# Patient Record
Sex: Male | Born: 1975 | Race: White | Hispanic: No | Marital: Married | Smoking: Never smoker
Health system: Southern US, Community
[De-identification: ages and names within clinical notes are randomized; demographics above are authoritative.]

## PROBLEM LIST (undated history)

## (undated) DIAGNOSIS — E119 Type 2 diabetes mellitus without complications: Secondary | ICD-10-CM

## (undated) DIAGNOSIS — I10 Essential (primary) hypertension: Secondary | ICD-10-CM

## (undated) DIAGNOSIS — K219 Gastro-esophageal reflux disease without esophagitis: Secondary | ICD-10-CM

## (undated) DIAGNOSIS — R51 Headache: Secondary | ICD-10-CM

## (undated) DIAGNOSIS — S46211A Strain of muscle, fascia and tendon of other parts of biceps, right arm, initial encounter: Secondary | ICD-10-CM

## (undated) DIAGNOSIS — M1990 Unspecified osteoarthritis, unspecified site: Secondary | ICD-10-CM

## (undated) HISTORY — DX: Gastro-esophageal reflux disease without esophagitis: K21.9

## (undated) HISTORY — PX: ELBOW SURGERY: SHX618

## (undated) HISTORY — DX: Type 2 diabetes mellitus without complications: E11.9

## (undated) HISTORY — DX: Headache: R51

## (undated) HISTORY — DX: Essential (primary) hypertension: I10

## (undated) HISTORY — DX: Strain of muscle, fascia and tendon of other parts of biceps, right arm, initial encounter: S46.211A

## (undated) HISTORY — PX: VASECTOMY: SHX75

---

## 1999-01-29 ENCOUNTER — Emergency Department (HOSPITAL_COMMUNITY): Admission: EM | Admit: 1999-01-29 | Discharge: 1999-01-29 | Payer: Self-pay | Admitting: Internal Medicine

## 1999-02-03 ENCOUNTER — Emergency Department (HOSPITAL_COMMUNITY): Admission: EM | Admit: 1999-02-03 | Discharge: 1999-02-03 | Payer: Self-pay | Admitting: Emergency Medicine

## 1999-10-03 ENCOUNTER — Encounter: Payer: Self-pay | Admitting: Emergency Medicine

## 1999-10-03 ENCOUNTER — Inpatient Hospital Stay (HOSPITAL_COMMUNITY): Admission: EM | Admit: 1999-10-03 | Discharge: 1999-10-06 | Payer: Self-pay | Admitting: Emergency Medicine

## 1999-10-11 ENCOUNTER — Inpatient Hospital Stay (HOSPITAL_COMMUNITY): Admission: EM | Admit: 1999-10-11 | Discharge: 1999-10-13 | Payer: Self-pay | Admitting: *Deleted

## 1999-10-12 ENCOUNTER — Encounter: Payer: Self-pay | Admitting: Pulmonary Disease

## 2001-04-15 ENCOUNTER — Emergency Department (HOSPITAL_COMMUNITY)
Admission: EM | Admit: 2001-04-15 | Discharge: 2001-04-15 | Payer: Self-pay | Admitting: Thoracic Surgery (Cardiothoracic Vascular Surgery)

## 2001-09-07 ENCOUNTER — Emergency Department (HOSPITAL_COMMUNITY): Admission: EM | Admit: 2001-09-07 | Discharge: 2001-09-07 | Payer: Self-pay | Admitting: Emergency Medicine

## 2004-10-11 ENCOUNTER — Ambulatory Visit: Payer: Self-pay | Admitting: Family Medicine

## 2004-12-20 ENCOUNTER — Ambulatory Visit: Payer: Self-pay | Admitting: Internal Medicine

## 2005-12-26 ENCOUNTER — Ambulatory Visit: Payer: Self-pay | Admitting: Internal Medicine

## 2007-04-07 ENCOUNTER — Ambulatory Visit: Payer: Self-pay | Admitting: Internal Medicine

## 2007-04-07 ENCOUNTER — Telehealth (INDEPENDENT_AMBULATORY_CARE_PROVIDER_SITE_OTHER): Payer: Self-pay | Admitting: *Deleted

## 2007-04-07 DIAGNOSIS — K219 Gastro-esophageal reflux disease without esophagitis: Secondary | ICD-10-CM

## 2007-04-07 DIAGNOSIS — I10 Essential (primary) hypertension: Secondary | ICD-10-CM | POA: Insufficient documentation

## 2007-04-07 LAB — CONVERTED CEMR LAB: Rapid Strep: POSITIVE

## 2007-06-16 ENCOUNTER — Ambulatory Visit: Payer: Self-pay | Admitting: Internal Medicine

## 2007-06-16 DIAGNOSIS — R51 Headache: Secondary | ICD-10-CM

## 2007-06-16 DIAGNOSIS — R519 Headache, unspecified: Secondary | ICD-10-CM | POA: Insufficient documentation

## 2007-06-19 ENCOUNTER — Ambulatory Visit: Payer: Self-pay | Admitting: Internal Medicine

## 2007-06-19 ENCOUNTER — Telehealth (INDEPENDENT_AMBULATORY_CARE_PROVIDER_SITE_OTHER): Payer: Self-pay | Admitting: *Deleted

## 2007-07-01 LAB — CONVERTED CEMR LAB
ALT: 36 units/L (ref 0–53)
AST: 27 units/L (ref 0–37)
BUN: 18 mg/dL (ref 6–23)
Basophils Absolute: 0 10*3/uL (ref 0.0–0.1)
Cholesterol: 206 mg/dL (ref 0–200)
GFR calc Af Amer: 99 mL/min
Hemoglobin: 15.5 g/dL (ref 13.0–17.0)
Lymphocytes Relative: 40.1 % (ref 12.0–46.0)
MCHC: 35.1 g/dL (ref 30.0–36.0)
Monocytes Absolute: 0.7 10*3/uL (ref 0.2–0.7)
Monocytes Relative: 13.4 % — ABNORMAL HIGH (ref 3.0–11.0)
Neutro Abs: 2.1 10*3/uL (ref 1.4–7.7)
Potassium: 4 meq/L (ref 3.5–5.1)

## 2007-10-12 ENCOUNTER — Telehealth (INDEPENDENT_AMBULATORY_CARE_PROVIDER_SITE_OTHER): Payer: Self-pay | Admitting: *Deleted

## 2007-10-27 ENCOUNTER — Ambulatory Visit: Payer: Self-pay | Admitting: Internal Medicine

## 2007-11-06 ENCOUNTER — Emergency Department (HOSPITAL_COMMUNITY): Admission: EM | Admit: 2007-11-06 | Discharge: 2007-11-06 | Payer: Self-pay | Admitting: Emergency Medicine

## 2007-11-10 ENCOUNTER — Encounter (INDEPENDENT_AMBULATORY_CARE_PROVIDER_SITE_OTHER): Payer: Self-pay | Admitting: *Deleted

## 2007-11-23 ENCOUNTER — Telehealth (INDEPENDENT_AMBULATORY_CARE_PROVIDER_SITE_OTHER): Payer: Self-pay | Admitting: *Deleted

## 2008-02-01 ENCOUNTER — Encounter (INDEPENDENT_AMBULATORY_CARE_PROVIDER_SITE_OTHER): Payer: Self-pay | Admitting: *Deleted

## 2008-03-19 ENCOUNTER — Emergency Department (HOSPITAL_COMMUNITY): Admission: EM | Admit: 2008-03-19 | Discharge: 2008-03-19 | Payer: Self-pay | Admitting: Emergency Medicine

## 2008-07-04 ENCOUNTER — Telehealth (INDEPENDENT_AMBULATORY_CARE_PROVIDER_SITE_OTHER): Payer: Self-pay | Admitting: *Deleted

## 2008-07-05 ENCOUNTER — Encounter (INDEPENDENT_AMBULATORY_CARE_PROVIDER_SITE_OTHER): Payer: Self-pay | Admitting: *Deleted

## 2008-07-29 HISTORY — PX: HEMORRHOID SURGERY: SHX153

## 2008-08-08 ENCOUNTER — Ambulatory Visit: Payer: Self-pay | Admitting: Family Medicine

## 2008-09-06 ENCOUNTER — Ambulatory Visit: Payer: Self-pay | Admitting: Internal Medicine

## 2008-09-12 ENCOUNTER — Telehealth: Payer: Self-pay | Admitting: Internal Medicine

## 2008-09-26 ENCOUNTER — Encounter (INDEPENDENT_AMBULATORY_CARE_PROVIDER_SITE_OTHER): Payer: Self-pay | Admitting: *Deleted

## 2009-01-30 ENCOUNTER — Observation Stay (HOSPITAL_COMMUNITY): Admission: EM | Admit: 2009-01-30 | Discharge: 2009-01-31 | Payer: Self-pay | Admitting: Emergency Medicine

## 2009-02-01 ENCOUNTER — Telehealth: Payer: Self-pay | Admitting: Internal Medicine

## 2009-02-02 ENCOUNTER — Encounter (INDEPENDENT_AMBULATORY_CARE_PROVIDER_SITE_OTHER): Payer: Self-pay | Admitting: *Deleted

## 2009-02-02 ENCOUNTER — Telehealth (INDEPENDENT_AMBULATORY_CARE_PROVIDER_SITE_OTHER): Payer: Self-pay | Admitting: *Deleted

## 2009-02-02 ENCOUNTER — Telehealth: Payer: Self-pay | Admitting: Internal Medicine

## 2009-02-06 ENCOUNTER — Ambulatory Visit: Payer: Self-pay

## 2009-02-06 ENCOUNTER — Encounter: Payer: Self-pay | Admitting: Cardiovascular Disease

## 2009-04-04 ENCOUNTER — Telehealth (INDEPENDENT_AMBULATORY_CARE_PROVIDER_SITE_OTHER): Payer: Self-pay | Admitting: *Deleted

## 2009-04-10 ENCOUNTER — Encounter (INDEPENDENT_AMBULATORY_CARE_PROVIDER_SITE_OTHER): Payer: Self-pay | Admitting: *Deleted

## 2009-05-09 ENCOUNTER — Ambulatory Visit: Payer: Self-pay | Admitting: Internal Medicine

## 2009-08-09 ENCOUNTER — Telehealth: Payer: Self-pay | Admitting: Internal Medicine

## 2009-11-07 ENCOUNTER — Ambulatory Visit: Payer: Self-pay | Admitting: Internal Medicine

## 2009-11-07 ENCOUNTER — Encounter (INDEPENDENT_AMBULATORY_CARE_PROVIDER_SITE_OTHER): Payer: Self-pay | Admitting: *Deleted

## 2009-12-26 ENCOUNTER — Telehealth (INDEPENDENT_AMBULATORY_CARE_PROVIDER_SITE_OTHER): Payer: Self-pay | Admitting: *Deleted

## 2009-12-27 ENCOUNTER — Encounter (INDEPENDENT_AMBULATORY_CARE_PROVIDER_SITE_OTHER): Payer: Self-pay | Admitting: *Deleted

## 2010-01-18 ENCOUNTER — Telehealth (INDEPENDENT_AMBULATORY_CARE_PROVIDER_SITE_OTHER): Payer: Self-pay | Admitting: *Deleted

## 2010-03-12 ENCOUNTER — Telehealth: Payer: Self-pay | Admitting: Internal Medicine

## 2010-03-13 ENCOUNTER — Encounter (INDEPENDENT_AMBULATORY_CARE_PROVIDER_SITE_OTHER): Payer: Self-pay | Admitting: *Deleted

## 2010-04-18 ENCOUNTER — Ambulatory Visit: Payer: Self-pay | Admitting: Internal Medicine

## 2010-04-19 ENCOUNTER — Ambulatory Visit: Payer: Self-pay | Admitting: Internal Medicine

## 2010-04-19 LAB — CONVERTED CEMR LAB
AST: 23 units/L (ref 0–37)
Basophils Absolute: 0.1 10*3/uL (ref 0.0–0.1)
CO2: 25 meq/L (ref 19–32)
Calcium: 9.5 mg/dL (ref 8.4–10.5)
Creatinine, Ser: 1.1 mg/dL (ref 0.4–1.5)
Direct LDL: 156.3 mg/dL
Eosinophils Absolute: 0.2 10*3/uL (ref 0.0–0.7)
Glucose, Bld: 106 mg/dL — ABNORMAL HIGH (ref 70–99)
Hemoglobin: 14.9 g/dL (ref 13.0–17.0)
Lymphocytes Relative: 37.5 % (ref 12.0–46.0)
MCHC: 34.5 g/dL (ref 30.0–36.0)
MCV: 91.1 fL (ref 78.0–100.0)
Monocytes Absolute: 0.7 10*3/uL (ref 0.1–1.0)
Neutro Abs: 2.4 10*3/uL (ref 1.4–7.7)
RDW: 12.9 % (ref 11.5–14.6)
Sodium: 143 meq/L (ref 135–145)
Total CHOL/HDL Ratio: 7
VLDL: 69 mg/dL — ABNORMAL HIGH (ref 0.0–40.0)

## 2010-04-23 ENCOUNTER — Telehealth: Payer: Self-pay | Admitting: Internal Medicine

## 2010-06-18 ENCOUNTER — Telehealth (INDEPENDENT_AMBULATORY_CARE_PROVIDER_SITE_OTHER): Payer: Self-pay | Admitting: *Deleted

## 2010-07-16 ENCOUNTER — Ambulatory Visit: Payer: Self-pay | Admitting: Internal Medicine

## 2010-07-26 ENCOUNTER — Telehealth (INDEPENDENT_AMBULATORY_CARE_PROVIDER_SITE_OTHER): Payer: Self-pay | Admitting: *Deleted

## 2010-08-01 ENCOUNTER — Ambulatory Visit: Admit: 2010-08-01 | Payer: Self-pay | Admitting: Internal Medicine

## 2010-08-30 NOTE — Progress Notes (Signed)
Summary: due for labs   Phone Note Outgoing Call   Summary of Call: due for labs: A1C, dx hyperglycemia FLP, dx hyperlipidemia THS, free T3 and T4 , dx hypothyroidism please arrange Jose E. Paz MD  July 26, 2010 2:16 PM   Follow-up for Phone Call        Patient is coming in on 1.4.12. Follow-up by: Harold Barban,  July 26, 2010 3:14 PM

## 2010-08-30 NOTE — Progress Notes (Signed)
Summary: refill  Phone Note Refill Request Call back at [phone] [facility] Message from:  Patient on January 18, 2010 3:41 PM  Refills Requested: Medication #1:  METOPROLOL SUCCINATE 50 MG XR24H-TAB 1 by mouth once daily [facility] --Patient wife called for refil - aware he needs appt    Initial call taken by: Okey Regal Spring,  January 18, 2010 3:43 PM    New/Updated Medications: METOPROLOL SUCCINATE 50 MG XR24H-TAB (METOPROLOL SUCCINATE) 1 by mouth once daily*OFFICE VISIT DUE NOW** Prescriptions: METOPROLOL SUCCINATE 50 MG XR24H-TAB (METOPROLOL SUCCINATE) 1 by mouth once daily*OFFICE VISIT DUE NOW**  #30 x 0   Entered by:   Jeremy Johann CMA   Authorized by:   Nolon Rod. Paz MD   Signed by:   Jeremy Johann CMA on 01/18/2010   Method used:   Faxed to ...       [facility] (retail)       [address]       [place], [place]  [address]       Ph: [phone] or [phone]       Fax: [phone]   RxID:   [id number]

## 2010-08-30 NOTE — Progress Notes (Signed)
Summary: due cpx   Phone Note Outgoing Call Call back at [facility] Phone [phone] Call back at Work Phone [phone]   Summary of Call: due cpx ....Marland KitchenMarland KitchenShary Decamp  Dec 26, 2009 2:38 PM   Follow-up for Phone Call        lmtcb.Harold Barban  Dec 26, 2009 4:33 PM  Additional Follow-up for Phone Call Additional follow up Details #1::        Patient is unreachable by phone...mailed letter to patient. Additional Follow-up by: Harold Barban,  December 27, 2009 2:05 PM

## 2010-08-30 NOTE — Letter (Signed)
Summary: Primary Care Appointment Letter  [facility] [place]  [address] [place], [place] [address]   Phone: [phone]  Fax: [phone]    12/27/2009 MRN: [id number]  Garrett Bell [address] [place], [place]  [address]  Dear Garrett Bell,   Your Primary Care Physician Oneonta E. Paz MD has indicated that:    ____x___it is time to schedule an appointment. (physcial)    _______you missed your appointment on______ and need to call and          reschedule.    _______you need to have lab work done.    _______you need to schedule an appointment discuss lab or test results.    _______you need to call to reschedule your appointment that is                       scheduled on _________.     Please call our office as soon as possible. Our phone number is [phone]. Please press option 1. Our office is open 8a-12noon and 1p-5p, Monday through Friday.     Thank you,    [facility]

## 2010-08-30 NOTE — Letter (Signed)
Summary: Primary Care Appointment Letter  [facility] [place]  [address] [place], [place] [address]   Phone: [phone]  Fax: [phone]    03/13/2010 MRN: [id number]  Garrett Bell [address] [place], [place]  [address]  Dear [name],   Your Primary Care Physician [place] E. Paz MD has indicated that:    ____X___it is time to schedule an appointment. (We have been trying to contact you, please call our office.)     _______you missed your appointment on______ and need to call and          reschedule.    _______you need to have lab work done.    _______you need to schedule an appointment discuss lab or test results.    _______you need to call to reschedule your appointment that is                       scheduled on _________.     Please call our office as soon as possible. Our phone number is [phone]          _________. Please press option 1. Our office is open 8a-12noon and 1p-5p, Monday through Friday.     Thank you,    Army Fossa CMA  March 13, 2010 3:10 PM

## 2010-08-30 NOTE — Progress Notes (Signed)
Summary: mail order Benazepril and Metoprolol  Phone Note Refill Request Call back at [phone]=new number (dont know what it replaces) Message from:  Patient on June 18, 2010 12:58 PM  Refills Requested: Medication #1:  BENAZEPRIL HCL 40 MG TABS 1 by mouth once daily - DUE OFFICE VISIT BEFORE ADDITIONAL REFILLS.  Medication #2:  METOPROLOL SUCCINATE 50 MG XR24H-TAB 1 by mouth once daily*OFFICE VISIT DUE NOW**. changing to mail order pharmacy--needs these paper prescription so he can send them in with her "first time" paperwork---needs 90 day prescriptions plus refills; please call when ready for pickup  Next Appointment Scheduled: labs=07/16/2010; Paz = 10/16/2010 Initial call taken by: Jerolyn Shin,  June 18, 2010 1:00 PM  Follow-up for Phone Call        aware that rx's are ready. Follow-up by: Army Fossa CMA,  June 18, 2010 2:36 PM    New/Updated Medications: BENAZEPRIL HCL 40 MG TABS (BENAZEPRIL HCL) 1 by mouth once daily. METOPROLOL SUCCINATE 50 MG XR24H-TAB (METOPROLOL SUCCINATE) 1 by mouth once daily Prescriptions: METOPROLOL SUCCINATE 50 MG XR24H-TAB (METOPROLOL SUCCINATE) 1 by mouth once daily  #90 x 1   Entered by:   Army Fossa CMA   Authorized by:   Nolon Rod. Paz MD   Signed by:   Army Fossa CMA on 06/18/2010   Method used:   Print then Give to Patient   RxID:   [id number] BENAZEPRIL HCL 40 MG TABS (BENAZEPRIL HCL) 1 by mouth once daily.  #90 x 1   Entered by:   Army Fossa CMA   Authorized by:   Nolon Rod. Paz MD   Signed by:   Army Fossa CMA on 06/18/2010   Method used:   Print then Give to Patient   RxID:   [id number]

## 2010-08-30 NOTE — Letter (Signed)
Summary: [place] No Show Letter  [facility] [place]  [address] [place], [place] [address]   Phone: [phone]  Fax: [phone]    11/07/2009 MRN: [id number]  Garrett Bell [address] [place], [place]  [address]   Dear [name],   Our records indicate that you missed your scheduled appointment with Dr. Drue Novel on 11/08/09.  Please contact this office to reschedule your appointment as soon as possible.  It is important that you keep your scheduled appointments with your physician, so we can provide you the best care possible.  Please be advised that there may be a charge for "no show" appointments.    Sincerely,   [place] [facility]

## 2010-08-30 NOTE — Assessment & Plan Note (Signed)
Summary: cpx//lch   Vital Signs:  Patient profile:   35 year old male Height:      70 inches Weight:      267 pounds BMI:     38.45 Pulse rate:   81 / minute Pulse rhythm:   regular BP sitting:   130 / 82  (left arm) Cuff size:   large  Vitals Entered By: Army Fossa CMA (April 18, 2010 1:48 PM) CC: CPX, not fasting , no concerns Comments Karin Golden on [address]   History of Present Illness: CPX  Current Medications (verified): 1)  Nexium 40 Mg  Cpdr (Esomeprazole Magnesium) .Marland Kitchen.. 1 By Mouth Qd 2)  Benazepril Hcl 40 Mg Tabs (Benazepril Hcl) .Marland Kitchen.. 1 By Mouth Once Daily - Due Office Visit Before Additional Refills. 3)  Metoprolol Succinate 50 Mg Xr24h-Tab (Metoprolol Succinate) .Marland Kitchen.. 1 By Mouth Once Daily*office Visit Due Now** 4)  Pantoprazole Sodium 40 Mg Tbec (Pantoprazole Sodium) .Marland Kitchen.. 1 By Mouth Once Daily  Allergies: 1)  ! * Hctz 2)  ! Sulfa  Past History:  Past Medical History: Reviewed history from 05/09/2009 and no changes required. Hypertension GERD Headache chest pain: Negative stress test 01/2009  Past Surgical History: Reviewed history from 09/06/2008 and no changes required. Vasectomy thrombosed hemorrhoid 07-2008  Social History: divorced 2011 5 children Occupation: Editor, commissioning  Tobacco--no ETOH-- never  diet-- regular exercise--active at work, no routine exercise   Review of Systems CV:  Denies chest pain or discomfort and swelling of feet. Resp:  Denies cough and shortness of breath. GI:  Denies bloody stools, diarrhea, nausea, and vomiting. GU:  Denies dysuria, hematuria, urinary frequency, and urinary hesitancy. Psych:  Denies anxiety and depression.  Physical Exam  General:  alert, well-developed, and overweight-appearing.   Neck:  no masses and no thyromegaly.   Lungs:  normal respiratory effort, no intercostal retractions, no accessory muscle use, and normal breath sounds.   Heart:  normal rate, regular rhythm, no  murmur, and no gallop.   Abdomen:  soft, non-tender, no distention, no masses, no guarding, and no rigidity.   Extremities:  no pretibial edema bilaterally  Psych:  Oriented X3, memory intact for recent and remote, normally interactive, good eye contact, not anxious appearing, and not depressed appearing.     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 1 by mouth qd 2)  Benazepril Hcl 40 Mg Tabs (Benazepril hcl) .Marland Kitchen.. 1 by mouth once daily - due office visit before additional refills. 3)  Metoprolol Succinate 50 Mg Xr24h-tab (Metoprolol succinate) .Marland Kitchen.. 1 by mouth once daily*office visit due now**  Patient Instructions: 1)  came back fasting: 2)  FLP BMP CBC TSH AST ALT ---dx V70 3)  Please schedule a follow-up appointment in 6 months .  Prescriptions: METOPROLOL SUCCINATE 50 MG XR24H-TAB (METOPROLOL SUCCINATE) 1 by mouth once daily*OFFICE VISIT DUE NOW**  #30 x 5   Entered by:   Army Fossa CMA   Authorized by:   Nolon Rod. Paz MD  Signed by:   Army Fossa CMA on 04/18/2010   Method used:   Electronically to        [facility] [address] [address] (retail)       [address]       [place], [place]  [address]       Ph: [phone] or [phone]       Fax: [phone]   RxID:   [id number] BENAZEPRIL HCL 40 MG TABS (BENAZEPRIL HCL) 1 by mouth once daily - DUE OFFICE VISIT BEFORE ADDITIONAL REFILLS.  #30 Tablet x 5   Entered by:   Army Fossa CMA   Authorized by:   Nolon Rod. Paz MD    Signed by:   Army Fossa CMA on 04/18/2010   Method used:   Electronically to        [facility] [address] [address] (retail)       [address]       [place], [place]  [address]       Ph: [phone] or [phone]       Fax: [phone]   RxID:   [id number] NEXIUM 40 MG  CPDR (ESOMEPRAZOLE MAGNESIUM) 1 by mouth qd  #30 x 6   Entered by:   Army Fossa CMA   Authorized by:   Nolon Rod. Paz MD   Signed by:   Army Fossa CMA on 04/18/2010   Method used:   Electronically to        [facility] [address] [address] (retail)       [address]       [place], [place]  [address]       Ph: [phone] or [phone]       Fax: [phone]   RxID:   [id number]

## 2010-08-30 NOTE — Progress Notes (Signed)
Summary: change nexium  Phone Note Call from Patient   Summary of Call: Insurance will not cover nexium... need to change to something that has generic HT - [place] Initial call taken by: Shary Decamp,  August 09, 2009 1:56 PM  Follow-up for Phone Call        pantoprazole 40mg  1 a day or omeprazole 40mg  1 a day. whatever  is less expensive Yvana Samonte E. Shoaib Siefker MD  August 09, 2009 9:33 PM     New/Updated Medications: PANTOPRAZOLE SODIUM 40 MG TBEC (PANTOPRAZOLE SODIUM) 1 by mouth once daily Prescriptions: PANTOPRAZOLE SODIUM 40 MG TBEC (PANTOPRAZOLE SODIUM) 1 by mouth once daily  #30 x 6   Entered by:   Shary Decamp   Authorized by:   Nolon Rod. Reginal Wojcicki MD   Signed by:   Shary Decamp on 08/10/2009   Method used:   Electronically to        [facility] (retail)       [address]       [place], [place]  [address]       Ph: [phone] or [phone]       Fax: [phone]   RxID:   [id number]

## 2010-08-30 NOTE — Progress Notes (Signed)
Summary: Refill Request  Phone Note Refill Request Call back at [phone] Message from:  Pharmacy on Dec 26, 2009 9:59 AM  Refills Requested: Medication #1:  BENAZEPRIL HCL 40 MG TABS 1 by mouth once daily   Dosage confirmed as above?Dosage Confirmed   Supply Requested: 3 months   Last Refilled: 09/04/2009 Karin Golden on [address]  Next Appointment Scheduled: none Initial call taken by: Harold Barban,  Dec 26, 2009 9:59 AM    New/Updated Medications: BENAZEPRIL HCL 40 MG TABS (BENAZEPRIL HCL) 1 by mouth once daily - DUE OFFICE VISIT Prescriptions: BENAZEPRIL HCL 40 MG TABS (BENAZEPRIL HCL) 1 by mouth once daily - DUE OFFICE VISIT  #30 x 0   Entered by:   Shary Decamp   Authorized by:   Nolon Rod. Paz MD   Signed by:   Shary Decamp on 12/26/2009   Method used:   Electronically to        [facility]* (retail)       [address]       [place], [place]  [address]       Ph: [phone]       Fax: [phone]   RxID:   [id number] BENAZEPRIL HCL 40 MG TABS (BENAZEPRIL HCL) 1 by mouth once daily - DUE OFFICE VISIT  #30 x 0   Entered by:   Shary Decamp   Authorized by:   Nolon Rod. Paz MD   Signed by:   Shary Decamp on 12/26/2009   Method used:   Electronically to        [facility] (retail)       [address]       [place], [place]  [address]       Ph: [phone] or [phone]       Fax: [phone]   RxID:   [id number]

## 2010-08-30 NOTE — Progress Notes (Signed)
Summary: lab results   Phone Note Outgoing Call   Summary of Call: advise patient: cholesterol and sugar are slightly  elevated, the TSH is also out of range plan.. diet, exercise, offer him a nutritinist referal  RTC in 3 months for labs  only  A1C, dx hyperglycemia FLP, dx hyperlipidemia THS, free T3 and T4 , dx hypothyroidism Darrion Wyszynski E. Jaimey Franchini MD  April 23, 2010 11:20 AM   Follow-up for Phone Call        Pts phone does not take incoming calls at the time, will try tomorrow. Army Fossa CMA  April 23, 2010 11:30 AM    Additional Follow-up for Phone Call Additional follow up Details #1::        Patient is aware, will work on diet and exercise. Lab appt is 12.19.11.  Additional Follow-up by: Harold Barban,  April 24, 2010 10:45 AM

## 2010-08-30 NOTE — Progress Notes (Signed)
Summary: refill reqeust: ok 2 weeks, no further w/o OV   Phone Note Refill Request Call back at [phone] Message from:  Pharmacy on March 12, 2010 8:14 AM  Refills Requested: Medication #1:  METOPROLOL SUCCINATE 50 MG XR24H-TAB 1 by mouth once daily*OFFICE VISIT DUE NOW**   Dosage confirmed as above?Dosage Confirmed   Supply Requested: 1 month [facility] [address]  Next Appointment Scheduled: none Initial call taken by: Lavell Islam,  March 12, 2010 8:15 AM  Follow-up for Phone Call        call patient: -will RF 2 week suply only , no further RF w/o OV -document you called -if unable to contact, send a  letter [facility] E. Paz MD  March 13, 2010 2:20 PM   Additional Follow-up for Phone Call Additional follow up Details #1::        Tried to call pt,  he does not except incoming phone calls. Will mail pt a letter.  Additional Follow-up by: Army Fossa CMA,  March 13, 2010 3:09 PM    Prescriptions: METOPROLOL SUCCINATE 50 MG XR24H-TAB (METOPROLOL SUCCINATE) 1 by mouth once daily*OFFICE VISIT DUE NOW**  #15 x 0   Entered by:   Army Fossa CMA   Authorized by:   Nolon Rod. Paz MD   Signed by:   Army Fossa CMA on 03/13/2010   Method used:   Electronically to        [facility] [address] [address] (retail)       [address]       [place], [place]  [address]       Ph: [phone] or [phone]       Fax: [phone]   RxID:   [id number]

## 2010-10-16 ENCOUNTER — Ambulatory Visit: Payer: Self-pay | Admitting: Internal Medicine

## 2010-10-16 DIAGNOSIS — Z0289 Encounter for other administrative examinations: Secondary | ICD-10-CM

## 2010-10-25 ENCOUNTER — Other Ambulatory Visit: Payer: Self-pay | Admitting: Internal Medicine

## 2010-11-04 LAB — COMPREHENSIVE METABOLIC PANEL
ALT: 32 U/L (ref 0–53)
AST: 24 U/L (ref 0–37)
CO2: 24 mEq/L (ref 19–32)
Calcium: 8.7 mg/dL (ref 8.4–10.5)
Chloride: 101 mEq/L (ref 96–112)
Creatinine, Ser: 0.96 mg/dL (ref 0.4–1.5)
GFR calc non Af Amer: >60 mL/min (ref >60)
Glucose, Bld: 87 mg/dL (ref 70–99)
Total Bilirubin: 0.9 mg/dL (ref 0.3–1.2)

## 2010-11-04 LAB — CK TOTAL AND CKMB (NOT AT ARMC)
Total CK: 102 U/L (ref 7–232)
Total CK: 135 U/L (ref 7–232)

## 2010-11-04 LAB — CBC
HCT: 45.8 % (ref 39.0–52.0)
MCHC: 34 g/dL (ref 30.0–36.0)
MCV: 90.7 fL (ref 78.0–100.0)
Platelets: 142 10*3/uL — ABNORMAL LOW (ref 150–400)

## 2010-11-04 LAB — DIFFERENTIAL
Basophils Absolute: 0 10*3/uL (ref 0.0–0.1)
Eosinophils Absolute: 0.1 10*3/uL (ref 0.0–0.7)
Eosinophils Relative: 1 % (ref 0–5)
Lymphocytes Relative: 31 % (ref 12–46)
Lymphs Abs: 1.7 10*3/uL (ref 0.7–4.0)
Neutrophils Relative %: 57 % (ref 43–77)

## 2010-11-04 LAB — POCT CARDIAC MARKERS
CKMB, poc: 1 ng/mL (ref 1.0–8.0)
Troponin i, poc: <0.05 ng/mL (ref 0.00–0.09)
Troponin i, poc: <0.05 ng/mL (ref 0.00–0.09)

## 2010-11-04 LAB — LIPID PANEL
Cholesterol: 259 mg/dL — ABNORMAL HIGH (ref 0–200)
HDL: 38 mg/dL — ABNORMAL LOW (ref >39)
LDL Cholesterol: 191 mg/dL — ABNORMAL HIGH (ref 0–99)
Triglycerides: 151 mg/dL — ABNORMAL HIGH (ref <150)

## 2010-11-04 LAB — D-DIMER, QUANTITATIVE: D-Dimer, Quant: 0.22 ug/mL-FEU (ref 0.00–0.48)

## 2010-11-04 LAB — TROPONIN I
Troponin I: 0.01 ng/mL (ref 0.00–0.06)
Troponin I: 0.01 ng/mL (ref 0.00–0.06)

## 2010-11-24 ENCOUNTER — Other Ambulatory Visit: Payer: Self-pay | Admitting: Internal Medicine

## 2010-12-11 NOTE — Discharge Summary (Signed)
NAME:  Garrett Bell, Garrett Bell               ACCOUNT NO.:  [id number]   MEDICAL RECORD NO.:  [id number]          PATIENT TYPE:  OBV   LOCATION:  3714                         FACILITY:  [facility]   PHYSICIAN:  Lonia Blood, M.D.       DATE OF BIRTH:  23-Feb-1976   DATE OF ADMISSION:  01/30/2009  DATE OF DISCHARGE:  01/31/2009                               DISCHARGE SUMMARY   PRIMARY CARE PHYSICIAN:  Dr. Willow Ora.   DISCHARGE DIAGNOSES:  1. Angina.  2. Hypertension.  3. Hyperlipidemia.  4. Gastroesophageal reflux disease.   DISCHARGE MEDICATIONS:  1. Nexium 40 mg daily.  2. Benazepril 40 mg daily.  3. Toprol-XL 25 mg daily.  4. Aspirin 81 mg daily.  5. Nitroglycerin 0.4 mg sublingually as needed.   CONDITION ON DISCHARGE:  [name] discharged in good condition  without any recurrent chest pain while in the hospital.  He will follow  up with [facility] for stress test and with his primary care  physician to further address his hyperlipidemia.   PROCEDURES DURING THIS ADMISSION:  No procedures done.   CONSULTATIONS:  During this admission, no consultations obtained.   HISTORY AND PHYSICAL:  Refer dictated H and P done by Dr. Lavera Guise.   HOSPITAL COURSE:  [name] is a 35 year old gentleman with history of  hypertension, gastroesophageal reflux disease presented to the emergency  room with complaints of chest pain that sounded like typical angina.  The duration of his chest pain was less than a minute, and his EKG did  not show any ST-T changes or Q waves.  The patient was placed on  telemetry and observed for 24 hours without recurrence of any symptoms.  He was placed on aspirin and beta blocker and he will follow up for  stress test with [facility].  We also turned our attention  to the patient's risk factor profile and we checked fasting lipid panel  with measured LDL of 191.  The patient was instructed about diet and  exercise, and he will have a repeat fasting  lipid panel in the  outpatient setting  with consideration of starting statin especially if he is diagnosed with  coronary artery disease.  For the patient's hypertension, we have added  a beta blocker to his benazepril, and he will follow up with his primary  care physician for further titration.      Lonia Blood, M.D.  Electronically Signed     Lonia Blood, M.D.  Electronically Signed    SL/MEDQ  D:  01/31/2009  T:  01/31/2009  Job:  [id number]   cc:   Willow Ora, MD

## 2010-12-11 NOTE — H&P (Signed)
NAME:  REEVES, MUSICK               ACCOUNT NO.:  [id number]   MEDICAL RECORD NO.:  [id number]          PATIENT TYPE:  OBV   LOCATION:  3714                         FACILITY:  [facility]   PHYSICIAN:  Lonia Blood, M.D.       DATE OF BIRTH:  [birth year]   DATE OF ADMISSION:  01/30/2009  DATE OF DISCHARGE:                              HISTORY & PHYSICAL   PRIMARY CARE PHYSICIAN:  Willow Ora, MD   CHIEF COMPLAINT:  Angina.   HISTORY OF PRESENT ILLNESS:  Mr. Moorman is a 35 year old gentleman with  a history of hypertension, gastroesophageal reflux disease who was  driving to work this morning when he started experiencing 10/10 chest  pressure radiating to his left arm associated with sweating.  He pulled  over and came to the emergency room.  He was evaluated and his cardiac  enzymes and EKG were found to be nonacute, so he was referred for  further observation on the medicine service.  The patient denies any  family history of early coronary artery disease.  He does not smoke  cigarettes and he does not have any personal history of coronary artery  disease.   PAST MEDICAL HISTORY:  Hypertension and gastroesophageal reflux disease.   HOME MEDICATIONS:  Nexium and benazepril.   ALLERGIES:  HYDROCHLOROTHIAZIDE and SULFA.   SOCIAL HISTORY:  The patient is married, does not use any drugs,  alcohol, or cigarettes.   FAMILY HISTORY:  The patient's grandfather had an MI at age 59.  Father  is aged 25 and healthy, mother aged 82 and healthy.   REVIEW OF SYSTEMS:  As per HPI.  All other systems reviewed are  negative.   PHYSICAL EXAMINATION:  VITAL SIGNS:  Upon admission, temperature 98.4,  heart rate 91, respiratory rate 18, blood pressure 160/100, and  saturation 97% on room air.  GENERAL:  The patient is alert, oriented, in no acute distress.  HEAD:  Normocephalic and atraumatic.  EYES:  Pupils equal, round, and reactive to light and accommodation.  Extraocular movements intact.  THROAT:   Clear.  NECK:  Supple.  No JVD.  CHEST:  Clear to auscultation without wheezes, rhonchi, or crackles.  HEART:  Regular rate and rhythm without murmurs, rubs, or gallops.  ABDOMEN:  Soft, nontender, and nondistended.  Bowel sounds are present.  EXTREMITIES:  Lower extremity without edema.  SKIN:  Warm and dry without any suspicious-looking rashes.  NEUROLOGIC:  Cranial nerves II through XII are intact.  Strength 5/5 in  all 4 extremities.  Sensation intact.   LABORATORY VALUES ON ADMISSION:  Myoglobin 71.  Troponin less than 0.05.  D-dimer 0.22.   ASSESSMENT AND PLAN:  This is a 35 year old gentleman admitted with  angina.  He will be placed on observation.  He will have cardiac enzymes  checked every 6 hours x3.  He will have a fasting lipid panel for  further risk stratification.  I will continue his aspirin and the beta-  blocker, continue the ACE inhibitor and the Protonix.  Based on the  patient's troponins and fasting lipid panel, we will decide  on the  stress test as inpatient and outpatient.  If troponins become positive,  we will place him on anticoagulation and consult Cardiology emergently.      Lonia Blood, M.D.  Electronically Signed     Lonia Blood, M.D.  Electronically Signed    SL/MEDQ  D:  01/30/2009  T:  01/31/2009  Job:  [id number]   cc:   Willow Ora, MD

## 2010-12-22 ENCOUNTER — Other Ambulatory Visit: Payer: Self-pay | Admitting: Internal Medicine

## 2010-12-26 ENCOUNTER — Other Ambulatory Visit: Payer: Self-pay | Admitting: Internal Medicine

## 2010-12-26 MED ORDER — METOPROLOL SUCCINATE ER 50 MG PO TB24: ORAL_TABLET | ORAL | Status: DC

## 2010-12-26 NOTE — Telephone Encounter (Signed)
Meds sent to wrong pharm- resent.

## 2011-01-17 ENCOUNTER — Other Ambulatory Visit: Payer: Self-pay | Admitting: Internal Medicine

## 2011-01-18 ENCOUNTER — Encounter: Payer: Self-pay | Admitting: Internal Medicine

## 2011-01-18 ENCOUNTER — Telehealth: Payer: Self-pay | Admitting: *Deleted

## 2011-01-18 NOTE — Telephone Encounter (Signed)
Called work number [phone] ---disconnected or no longer in service;  Cell number ([phone] does not accept incoming calls; mailed letter today asking to call and make a followup appt  (also not sure that street name is spelled correctly)

## 2011-02-07 ENCOUNTER — Encounter: Payer: Self-pay | Admitting: Internal Medicine

## 2011-02-07 NOTE — Telephone Encounter (Signed)
Per 411 information--pt's phone number is [phone]---called--rang many times, no answer;  411 also gave address as [address], [place], [place]  [address]---[facility] letter requesting patient  1) to call and schedule a followup appt  And 2) to verify correct address and phone number

## 2011-02-14 ENCOUNTER — Encounter: Payer: Self-pay | Admitting: Internal Medicine

## 2011-02-14 NOTE — Telephone Encounter (Signed)
Patient has followup appt sched for Mon 02/18/11 at 2:15--also corrected address and phone number--system is now correct

## 2011-02-18 ENCOUNTER — Encounter: Payer: Self-pay | Admitting: Internal Medicine

## 2011-02-18 ENCOUNTER — Ambulatory Visit (INDEPENDENT_AMBULATORY_CARE_PROVIDER_SITE_OTHER): Payer: BC Managed Care – PPO | Admitting: Internal Medicine

## 2011-02-18 DIAGNOSIS — I10 Essential (primary) hypertension: Secondary | ICD-10-CM

## 2011-02-18 MED ORDER — METOPROLOL SUCCINATE ER 100 MG PO TB24: 100.0000 mg | ORAL_TABLET | Freq: Every day | ORAL | Status: DC

## 2011-02-18 NOTE — Patient Instructions (Signed)
Came back Friday for labs  A1C, dx hyperglycemia FLP, dx hyperlipidemia THS, free T3 and T4 , dx hypothyroidism We are increasing  toprol to 100mg  a day: Check the  blood pressure 2 or 3 times a week, be sure it is less than 140/85. If it is consistently higher, let me know

## 2011-02-18 NOTE — Assessment & Plan Note (Addendum)
No ambulatory blood pressures, good medication compliance. His BP was elevated today, I rechecked it with a large cuff and got the same reading as my nurse. Increase metoprolol. See instructions.

## 2011-02-18 NOTE — Progress Notes (Signed)
  Subjective:    Patient ID: Garrett Bell, male    DOB: [birth year], 35 y.o.   MRN: [id number]  HPI Routine office visit, feeling well  Past Medical History  Diagnosis Date  . HTN (hypertension)   . GERD (gastroesophageal reflux disease)   . Headache   . Chest pain     neg stress test 01/2009   Past Surgical History  Procedure Date  . Vasectomy   . Hemorrhoid surgery 07-2008    thrombosed     Review of Systems Good compliance with medications, has not check his BP in the ambulatory setting. Diet and physical activity has not changed much. Weight is stable. Chart reviewed, last year we noted his cholesterol and blood sugar to be slightly elevated, has not come back yet to get the blood work redone     Objective:   Physical Exam  Constitutional: He is oriented to person, place, and time. He appears well-developed and well-nourished.  Cardiovascular: Normal rate, regular rhythm and normal heart sounds.   No murmur heard. Pulmonary/Chest: Effort normal and breath sounds normal. No respiratory distress. He has no wheezes. He has no rales.  Musculoskeletal: He exhibits no edema.  Neurological: He is alert and oriented to person, place, and time.          Assessment & Plan:  Abnormal labs: Last year, his blood sugar and cholesterol were slightly elevated, TSH was also normal. Labs. See instructions

## 2011-02-21 ENCOUNTER — Other Ambulatory Visit: Payer: Self-pay | Admitting: Internal Medicine

## 2011-02-21 DIAGNOSIS — E039 Hypothyroidism, unspecified: Secondary | ICD-10-CM

## 2011-02-21 DIAGNOSIS — E785 Hyperlipidemia, unspecified: Secondary | ICD-10-CM

## 2011-02-22 ENCOUNTER — Other Ambulatory Visit (INDEPENDENT_AMBULATORY_CARE_PROVIDER_SITE_OTHER): Payer: BC Managed Care – PPO

## 2011-02-22 DIAGNOSIS — E785 Hyperlipidemia, unspecified: Secondary | ICD-10-CM

## 2011-02-22 DIAGNOSIS — E039 Hypothyroidism, unspecified: Secondary | ICD-10-CM

## 2011-02-22 LAB — T4, FREE: Free T4: 0.84 ng/dL (ref 0.60–1.60)

## 2011-02-22 LAB — LIPID PANEL
HDL: 36.1 mg/dL — ABNORMAL LOW (ref >39.00)
Total CHOL/HDL Ratio: 6
Triglycerides: 169 mg/dL — ABNORMAL HIGH (ref 0.0–149.0)

## 2011-02-22 LAB — TSH: TSH: 5.09 u[IU]/mL (ref 0.35–5.50)

## 2011-02-22 NOTE — Progress Notes (Signed)
Labs only

## 2011-02-26 ENCOUNTER — Telehealth: Payer: Self-pay | Admitting: *Deleted

## 2011-02-26 DIAGNOSIS — R739 Hyperglycemia, unspecified: Secondary | ICD-10-CM

## 2011-02-26 NOTE — Telephone Encounter (Signed)
Message left for patient to return my call.  

## 2011-02-26 NOTE — Telephone Encounter (Signed)
Message copied by Leanne Lovely on Tue Feb 26, 2011 10:06 AM ------      Message from: Willow Ora E      Created: Tue Feb 26, 2011  8:07 AM       Advise patient:      Has developed mild diabetes: treatment is a healthy diet, exercise; please refer to a nutritionist      Cholesterol slt better than before , thyroid ok      F/u 3 months as planned

## 2011-02-26 NOTE — Telephone Encounter (Signed)
Pt aware of labs  

## 2011-02-28 ENCOUNTER — Other Ambulatory Visit: Payer: Self-pay | Admitting: Internal Medicine

## 2011-02-28 NOTE — Telephone Encounter (Signed)
PHARM DID NOT RECEIVE.

## 2011-03-19 ENCOUNTER — Ambulatory Visit: Payer: BC Managed Care – PPO | Admitting: *Deleted

## 2011-05-21 ENCOUNTER — Ambulatory Visit: Payer: BC Managed Care – PPO | Admitting: Internal Medicine

## 2011-05-21 DIAGNOSIS — Z0289 Encounter for other administrative examinations: Secondary | ICD-10-CM

## 2011-07-12 ENCOUNTER — Other Ambulatory Visit: Payer: Self-pay | Admitting: Internal Medicine

## 2011-08-15 ENCOUNTER — Other Ambulatory Visit: Payer: Self-pay | Admitting: Internal Medicine

## 2011-08-16 ENCOUNTER — Other Ambulatory Visit: Payer: Self-pay | Admitting: Internal Medicine

## 2011-08-19 ENCOUNTER — Other Ambulatory Visit: Payer: Self-pay | Admitting: Internal Medicine

## 2011-08-20 ENCOUNTER — Encounter: Payer: Self-pay | Admitting: Internal Medicine

## 2011-08-20 ENCOUNTER — Ambulatory Visit (INDEPENDENT_AMBULATORY_CARE_PROVIDER_SITE_OTHER): Payer: BC Managed Care – PPO | Admitting: Internal Medicine

## 2011-08-20 DIAGNOSIS — R739 Hyperglycemia, unspecified: Secondary | ICD-10-CM | POA: Insufficient documentation

## 2011-08-20 DIAGNOSIS — E119 Type 2 diabetes mellitus without complications: Secondary | ICD-10-CM

## 2011-08-20 DIAGNOSIS — Z0000 Encounter for general adult medical examination without abnormal findings: Secondary | ICD-10-CM

## 2011-08-20 DIAGNOSIS — I10 Essential (primary) hypertension: Secondary | ICD-10-CM

## 2011-08-20 LAB — COMPREHENSIVE METABOLIC PANEL
ALT: 30 U/L (ref 0–53)
BUN: 18 mg/dL (ref 6–23)
CO2: 29 mEq/L (ref 19–32)
Calcium: 9 mg/dL (ref 8.4–10.5)
Chloride: 105 mEq/L (ref 96–112)
Creatinine, Ser: 1 mg/dL (ref 0.4–1.5)
GFR: 89.99 mL/min (ref >60.00)
Glucose, Bld: 91 mg/dL (ref 70–99)
Total Bilirubin: 0.7 mg/dL (ref 0.3–1.2)

## 2011-08-20 LAB — CBC WITH DIFFERENTIAL/PLATELET
Basophils Absolute: 0.1 10*3/uL (ref 0.0–0.1)
Eosinophils Relative: 3.5 % (ref 0.0–5.0)
HCT: 43.1 % (ref 39.0–52.0)
Hemoglobin: 14.6 g/dL (ref 13.0–17.0)
Lymphocytes Relative: 33.6 % (ref 12.0–46.0)
Lymphs Abs: 1.8 10*3/uL (ref 0.7–4.0)
Monocytes Relative: 10.2 % (ref 3.0–12.0)
Neutro Abs: 2.7 10*3/uL (ref 1.4–7.7)
RDW: 13.1 % (ref 11.5–14.6)
WBC: 5.2 10*3/uL (ref 4.5–10.5)

## 2011-08-20 LAB — LIPID PANEL
HDL: 34.4 mg/dL — ABNORMAL LOW (ref >39.00)
Total CHOL/HDL Ratio: 6
Triglycerides: 105 mg/dL (ref 0.0–149.0)
VLDL: 21 mg/dL (ref 0.0–40.0)

## 2011-08-20 LAB — TSH: TSH: 4.3 u[IU]/mL (ref 0.35–5.50)

## 2011-08-20 LAB — HEMOGLOBIN A1C: Hgb A1c MFr Bld: 6 % (ref 4.6–6.5)

## 2011-08-20 MED ORDER — BENAZEPRIL HCL 40 MG PO TABS: ORAL_TABLET | ORAL | Status: DC

## 2011-08-20 MED ORDER — METOPROLOL SUCCINATE ER 100 MG PO TB24: 100.0000 mg | ORAL_TABLET | Freq: Every day | ORAL | Status: DC

## 2011-08-20 NOTE — Assessment & Plan Note (Addendum)
New dx since 7-12 Diet discussed Referred to a nutrionist

## 2011-08-20 NOTE — Patient Instructions (Signed)
Diet! Exercise! Check the  blood pressure 2 or 3 times a week, be sure it is less than 140/85. If it is consistently higher, let me know

## 2011-08-20 NOTE — Assessment & Plan Note (Addendum)
BP elevated , ?amb BPs  See instructions  Increase metoprolol to 100 mg (apparently on 50 mg only)

## 2011-08-20 NOTE — Assessment & Plan Note (Addendum)
Td 2008 Had a  flu shot once, no adverse reaction, explained benefits ; declined  d/w pt diet-exercise labs

## 2011-08-20 NOTE — Progress Notes (Signed)
  Subjective:    Patient ID: Garrett Bell, male    DOB: Apr 14, 1976, 36 y.o.   MRN: [id number]  HPI CPX Patient now pain at the feet, R>L, usually in the mid-foot. no redness or swelling  Past Medical History: Diabetes, A1C 6.4 (2012) Hypertension GERD Headache chest pain: Negative stress test 01/2009  Past Surgical History: Vasectomy thrombosed hemorrhoid 07-2008  Social History: divorced 2011, 5 children, lives by himself Occupation: Editor, commissioning  Tobacco--no ETOH-- never  diet-- not healthy exercise--active at work, no routine exercise    Review of Systems  Constitutional: Negative for fever and fatigue.  Respiratory: Negative for cough and shortness of breath.   Cardiovascular: Negative for chest pain and leg swelling.  Gastrointestinal: Negative for abdominal pain and blood in stool.  Genitourinary: Negative for dysuria, hematuria and difficulty urinating.  Psychiatric/Behavioral:       No depression or anxiety        Objective:   Physical Exam  Constitutional: He is oriented to person, place, and time. He appears well-developed. No distress.  HENT:  Head: Normocephalic and atraumatic.  Neck: No thyromegaly present.  Cardiovascular: Normal rate, regular rhythm and normal heart sounds.   No murmur heard. Pulmonary/Chest: Effort normal and breath sounds normal. No respiratory distress. He has no wheezes.  Abdominal: Soft. Bowel sounds are normal. He exhibits no distension. There is no tenderness. There is no rebound and no guarding.  Musculoskeletal:       No edema, normal pedal pulses, feet without deformities. No evidence of gout on exam  Neurological: He is alert and oriented to person, place, and time.  Skin: He is not diaphoretic.  Psychiatric: He has a normal mood and affect. His behavior is normal. Judgment and thought content normal.      Assessment & Plan:

## 2011-08-23 ENCOUNTER — Encounter: Payer: Self-pay | Admitting: Internal Medicine

## 2011-09-10 ENCOUNTER — Other Ambulatory Visit: Payer: Self-pay | Admitting: Internal Medicine

## 2011-09-10 NOTE — Telephone Encounter (Signed)
Refill done.  

## 2011-11-18 ENCOUNTER — Ambulatory Visit: Payer: BC Managed Care – PPO | Admitting: Internal Medicine

## 2011-11-18 DIAGNOSIS — Z0289 Encounter for other administrative examinations: Secondary | ICD-10-CM

## 2012-05-09 ENCOUNTER — Other Ambulatory Visit: Payer: Self-pay | Admitting: Internal Medicine

## 2012-05-11 NOTE — Telephone Encounter (Signed)
Refill done.  

## 2012-08-05 ENCOUNTER — Other Ambulatory Visit: Payer: Self-pay | Admitting: Internal Medicine

## 2012-08-05 MED ORDER — METOPROLOL SUCCINATE ER 100 MG PO TB24: 100.0000 mg | ORAL_TABLET | Freq: Every day | ORAL | Status: DC

## 2012-08-05 NOTE — Telephone Encounter (Signed)
refill TOPROL-XL 100 MG TAKE 1 TABLET (100 MG TOTAL) BY MOUTH DAILY. #30 last fill 12.28.13 Hand  Written note: Pt was wanting to go to regular release if possible since on the #4.00 list XL is over $35.00  Last ov 1.22.13 V70, pt has no showed on the following appointments:  3.20.12, 8.21.12, 10.23.12 & 4.22.13

## 2012-08-05 NOTE — Telephone Encounter (Signed)
OK to fill regular release tablets? Please advise.

## 2012-08-05 NOTE — Telephone Encounter (Signed)
Call patient, schedule a routine office visit. Okay to refill #30 days only.

## 2012-08-05 NOTE — Telephone Encounter (Signed)
Discussed with pt, scheduled appt for 1.14.14, refill done.

## 2012-08-11 ENCOUNTER — Encounter: Payer: Self-pay | Admitting: Internal Medicine

## 2012-08-11 ENCOUNTER — Ambulatory Visit (INDEPENDENT_AMBULATORY_CARE_PROVIDER_SITE_OTHER): Payer: BC Managed Care – PPO | Admitting: Internal Medicine

## 2012-08-11 ENCOUNTER — Ambulatory Visit: Payer: BC Managed Care – PPO | Admitting: Family Medicine

## 2012-08-11 VITALS — BP 128/82 | HR 63 | Temp 98.0°F | Wt 269.0 lb

## 2012-08-11 DIAGNOSIS — I10 Essential (primary) hypertension: Secondary | ICD-10-CM

## 2012-08-11 DIAGNOSIS — R7309 Other abnormal glucose: Secondary | ICD-10-CM

## 2012-08-11 DIAGNOSIS — R7303 Prediabetes: Secondary | ICD-10-CM

## 2012-08-11 DIAGNOSIS — G5600 Carpal tunnel syndrome, unspecified upper limb: Secondary | ICD-10-CM

## 2012-08-11 MED ORDER — BENAZEPRIL HCL 40 MG PO TABS: ORAL_TABLET | ORAL | Status: DC

## 2012-08-11 MED ORDER — METOPROLOL SUCCINATE ER 100 MG PO TB24: 100.0000 mg | ORAL_TABLET | Freq: Every day | ORAL | Status: DC

## 2012-08-11 NOTE — Assessment & Plan Note (Signed)
Reports good medication compliance, BP today is very good, no ambulatory BPs. Plan: Schedule labs Refill meds Encouraged him to come back in 4-5 months for a physical.

## 2012-08-11 NOTE — Assessment & Plan Note (Signed)
Reports lifestyle has improved compared to last year, will check the A1c and a cholesterol panel.

## 2012-08-11 NOTE — Assessment & Plan Note (Signed)
Long history of having hand paresthesias, previously diagnosed with CTS, symptoms are getting progressively worse despite using splinters at night. Plan: Refer to orthopedic surgery

## 2012-08-11 NOTE — Patient Instructions (Addendum)
Please come back fasting for labs : BMP --- dx hypertension A1c, FLP--- dx DM --- Please schedule a complete physical 4-5 months from today

## 2012-08-11 NOTE — Progress Notes (Signed)
  Subjective:    Patient ID: Garrett Bell, male    DOB: 1976-03-16, 36 y.o.   MRN: [id number]  HPI Routine visit Hypertension, good medication compliance, not ambulatory BPs Borderline diabetes, reports that his lifestyle has improved compared to previous years. Long  history of carpal tunnel syndrome, using splinters at night consistently, despite that he continued having paresthesias and the radial side of his hands.  Past Medical History  Diagnosis Date  . HTN (hypertension)   . GERD (gastroesophageal reflux disease)   . Headache   . Chest pain     neg stress test 01/2009   Past Surgical History  Procedure Date  . Vasectomy   . Hemorrhoid surgery 07-2008    thrombosed   Review of Systems No chest pain or shortness of breath No nausea, vomiting, diarrhea. Denies any neck pain. He remains very active at work and uses his hands a lot.     Objective:   Physical Exam General -- alert, well-developed Lungs -- normal respiratory effort, no intercostal retractions, no accessory muscle use, and normal breath sounds.   Heart-- normal rate, regular rhythm, no murmur, and no gallop.   Extremities-- no pretibial edema bilaterally Psych-- Cognition and judgment appear intact. Alert and cooperative with normal attention span and concentration.  not anxious appearing and not depressed appearing.       Assessment & Plan:

## 2012-08-12 ENCOUNTER — Other Ambulatory Visit: Payer: BC Managed Care – PPO

## 2012-08-27 ENCOUNTER — Other Ambulatory Visit: Payer: Self-pay | Admitting: Internal Medicine

## 2012-08-28 NOTE — Telephone Encounter (Signed)
Called pt to verify pharmacy. Pt stated he would like to switch his pharmacy to [facility] on [address].  Re-sent refill.

## 2012-09-23 ENCOUNTER — Telehealth: Payer: Self-pay | Admitting: Internal Medicine

## 2012-09-23 MED ORDER — METOPROLOL TARTRATE 50 MG PO TABS: 50.0000 mg | ORAL_TABLET | Freq: Two times a day (BID) | ORAL | Status: DC

## 2012-09-23 NOTE — Telephone Encounter (Signed)
Change to metoprolol 50 mg one tablet twice a day. #180 and one refill

## 2012-09-23 NOTE — Telephone Encounter (Signed)
refill Medication change--pharmacy writes pts wants regular release of Toprol since it is on the $4.00 list. the XL will cost $35.00  Last wrt as: TOPROL-XL 100 MG Take 1 tablet (100 mg total) by mouth daily. 1.14.14 #90 wt/1-refill

## 2012-09-23 NOTE — Telephone Encounter (Signed)
Refill done.  

## 2012-09-23 NOTE — Telephone Encounter (Signed)
Ok to change toprol XL to regular release?

## 2013-05-10 ENCOUNTER — Encounter: Payer: Self-pay | Admitting: Internal Medicine

## 2013-05-10 ENCOUNTER — Ambulatory Visit (INDEPENDENT_AMBULATORY_CARE_PROVIDER_SITE_OTHER): Payer: Managed Care, Other (non HMO) | Admitting: Internal Medicine

## 2013-05-10 VITALS — BP 165/112 | HR 78 | Temp 98.4°F | Wt 269.6 lb

## 2013-05-10 DIAGNOSIS — R7309 Other abnormal glucose: Secondary | ICD-10-CM

## 2013-05-10 DIAGNOSIS — K219 Gastro-esophageal reflux disease without esophagitis: Secondary | ICD-10-CM

## 2013-05-10 DIAGNOSIS — N529 Male erectile dysfunction, unspecified: Secondary | ICD-10-CM | POA: Insufficient documentation

## 2013-05-10 DIAGNOSIS — R7303 Prediabetes: Secondary | ICD-10-CM

## 2013-05-10 DIAGNOSIS — I10 Essential (primary) hypertension: Secondary | ICD-10-CM

## 2013-05-10 MED ORDER — LOSARTAN POTASSIUM 100 MG PO TABS: 100.0000 mg | ORAL_TABLET | Freq: Every day | ORAL | Status: DC

## 2013-05-10 MED ORDER — SILDENAFIL CITRATE 100 MG PO TABS: 50.0000 mg | ORAL_TABLET | Freq: Every day | ORAL | Status: DC | PRN

## 2013-05-10 NOTE — Progress Notes (Signed)
  Subjective:    Patient ID: Garrett Bell, male    DOB: April 12, 1976, 37 y.o.   MRN: [id number]  HPI Routine office visit Hypertension-- self discontinue beta blockers and ACE inhibitors 4 days ago because erectile dysfunction, BP today elevated, ambulatory BP w/  medication has been consistently in the 130s/80. Erectile  dysfunction--2 years history of problems, has been taking medications for longer than that however when we switch metoprolol from the XR formulation to the regular formulation symptoms got worse; libido is normal GERD- in the last 2 weeks has changed his diet, has lost some weight and he feels better essentially asymptomatic. Still taking PPIs  Past Medical History  Diagnosis Date  . HTN (hypertension)   . GERD (gastroesophageal reflux disease)   . Headache(784.0)   . Chest pain     neg stress test 01/2009  . Diabetes     A1C 6.4 (2012)   Past Surgical History  Procedure Laterality Date  . Vasectomy    . Hemorrhoid surgery  07-2008    thrombosed   History   Social History  . Marital Status: Divorced    Spouse Name: N/A    Number of Children: 5  . Years of Education: N/A   Occupational History  . Patent examiner   Social History Main Topics  . Smoking status: Never Smoker   . Smokeless tobacco: Never Used  . Alcohol Use: No  . Drug Use: Not on file  . Sexual Activity: Not on file   Other Topics Concern  . Not on file   Social History Narrative   divorced 2011, 5 children, lives by himself                       Review of Systems No  CP, SOB, lower extremity edema. No claudication. (-) cough, sputum production No dysuria, gross hematuria, difficulty urinating   Some anxiety, depression      Objective:   Physical Exam BP 165/112  Pulse 78  Temp(Src) 98.4 F (36.9 C)  Wt 269 lb 9.6 oz (122.29 kg)  BMI 38.12 kg/m2  SpO2 96% General -- alert, well-developed, NAD.   Lungs -- normal respiratory effort, no intercostal  retractions, no accessory muscle use, and normal breath sounds.  Heart-- normal rate, regular rhythm, no murmur.  Abdomen-- Not distended, good bowel sounds,soft, non-tender.no bruit Extremities-- no pretibial edema bilaterally , normal femoral pulses B. Neurologic--  alert & oriented X3. Speech normal, gait normal, strength normal in all extremities.    Psych-- Cognition and judgment appear intact. Cooperative with normal attention span and concentration. No anxious appearing , no depressed appearing.      Assessment & Plan:

## 2013-05-10 NOTE — Assessment & Plan Note (Signed)
New problem. 2 years h/o ED, libido normal, no claudication. Some degree of performance anxiety related to sebaceous cyst in the scrotum, plans to talk with urology about it. Also self discontinue hypertension medication As he thought they were aggravating the problem. Plan: Discussed options, we agreed to try Viagra, side effects and how to use it discussed, prescription provided.

## 2013-05-10 NOTE — Assessment & Plan Note (Signed)
On PPIs, doing much better since he changed his diet 2 weeks ago

## 2013-05-10 NOTE — Assessment & Plan Note (Addendum)
Self dscontinue Lotensin and Metoprolol 4 days ago d/t ED, BPs while taking medications was great. He feels that BP meds were aggravated ED which was particularly worse with the switch from Toprol-XL to metoprolol. Plan: Start losartan, BMP in 3 weeks If not at goal, consider an other medications, maybe amlodipine or even Toprol-XL or bystolic

## 2013-05-10 NOTE — Assessment & Plan Note (Signed)
Due for labs, see instructions  

## 2013-05-10 NOTE — Patient Instructions (Addendum)
Take the medications as prescribed   Check the  blood pressure 2 or 3 times a   week be sure it is between 110/60 and 140/85. Ideal blood pressure is 120/80. If it is consistently higher or lower, let me know   Schedule labs in 2-3 weeks from today: BMP--- dx HTN A1C-- prediabetes  Next visit in 3 months, sooner if your blood pressure is not well-controlled.

## 2013-05-27 ENCOUNTER — Other Ambulatory Visit (INDEPENDENT_AMBULATORY_CARE_PROVIDER_SITE_OTHER): Payer: Managed Care, Other (non HMO)

## 2013-05-27 DIAGNOSIS — I10 Essential (primary) hypertension: Secondary | ICD-10-CM

## 2013-05-28 LAB — BASIC METABOLIC PANEL
Calcium: 9.7 mg/dL (ref 8.4–10.5)
Chloride: 100 mEq/L (ref 96–112)
Creatinine, Ser: 1 mg/dL (ref 0.4–1.5)

## 2013-05-31 ENCOUNTER — Other Ambulatory Visit: Payer: Self-pay | Admitting: *Deleted

## 2013-05-31 ENCOUNTER — Telehealth: Payer: Self-pay | Admitting: *Deleted

## 2013-05-31 MED ORDER — AMLODIPINE BESYLATE 10 MG PO TABS: 10.0000 mg | ORAL_TABLET | Freq: Every day | ORAL | Status: DC

## 2013-05-31 NOTE — Telephone Encounter (Signed)
Pt states stopped taking b/p med. Pt felt the b/p med made him feel worse and requesting to try something else . Pt has not checked his b/p lately but states can feel when b/p is getting worse.Marland Kitchen DJR

## 2013-05-31 NOTE — Telephone Encounter (Signed)
Recommend the following: Change to Amlodipine 10 mg one by mouth daily, #30 and 3 refills. Check BPs 3 times a week, call with readings in 3 weeks.

## 2013-05-31 NOTE — Telephone Encounter (Signed)
Pt notified. DJR  

## 2013-05-31 NOTE — Addendum Note (Signed)
Addended by: Eustace Quail on: 05/31/2013 12:04 PM   Modules accepted: Orders

## 2013-05-31 NOTE — Telephone Encounter (Signed)
Message copied by Eustace Quail on Mon May 31, 2013 10:34 AM ------      Message from: Wanda Plump      Created: Sat May 29, 2013  3:59 PM       Onalee Hua, call the patient:      Blood work remains very good, is his blood pressure well controlled? Let me know. ------

## 2013-08-12 ENCOUNTER — Ambulatory Visit: Payer: Managed Care, Other (non HMO) | Admitting: Internal Medicine

## 2013-08-12 DIAGNOSIS — Z0289 Encounter for other administrative examinations: Secondary | ICD-10-CM

## 2013-08-22 ENCOUNTER — Other Ambulatory Visit: Payer: Self-pay | Admitting: Internal Medicine

## 2013-12-21 ENCOUNTER — Other Ambulatory Visit: Payer: Self-pay | Admitting: Internal Medicine

## 2013-12-24 ENCOUNTER — Encounter: Payer: Self-pay | Admitting: Internal Medicine

## 2013-12-24 ENCOUNTER — Ambulatory Visit (INDEPENDENT_AMBULATORY_CARE_PROVIDER_SITE_OTHER): Payer: Managed Care, Other (non HMO) | Admitting: Internal Medicine

## 2013-12-24 VITALS — BP 147/94 | HR 95 | Temp 98.3°F | Wt 286.0 lb

## 2013-12-24 DIAGNOSIS — L989 Disorder of the skin and subcutaneous tissue, unspecified: Secondary | ICD-10-CM

## 2013-12-24 DIAGNOSIS — R7303 Prediabetes: Secondary | ICD-10-CM

## 2013-12-24 DIAGNOSIS — N529 Male erectile dysfunction, unspecified: Secondary | ICD-10-CM

## 2013-12-24 DIAGNOSIS — I10 Essential (primary) hypertension: Secondary | ICD-10-CM

## 2013-12-24 DIAGNOSIS — R7309 Other abnormal glucose: Secondary | ICD-10-CM

## 2013-12-24 MED ORDER — BENAZEPRIL HCL 40 MG PO TABS: ORAL_TABLET | ORAL | Status: DC

## 2013-12-24 NOTE — Assessment & Plan Note (Addendum)
Resolved  after he discontinue amlodipine

## 2013-12-24 NOTE — Progress Notes (Signed)
Pre-visit discussion using our clinic review tool. No additional management support is needed unless otherwise documented below in the visit note.  

## 2013-12-24 NOTE — Assessment & Plan Note (Signed)
Has improved his diet, he remains active.  Labs,  see instructions

## 2013-12-24 NOTE — Progress Notes (Signed)
   Subjective:    Patient ID: Garrett Bell, male    DOB: January 07, 1976, 38 y.o.   MRN: [id number]  DOS:  12/24/2013 Type of  Visit: ROV  hypertension, self DC amlodipine due to the side effect, run out of   ACE inhibitor a few days ago. amb  BPs? Diabetes, improving his diet, he remains active at work A couple of skin lesions on the left leg concerned him, one of them is getting  darker  ROS Denies chest pain or difficulty breathing No nausea, vomiting, diarrhea  Past Medical History  Diagnosis Date  . HTN (hypertension)   . GERD (gastroesophageal reflux disease)   . Headache(784.0)   . Chest pain     neg stress test 01/2009  . Diabetes     A1C 6.4 (2012)    Past Surgical History  Procedure Laterality Date  . Vasectomy    . Hemorrhoid surgery  07-2008    thrombosed    History   Social History  . Marital Status: Divorced    Spouse Name: N/A    Number of Children: 5  . Years of Education: N/A   Occupational History  . Patent examiner   Social History Main Topics  . Smoking status: Never Smoker   . Smokeless tobacco: Never Used  . Alcohol Use: No  . Drug Use: Not on file  . Sexual Activity: Not on file   Other Topics Concern  . Not on file   Social History Narrative   divorced 2011, 5 children, lives by himself                           Medication List       This list is accurate as of: 12/24/13 11:59 PM.  Always use your most recent med list.               benazepril 40 MG tablet  Commonly known as:  LOTENSIN  Take one tablet by mouth daily.     co-enzyme Q-10 30 MG capsule  Take 30 mg by mouth 3 (three) times daily.     esomeprazole 40 MG capsule  Commonly known as:  NEXIUM  Take 40 mg by mouth daily.           Objective:   Physical Exam  Skin:      BP 147/94  Pulse 95  Temp(Src) 98.3 F (36.8 C) (Oral)  Wt 286 lb (129.729 kg)  SpO2 96% General -- alert, well-developed, NAD.   Lungs -- normal respiratory  effort, no intercostal retractions, no accessory muscle use, and normal breath sounds.  Heart-- normal rate, regular rhythm, no murmur.  Extremities-- no pretibial edema bilaterally   Neurologic--  alert & oriented X3. Speech normal, gait normal, strength normal in all extremities.   Psych-- Cognition and judgment appear intact. Cooperative with normal attention span and concentration. No anxious or depressed appearing.        Assessment & Plan:

## 2013-12-24 NOTE — Patient Instructions (Signed)
Come back fasting in 2 weeks: CMP--- dx  hypertension A1c, FLP-- dx  Hyperglycemia  Check the  blood pressure 2 or 3 times a month   be sure it is between 110/60 and 140/85. Ideal blood pressure is 120/80. If it is consistently higher or lower, let me know   Next visit is for a physical exam in 4 months , fasting Please make an appointment

## 2013-12-24 NOTE — Assessment & Plan Note (Signed)
Self  DC amlodipine due to to erectile dysfunction, out of benazepril x 4 days ago No ambulatory BPs Plan: Refill medications, ambulatory BPs, return to the office in 4 months. See instructions

## 2013-12-25 ENCOUNTER — Telehealth: Payer: Self-pay | Admitting: Internal Medicine

## 2013-12-25 DIAGNOSIS — L989 Disorder of the skin and subcutaneous tissue, unspecified: Secondary | ICD-10-CM | POA: Insufficient documentation

## 2013-12-25 NOTE — Assessment & Plan Note (Signed)
Skin lesion, Distal skin lesion left leg has been getting darker, refer to dermatology

## 2013-12-25 NOTE — Telephone Encounter (Signed)
Relevant patient education mailed to patient.  

## 2014-01-06 DIAGNOSIS — R7309 Other abnormal glucose: Secondary | ICD-10-CM

## 2014-01-06 DIAGNOSIS — I10 Essential (primary) hypertension: Secondary | ICD-10-CM

## 2014-01-07 LAB — COMPREHENSIVE METABOLIC PANEL
ALT: 36 U/L (ref 0–53)
AST: 29 U/L (ref 0–37)
Albumin: 4.1 g/dL (ref 3.5–5.2)
Alkaline Phosphatase: 40 U/L (ref 39–117)
BUN: 16 mg/dL (ref 6–23)
CALCIUM: 9.3 mg/dL (ref 8.4–10.5)
CHLORIDE: 105 meq/L (ref 96–112)
CO2: 28 mEq/L (ref 19–32)
Creatinine, Ser: 1 mg/dL (ref 0.4–1.5)
GFR: 89.93 mL/min (ref >60.00)
Glucose, Bld: 83 mg/dL (ref 70–99)
Potassium: 4.3 mEq/L (ref 3.5–5.1)
Sodium: 140 mEq/L (ref 135–145)
Total Bilirubin: 0.5 mg/dL (ref 0.2–1.2)
Total Protein: 7.3 g/dL (ref 6.0–8.3)

## 2014-01-07 LAB — LIPID PANEL
Cholesterol: 206 mg/dL — ABNORMAL HIGH (ref 0–200)
HDL: 34.3 mg/dL — ABNORMAL LOW (ref >39.00)
LDL Cholesterol: 147 mg/dL — ABNORMAL HIGH (ref 0–99)
NonHDL: 171.7
Total CHOL/HDL Ratio: 6
Triglycerides: 125 mg/dL (ref 0.0–149.0)
VLDL: 25 mg/dL (ref 0.0–40.0)

## 2014-01-07 LAB — HEMOGLOBIN A1C: Hgb A1c MFr Bld: 6 % (ref 4.6–6.5)

## 2014-01-12 ENCOUNTER — Encounter: Payer: Self-pay | Admitting: *Deleted

## 2014-05-05 ENCOUNTER — Ambulatory Visit: Payer: Managed Care, Other (non HMO) | Admitting: Internal Medicine

## 2014-05-23 ENCOUNTER — Encounter: Payer: Self-pay | Admitting: Internal Medicine

## 2014-05-23 ENCOUNTER — Ambulatory Visit (INDEPENDENT_AMBULATORY_CARE_PROVIDER_SITE_OTHER): Payer: Managed Care, Other (non HMO) | Admitting: Internal Medicine

## 2014-05-23 VITALS — BP 142/84 | HR 56 | Temp 97.8°F | Ht 70.0 in | Wt 261.0 lb

## 2014-05-23 DIAGNOSIS — R7303 Prediabetes: Secondary | ICD-10-CM

## 2014-05-23 DIAGNOSIS — L989 Disorder of the skin and subcutaneous tissue, unspecified: Secondary | ICD-10-CM

## 2014-05-23 DIAGNOSIS — Z0000 Encounter for general adult medical examination without abnormal findings: Secondary | ICD-10-CM

## 2014-05-23 DIAGNOSIS — R7309 Other abnormal glucose: Secondary | ICD-10-CM

## 2014-05-23 DIAGNOSIS — I10 Essential (primary) hypertension: Secondary | ICD-10-CM

## 2014-05-23 NOTE — Progress Notes (Signed)
Pre visit review using our clinic review tool, if applicable. No additional management support is needed unless otherwise documented below in the visit note. 

## 2014-05-23 NOTE — Assessment & Plan Note (Signed)
Good compliance w/  medication, reports ambulatory BPs are within normal. No change

## 2014-05-23 NOTE — Assessment & Plan Note (Signed)
Had to cancel his visit with dermatology, encouraged to call and reschedule

## 2014-05-23 NOTE — Patient Instructions (Signed)
Get your blood work before you leave   Please reschedule the visit to the dermatologist   Please come back to the office in 6 months  for a check up Come back fasting

## 2014-05-23 NOTE — Assessment & Plan Note (Addendum)
Td 2008  flu shot ---declined  d/w pt diet-exercise labs   ekg-- nsr

## 2014-05-23 NOTE — Progress Notes (Signed)
Subjective:    Patient ID: Garrett Bell, male    DOB: 1975-08-01, 38 y.o.   MRN: [id number]  DOS:  05/23/2014 Type of visit - description : cpx Interval history: feels well    ROS  Diet-- better, no sodas; more portion control Exercise-- active at work No  CP, SOB No palpitations  Denies  nausea, vomiting diarrhea, blood in the stools (-) cough, sputum production (-) wheezing, chest congestion No dysuria, gross hematuria, difficulty urinating  No anxiety, depression      Past Medical History  Diagnosis Date  . HTN (hypertension)   . GERD (gastroesophageal reflux disease)   . Headache(784.0)   . Chest pain     neg stress test 01/2009  . Diabetes     A1C 6.4 (2012)    Past Surgical History  Procedure Laterality Date  . Vasectomy    . Hemorrhoid surgery  07-2008    thrombosed    History   Social History  . Marital Status: Divorced    Spouse Name: N/A    Number of Children: 5  . Years of Education: N/A   Occupational History  . 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 Smoking status: Never Smoker   . Smokeless tobacco: Never Used  . Alcohol Use: No  . Drug Use: No  . Sexual Activity: Not on file   Other Topics Concern  . Not on file   Social History Narrative   divorced 2011, 5 children, household is pt, 3 of the children and fiancee                      Family History  Problem Relation Age of Onset  . Hypertension Father   . Heart attack Other     gm  . Stroke Other     GF  . Aneurysm Other     GM, brain  . Colon cancer Neg Hx   . Prostate cancer Neg Hx          Medication List       This list is accurate as of: 05/23/14 11:59 PM.  Always use your most recent med list.               benazepril 40 MG tablet  Commonly known as:  LOTENSIN  Take one tablet by mouth daily.     co-enzyme Q-10 30 MG capsule  Take 30 mg by mouth 3 (three) times daily.     esomeprazole 40 MG capsule  Commonly known as:  NEXIUM    Take 40 mg by mouth daily.           Objective:   Physical Exam BP 142/84  Pulse 56  Temp(Src) 97.8 F (36.6 C) (Oral)  Ht 5\' 10"  (1.778 m)  Wt 261 lb (118.389 kg)  BMI 37.45 kg/m2  SpO2 97%  General -- alert, well-developed, NAD.  Neck --no thyromegaly , normal carotid pulse  HEENT-- Not pale.  Lungs -- normal respiratory effort, no intercostal retractions, no accessory muscle use, and normal breath sounds.  Heart-- normal rate, regular rhythm, no murmur.  Abdomen-- Not distended, good bowel sounds,soft, non-tender. Extremities-- no pretibial edema bilaterally  Neurologic--  alert & oriented X3. Speech normal, gait appropriate for age, strength symmetric and appropriate for age.    Psych-- Cognition and judgment appear intact. Cooperative with normal attention span and concentration. No anxious or depressed appearing.       Assessment &  Plan:

## 2014-05-24 LAB — CBC WITH DIFFERENTIAL/PLATELET
Basophils Absolute: 0 10*3/uL (ref 0.0–0.1)
Basophils Relative: 0.4 % (ref 0.0–3.0)
Eosinophils Absolute: 0.2 10*3/uL (ref 0.0–0.7)
Eosinophils Relative: 2.5 % (ref 0.0–5.0)
HCT: 45.9 % (ref 39.0–52.0)
Hemoglobin: 14.9 g/dL (ref 13.0–17.0)
Lymphocytes Relative: 34.9 % (ref 12.0–46.0)
Lymphs Abs: 2.4 10*3/uL (ref 0.7–4.0)
MCHC: 32.5 g/dL (ref 30.0–36.0)
MCV: 91.7 fl (ref 78.0–100.0)
MONO ABS: 0.7 10*3/uL (ref 0.1–1.0)
Monocytes Relative: 10.5 % (ref 3.0–12.0)
Neutro Abs: 3.5 10*3/uL (ref 1.4–7.7)
Neutrophils Relative %: 51.7 % (ref 43.0–77.0)
PLATELETS: 155 10*3/uL (ref 150.0–400.0)
RBC: 5.01 Mil/uL (ref 4.22–5.81)
RDW: 13.4 % (ref 11.5–15.5)
WBC: 6.8 10*3/uL (ref 4.0–10.5)

## 2014-05-24 LAB — BASIC METABOLIC PANEL
BUN: 17 mg/dL (ref 6–23)
CO2: 28 meq/L (ref 19–32)
Calcium: 9.2 mg/dL (ref 8.4–10.5)
Chloride: 101 mEq/L (ref 96–112)
Creatinine, Ser: 1.1 mg/dL (ref 0.4–1.5)
GFR: 79.39 mL/min (ref >60.00)
Glucose, Bld: 64 mg/dL — ABNORMAL LOW (ref 70–99)
POTASSIUM: 3.7 meq/L (ref 3.5–5.1)
Sodium: 136 mEq/L (ref 135–145)

## 2014-05-24 LAB — HEMOGLOBIN A1C: HEMOGLOBIN A1C: 6 % (ref 4.6–6.5)

## 2014-05-24 LAB — TSH: TSH: 3.26 u[IU]/mL (ref 0.35–4.50)

## 2014-05-25 NOTE — Progress Notes (Signed)
This encounter was created in error - please disregard.

## 2014-11-14 ENCOUNTER — Telehealth: Payer: Self-pay | Admitting: Internal Medicine

## 2014-11-14 NOTE — Telephone Encounter (Signed)
Pre visit letter sent  °

## 2014-11-30 ENCOUNTER — Telehealth: Payer: Self-pay

## 2014-11-30 NOTE — Telephone Encounter (Signed)
LMTCB

## 2014-12-02 ENCOUNTER — Encounter: Payer: Managed Care, Other (non HMO) | Admitting: Internal Medicine

## 2014-12-23 ENCOUNTER — Other Ambulatory Visit: Payer: Self-pay

## 2014-12-23 DIAGNOSIS — I10 Essential (primary) hypertension: Secondary | ICD-10-CM

## 2014-12-23 MED ORDER — BENAZEPRIL HCL 40 MG PO TABS: 40.0000 mg | ORAL_TABLET | Freq: Every day | ORAL | Status: DC

## 2015-02-02 ENCOUNTER — Ambulatory Visit (INDEPENDENT_AMBULATORY_CARE_PROVIDER_SITE_OTHER): Payer: Managed Care, Other (non HMO) | Admitting: Internal Medicine

## 2015-02-02 ENCOUNTER — Encounter: Payer: Self-pay | Admitting: Internal Medicine

## 2015-02-02 VITALS — BP 126/74 | HR 74 | Temp 97.9°F | Ht 70.0 in | Wt 267.4 lb

## 2015-02-02 DIAGNOSIS — R7303 Prediabetes: Secondary | ICD-10-CM

## 2015-02-02 DIAGNOSIS — I10 Essential (primary) hypertension: Secondary | ICD-10-CM | POA: Diagnosis not present

## 2015-02-02 DIAGNOSIS — R7309 Other abnormal glucose: Secondary | ICD-10-CM

## 2015-02-02 LAB — BASIC METABOLIC PANEL
BUN: 20 mg/dL (ref 6–23)
CHLORIDE: 104 meq/L (ref 96–112)
CO2: 28 meq/L (ref 19–32)
Calcium: 9.4 mg/dL (ref 8.4–10.5)
Creatinine, Ser: 0.94 mg/dL (ref 0.40–1.50)
GFR: 93.86 mL/min (ref >60.00)
Glucose, Bld: 84 mg/dL (ref 70–99)
Potassium: 3.9 mEq/L (ref 3.5–5.1)
Sodium: 139 mEq/L (ref 135–145)

## 2015-02-02 LAB — HEMOGLOBIN A1C: Hgb A1c MFr Bld: 5.8 % (ref 4.6–6.5)

## 2015-02-02 NOTE — Progress Notes (Signed)
Pre visit review using our clinic review tool, if applicable. No additional management support is needed unless otherwise documented below in the visit note. 

## 2015-02-02 NOTE — Patient Instructions (Signed)
Get your blood work before you leave    

## 2015-02-02 NOTE — Assessment & Plan Note (Signed)
Refill Lotensin, check a BMP

## 2015-02-02 NOTE — Assessment & Plan Note (Signed)
Check A1c States he does not have time to exercise, not eating healthy. Importance of healthy lifestyle discussed, tips for a healthier diet provided. Also his cholesterol is  slightly elevated, he is not fasting today, we'll recheck on return to the office. Medication?

## 2015-02-02 NOTE — Progress Notes (Signed)
   Subjective:    Patient ID: Garrett Bell, male    DOB: 13-Mar-1976, 39 y.o.   MRN: [id number]  DOS:  02/02/2015 Type of visit - description : Routine visit Interval history:  Hypertension, good compliance of medication, no ambulatory BPs Diabetes, has not improved his diet  Review of Systems No chest pain or difficulty breathing No nausea or vomiting. No cough. No GERD symptoms  Past Medical History  Diagnosis Date  . HTN (hypertension)   . GERD (gastroesophageal reflux disease)   . Headache(784.0)   . Chest pain     neg stress test 01/2009  . Diabetes     A1C 6.4 (2012)    Past Surgical History  Procedure Laterality Date  . Vasectomy    . Hemorrhoid surgery  07-2008    thrombosed    History   Social History  . Marital Status: Divorced    Spouse Name: N/A  . Number of Children: 5  . Years of Education: N/A   Occupational History  . 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 Smoking status: Never Smoker   . Smokeless tobacco: Never Used  . Alcohol Use: No  . Drug Use: No  . Sexual Activity: Not on file   Other Topics Concern  . Not on file   Social History Narrative   divorced 2011, 5 children, household is pt, 3 of the children and fiancee                          Medication List       This list is accurate as of: 02/02/15  5:46 PM.  Always use your most recent med list.               benazepril 40 MG tablet  Commonly known as:  LOTENSIN  Take 1 tablet (40 mg total) by mouth daily.     co-enzyme Q-10 30 MG capsule  Take 30 mg by mouth 3 (three) times daily.     esomeprazole 40 MG capsule  Commonly known as:  NEXIUM  Take 40 mg by mouth daily.           Objective:   Physical Exam BP 126/74 mmHg  Pulse 74  Temp(Src) 97.9 F (36.6 C) (Oral)  Ht 5\' 10"  (1.778 m)  Wt 267 lb 6 oz (121.281 kg)  BMI 38.36 kg/m2  SpO2 98%  General:   Well developed, well nourished . NAD.  HEENT:  Normocephalic . Face symmetric,  atraumatic Lungs:  CTA B Normal respiratory effort, no intercostal retractions, no accessory muscle use. Heart: RRR,  no murmur.  No pretibial edema bilaterally  Skin: Not pale. Not jaundice Neurologic:  alert & oriented X3.  Speech normal, gait appropriate for age and unassisted Psych--  Cognition and judgment appear intact.  Cooperative with normal attention span and concentration.  Behavior appropriate. No anxious or depressed appearing.    Assessment & Plan:

## 2015-02-05 ENCOUNTER — Other Ambulatory Visit: Payer: Self-pay | Admitting: Internal Medicine

## 2015-06-15 ENCOUNTER — Encounter: Payer: Managed Care, Other (non HMO) | Admitting: Internal Medicine

## 2015-06-29 DIAGNOSIS — S46211A Strain of muscle, fascia and tendon of other parts of biceps, right arm, initial encounter: Secondary | ICD-10-CM

## 2015-06-29 HISTORY — DX: Strain of muscle, fascia and tendon of other parts of biceps, right arm, initial encounter: S46.211A

## 2015-07-04 HISTORY — PX: OTHER SURGICAL HISTORY: SHX169

## 2015-09-15 ENCOUNTER — Other Ambulatory Visit: Payer: Self-pay | Admitting: Internal Medicine

## 2015-10-31 ENCOUNTER — Encounter: Payer: Self-pay | Admitting: Internal Medicine

## 2015-10-31 ENCOUNTER — Ambulatory Visit (INDEPENDENT_AMBULATORY_CARE_PROVIDER_SITE_OTHER): Payer: Managed Care, Other (non HMO) | Admitting: Internal Medicine

## 2015-10-31 VITALS — BP 126/74 | HR 76 | Temp 97.9°F | Ht 70.0 in | Wt 271.4 lb

## 2015-10-31 DIAGNOSIS — R7303 Prediabetes: Secondary | ICD-10-CM

## 2015-10-31 DIAGNOSIS — I10 Essential (primary) hypertension: Secondary | ICD-10-CM | POA: Diagnosis not present

## 2015-10-31 DIAGNOSIS — Z09 Encounter for follow-up examination after completed treatment for conditions other than malignant neoplasm: Secondary | ICD-10-CM | POA: Insufficient documentation

## 2015-10-31 LAB — BASIC METABOLIC PANEL
BUN: 19 mg/dL (ref 6–23)
CO2: 30 mEq/L (ref 19–32)
Calcium: 9.7 mg/dL (ref 8.4–10.5)
Chloride: 104 mEq/L (ref 96–112)
Creatinine, Ser: 0.9 mg/dL (ref 0.40–1.50)
GFR: 98.33 mL/min (ref >60.00)
Glucose, Bld: 87 mg/dL (ref 70–99)
Potassium: 4 mEq/L (ref 3.5–5.1)
SODIUM: 139 meq/L (ref 135–145)

## 2015-10-31 LAB — HEMOGLOBIN A1C: HEMOGLOBIN A1C: 6.1 % (ref 4.6–6.5)

## 2015-10-31 MED ORDER — BENAZEPRIL HCL 40 MG PO TABS: 40.0000 mg | ORAL_TABLET | Freq: Every day | ORAL | Status: DC

## 2015-10-31 NOTE — Assessment & Plan Note (Signed)
Prediabetes: Encouraged healthy diet and increase physical activity, check A1c HTN: Continue Lotensin, check a BMP. BP today is very good RTC 6 months, CPX

## 2015-10-31 NOTE — Patient Instructions (Signed)
GO TO THE LAB :      Get the blood work     GO TO THE FRONT DESK Schedule your next appointment for a  physical When?   6 months Fasting?  Yes

## 2015-10-31 NOTE — Progress Notes (Signed)
Pre visit review using our clinic review tool, if applicable. No additional management support is needed unless otherwise documented below in the visit note. 

## 2015-10-31 NOTE — Progress Notes (Signed)
   Subjective:    Patient ID: Garrett FellersJacob E Bell, male    DOB: [birth year], 40 y.o.   MRN: [id number]  DOS:  10/31/2015 Type of visit - description : Routine visit Interval history: Good medication compliance Very seldom has any GERD symptoms No recent ambulatory BPs No change in lifestyle but thinking about going to the gym   Review of Systems  Denies chest pain or difficulty breathing No nausea, vomiting, diarrhea Past Medical History  Diagnosis Date  . HTN (hypertension)   . GERD (gastroesophageal reflux disease)   . Headache(784.0)   . Chest pain     neg stress test 01/2009  . Diabetes (HCC)     A1C 6.4 (2012)    Past Surgical History  Procedure Laterality Date  . Vasectomy    . Hemorrhoid surgery  07-2008    thrombosed  . Biceps surgery Right 07-04-15    tendon reattachment    Social History   Social History  . Marital Status: Divorced    Spouse Name: N/A  . Number of Children: 5  . Years of Education: N/A   Occupational History  . 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 Smoking status: Never Smoker   . Smokeless tobacco: Never Used  . Alcohol Use: No  . Drug Use: No  . Sexual Activity: Not on file   Other Topics Concern  . Not on file   Social History Narrative   divorced 2011, 5 children, household is pt, 3 of the children and wife (remarried)                          Medication List       This list is accurate as of: 10/31/15  8:46 PM.  Always use your most recent med list.               benazepril 40 MG tablet  Commonly known as:  LOTENSIN  Take 1 tablet (40 mg total) by mouth daily.     esomeprazole 40 MG capsule  Commonly known as:  NEXIUM  Take 40 mg by mouth daily.           Objective:   Physical Exam BP 126/74 mmHg  Pulse 76  Temp(Src) 97.9 F (36.6 C) (Oral)  Ht 5\' 10"  (1.778 m)  Wt 271 lb 6 oz (123.095 kg)  BMI 38.94 kg/m2  SpO2 97% General:   Well developed, well nourished . NAD.  HEENT:    Normocephalic . Face symmetric, atraumatic Lungs:  CTA B Normal respiratory effort, no intercostal retractions, no accessory muscle use. Heart: RRR,  no murmur.  No pretibial edema bilaterally  Skin: Not pale. Not jaundice Neurologic:  alert & oriented X3.  Speech normal, gait appropriate for age and unassisted Psych--  Cognition and judgment appear intact.  Cooperative with normal attention span and concentration.  Behavior appropriate. No anxious or depressed appearing.      Assessment & Plan:   Assessment Prediabetes dx- 2012, A1c 6.4 HTN GERD H/o headaches H/o  stress test 2010 (-)  PLAN Prediabetes: Encouraged healthy diet and increase physical activity, check A1c HTN: Continue Lotensin, check a BMP. BP today is very good RTC 6 months, CPX

## 2016-05-01 ENCOUNTER — Ambulatory Visit (INDEPENDENT_AMBULATORY_CARE_PROVIDER_SITE_OTHER): Payer: Managed Care, Other (non HMO) | Admitting: Internal Medicine

## 2016-05-01 ENCOUNTER — Encounter: Payer: Self-pay | Admitting: Internal Medicine

## 2016-05-01 VITALS — BP 126/76 | HR 65 | Temp 98.0°F | Resp 14 | Ht 70.0 in | Wt 267.4 lb

## 2016-05-01 DIAGNOSIS — Z114 Encounter for screening for human immunodeficiency virus [HIV]: Secondary | ICD-10-CM

## 2016-05-01 DIAGNOSIS — R7303 Prediabetes: Secondary | ICD-10-CM

## 2016-05-01 DIAGNOSIS — Z0000 Encounter for general adult medical examination without abnormal findings: Secondary | ICD-10-CM

## 2016-05-01 LAB — HIV ANTIBODY (ROUTINE TESTING W REFLEX): HIV: NONREACTIVE

## 2016-05-01 LAB — AST: AST: 20 U/L (ref 0–37)

## 2016-05-01 LAB — BASIC METABOLIC PANEL
BUN: 17 mg/dL (ref 6–23)
CO2: 30 mEq/L (ref 19–32)
Calcium: 8.9 mg/dL (ref 8.4–10.5)
Chloride: 101 mEq/L (ref 96–112)
Creatinine, Ser: 0.95 mg/dL (ref 0.40–1.50)
GFR: 92.16 mL/min (ref >60.00)
Glucose, Bld: 102 mg/dL — ABNORMAL HIGH (ref 70–99)
POTASSIUM: 3.8 meq/L (ref 3.5–5.1)
SODIUM: 139 meq/L (ref 135–145)

## 2016-05-01 LAB — TSH: TSH: 4.17 u[IU]/mL (ref 0.35–4.50)

## 2016-05-01 LAB — LIPID PANEL
CHOL/HDL RATIO: 6
CHOLESTEROL: 221 mg/dL — AB (ref 0–200)
HDL: 39.2 mg/dL (ref >39.00)
LDL Cholesterol: 159 mg/dL — ABNORMAL HIGH (ref 0–99)
NonHDL: 181.57
TRIGLYCERIDES: 111 mg/dL (ref 0.0–149.0)
VLDL: 22.2 mg/dL (ref 0.0–40.0)

## 2016-05-01 LAB — ALT: ALT: 24 U/L (ref 0–53)

## 2016-05-01 LAB — HEMOGLOBIN A1C: Hgb A1c MFr Bld: 6 % (ref 4.6–6.5)

## 2016-05-01 NOTE — Assessment & Plan Note (Signed)
Td 2008;  flu shot ---declined , benefits discussed  Patient is active at work but no routine exercise, diet is regular. Counseled. labs : HIV, BMP, AST, ALT, FLP, A1c, TSH

## 2016-05-01 NOTE — Patient Instructions (Signed)
GO TO THE LAB : Get the blood work     GO TO THE FRONT DESK Schedule your next appointment for a  routine checkup in 6 months     Food Choices for Gastroesophageal Reflux Disease, Adult When you have gastroesophageal reflux disease (GERD), the foods you eat and your eating habits are very important. Choosing the right foods can help ease the discomfort of GERD. WHAT GENERAL GUIDELINES DO I NEED TO FOLLOW?  Choose fruits, vegetables, whole grains, low-fat dairy products, and low-fat meat, fish, and poultry.  Limit fats such as oils, salad dressings, butter, nuts, and avocado.  Keep a food diary to identify foods that cause symptoms.  Avoid foods that cause reflux. These may be different for different people.  Eat frequent small meals instead of three large meals each day.  Eat your meals slowly, in a relaxed setting.  Limit fried foods.  Cook foods using methods other than frying.  Avoid drinking alcohol.  Avoid drinking large amounts of liquids with your meals.  Avoid bending over or lying down until 2-3 hours after eating. WHAT FOODS ARE NOT RECOMMENDED? The following are some foods and drinks that may worsen your symptoms: Vegetables Tomatoes. Tomato juice. Tomato and spaghetti sauce. Chili peppers. Onion and garlic. Horseradish. Fruits Oranges, grapefruit, and lemon (fruit and juice). Meats High-fat meats, fish, and poultry. This includes hot dogs, ribs, ham, sausage, salami, and bacon. Dairy Whole milk and chocolate milk. Sour cream. Cream. Butter. Ice cream. Cream cheese.  Beverages Coffee and tea, with or without caffeine. Carbonated beverages or energy drinks. Condiments Hot sauce. Barbecue sauce.  Sweets/Desserts Chocolate and cocoa. Donuts. Peppermint and spearmint. Fats and Oils High-fat foods, including [place] fries and potato chips. Other Vinegar. Strong spices, such as black pepper, white pepper, red pepper, cayenne, curry powder, cloves, ginger, and  chili powder. The items listed above may not be a complete list of foods and beverages to avoid. Contact your dietitian for more information.   This information is not intended to replace advice given to you by your health care provider. Make sure you discuss any questions you have with your health care provider.   Document Released: 07/15/2005 Document Revised: 08/05/2014 Document Reviewed: 05/19/2013 Elsevier Interactive Patient Education [employer]2016 Elsevier Inc.

## 2016-05-01 NOTE — Progress Notes (Signed)
Subjective:    Patient ID: Garrett Bell, male    DOB: 1976/02/25, 40 y.o.   MRN: [id number]  DOS:  05/01/2016 Type of visit - description : cpx Interval history:No major concerns   Wt Readings from Last 3 Encounters:  05/01/16 267 lb 6 oz (121.3 kg)  10/31/15 271 lb 6 oz (123.1 kg)  02/02/15 267 lb 6 oz (121.3 kg)    Review of Systems Constitutional: No fever. No chills. No unexplained wt changes. No unusual sweats  HEENT: No dental problems, no ear discharge, no facial swelling, no voice changes. No eye discharge, no eye  redness , no  intolerance to light   Respiratory: No wheezing , no  difficulty breathing. No cough , no mucus production  Cardiovascular: No CP, no leg swelling , no  Palpitations  GI: no nausea, no vomiting, no diarrhea , no  abdominal pain.  No blood in the stools. No dysphagia, no odynophagia    Endocrine: No polyphagia, no polyuria , no polydipsia  GU: No dysuria, gross hematuria, difficulty urinating. No urinary urgency, no frequency.  Musculoskeletal: No joint swellings or unusual aches or pains  Skin: No change in the color of the skin, palor , no  Rash  Allergic, immunologic: No environmental allergies , no  food allergies  Neurological: No dizziness no  syncope. No headaches. No diplopia, no slurred, no slurred speech, no motor deficits, no facial  Numbness  Hematological: No enlarged lymph nodes, no easy bruising , no unusual bleedings  Psychiatry: No suicidal ideas, no hallucinations, no beavior problems, no confusion.  No unusual/severe anxiety, no depression   Past Medical History:  Diagnosis Date  . Chest pain    neg stress test 01/2009  . Diabetes (HCC)    A1C 6.4 (2012)  . GERD (gastroesophageal reflux disease)   . Headache(784.0)   . HTN (hypertension)   . Traumatic partial tear of right biceps tendon 06/2015    Past Surgical History:  Procedure Laterality Date  . biceps surgery Right 07-04-15   tendon reattachment  .  HEMORRHOID SURGERY  07-2008   thrombosed  . VASECTOMY      Social History   Social History  . Marital status: Divorced    Spouse name: N/A  . Number of children: 5  . Years of education: N/A   Occupational History  . manager Designer, [employer]     Social History Main Topics  . Smoking status: Never Smoker  . Smokeless tobacco: Never Used  . Alcohol use No  . Drug use: No  . Sexual activity: Not on file   Other Topics Concern  . Not on file   Social History Narrative   divorced 2011, 5 children, household is pt, 3 of the children and wife (remarried)        Medication List       Accurate as of 05/01/16  5:59 PM. Always use your most recent med list.          benazepril 40 MG tablet Commonly known as:  LOTENSIN Take 1 tablet (40 mg total) by mouth daily.   esomeprazole 40 MG capsule Commonly known as:  NEXIUM Take 40 mg by mouth daily.          Objective:   Physical Exam BP 126/76 (BP Location: Left Arm, Patient Position: Sitting, Cuff Size: Normal)   Pulse 65   Temp 98 F (36.7 C) (Oral)   Resp 14   Ht 5\' 10"  (1.778 m)  Wt 267 lb 6 oz (121.3 kg)   SpO2 97%   BMI 38.36 kg/m   General:   Well developed, well nourished . NAD.  Neck: No  thyromegaly  HEENT:  Normocephalic . Face symmetric, atraumatic Lungs:  CTA B Normal respiratory effort, no intercostal retractions, no accessory muscle use. Heart: RRR,  no murmur.  No pretibial edema bilaterally  Abdomen:  Not distended, soft, non-tender. No rebound or rigidity.   Skin: Exposed areas without rash. Not pale. Not jaundice Neurologic:  alert & oriented X3.  Speech normal, gait appropriate for age and unassisted Strength symmetric and appropriate for age.  Psych: Cognition and judgment appear intact.  Cooperative with normal attention span and concentration.  Behavior appropriate. No anxious or depressed appearing.    Assessment & Plan:   Assessment Prediabetes dx- 2012, A1c  6.4 HTN GERD H/o headaches H/o  stress test 2010 (-)  PLAN Prediabetes: Check A1c, extensive discussion about diet, exercise, BMI and the meaning of A1c HTN: On benazepril, controlled GERD: states that w/o nexium he would have symptoms, recommend to try to avoid triggers (see AVS)  and see if he could drop PPIs to every other day RTC 6 months

## 2016-05-01 NOTE — Assessment & Plan Note (Signed)
Prediabetes: Check A1c, extensive discussion about diet, exercise, BMI and the meaning of A1c HTN: On benazepril, controlled GERD: states that w/o nexium he would have symptoms, recommend to try to avoid triggers (see AVS)  and see if he could drop PPIs to every other day RTC 6 months

## 2016-05-01 NOTE — Progress Notes (Signed)
Pre visit review using our clinic review tool, if applicable. No additional management support is needed unless otherwise documented below in the visit note. 

## 2016-08-13 ENCOUNTER — Other Ambulatory Visit: Payer: Self-pay | Admitting: Internal Medicine

## 2016-11-01 ENCOUNTER — Ambulatory Visit (INDEPENDENT_AMBULATORY_CARE_PROVIDER_SITE_OTHER): Payer: Managed Care, Other (non HMO) | Admitting: Internal Medicine

## 2016-11-01 ENCOUNTER — Encounter: Payer: Self-pay | Admitting: Internal Medicine

## 2016-11-01 VITALS — BP 122/84 | HR 84 | Temp 98.2°F | Resp 14 | Ht 70.0 in | Wt 268.0 lb

## 2016-11-01 DIAGNOSIS — I10 Essential (primary) hypertension: Secondary | ICD-10-CM

## 2016-11-01 DIAGNOSIS — K219 Gastro-esophageal reflux disease without esophagitis: Secondary | ICD-10-CM

## 2016-11-01 DIAGNOSIS — R7303 Prediabetes: Secondary | ICD-10-CM | POA: Diagnosis not present

## 2016-11-01 LAB — CBC WITH DIFFERENTIAL/PLATELET
Basophils Absolute: 0.1 10*3/uL (ref 0.0–0.1)
Basophils Relative: 0.7 % (ref 0.0–3.0)
EOS PCT: 1.7 % (ref 0.0–5.0)
Eosinophils Absolute: 0.2 10*3/uL (ref 0.0–0.7)
HCT: 46.4 % (ref 39.0–52.0)
HEMOGLOBIN: 15.6 g/dL (ref 13.0–17.0)
LYMPHS PCT: 20.7 % (ref 12.0–46.0)
Lymphs Abs: 2.1 10*3/uL (ref 0.7–4.0)
MCHC: 33.7 g/dL (ref 30.0–36.0)
MCV: 91 fl (ref 78.0–100.0)
MONO ABS: 1 10*3/uL (ref 0.1–1.0)
Monocytes Relative: 9.7 % (ref 3.0–12.0)
Neutro Abs: 6.7 10*3/uL (ref 1.4–7.7)
Neutrophils Relative %: 67.2 % (ref 43.0–77.0)
Platelets: 153 10*3/uL (ref 150.0–400.0)
RBC: 5.1 Mil/uL (ref 4.22–5.81)
RDW: 13.2 % (ref 11.5–15.5)
WBC: 10 10*3/uL (ref 4.0–10.5)

## 2016-11-01 LAB — BASIC METABOLIC PANEL
BUN: 18 mg/dL (ref 6–23)
CHLORIDE: 103 meq/L (ref 96–112)
CO2: 30 meq/L (ref 19–32)
CREATININE: 0.92 mg/dL (ref 0.40–1.50)
Calcium: 9.2 mg/dL (ref 8.4–10.5)
GFR: 95.41 mL/min (ref >60.00)
Glucose, Bld: 87 mg/dL (ref 70–99)
POTASSIUM: 3.8 meq/L (ref 3.5–5.1)
Sodium: 138 mEq/L (ref 135–145)

## 2016-11-01 MED ORDER — SILDENAFIL CITRATE 20 MG PO TABS: 60.0000 mg | ORAL_TABLET | Freq: Every evening | ORAL | 1 refills | Status: DC | PRN

## 2016-11-01 NOTE — Progress Notes (Signed)
Pre visit review using our clinic review tool, if applicable. No additional management support is needed unless otherwise documented below in the visit note. 

## 2016-11-01 NOTE — Progress Notes (Signed)
Subjective:    Patient ID: Garrett Bell, male    DOB: [birth year], 41 y.o.   MRN: [id number]  DOS:  11/01/2016 Type of visit - description : Routine checkup Interval history: Hyperglycemia: diet  about the same, started to exercise a little more 2 weeks ago. HTN: Good medication compliance, ambulatory BPs when checked normal GERD: Symptoms are controlled as long as he takes his medication. History of ED, Viagra? Was prescribed before but never used it.  Wt Readings from Last 3 Encounters:  11/01/16 268 lb (121.6 kg)  05/01/16 267 lb 6 oz (121.3 kg)  10/31/15 271 lb 6 oz (123.1 kg)     Review of Systems Denies chest pain, difficulty breathing, claudication. Emotionally doing well with no anxiety or depression  Past Medical History:  Diagnosis Date  . Chest pain    neg stress test 01/2009  . Diabetes (HCC)    A1C 6.4 (2012)  . GERD (gastroesophageal reflux disease)   . Headache(784.0)   . HTN (hypertension)   . Traumatic partial tear of right biceps tendon 06/2015    Past Surgical History:  Procedure Laterality Date  . biceps surgery Right 07-04-15   tendon reattachment  . HEMORRHOID SURGERY  07-2008   thrombosed  . VASECTOMY      Social History   Social History  . Marital status: Divorced    Spouse name: N/A  . Number of children: 5  . Years of education: N/A   Occupational History  . manager Designer, [employer]     Social History Main Topics  . Smoking status: Never Smoker  . Smokeless tobacco: Never Used  . Alcohol use No  . Drug use: No  . Sexual activity: Not on file   Other Topics Concern  . Not on file   Social History Narrative   divorced 2011, 5 children, household is pt, 3 of the children and wife (remarried)      Allergies as of 11/01/2016      Reactions   Hydrochlorothiazide    Sulfonamide Derivatives       Medication List       Accurate as of 11/01/16 11:59 PM. Always use your most recent med list.          benazepril 40 MG  tablet Commonly known as:  LOTENSIN Take 1 tablet (40 mg total) by mouth daily.   esomeprazole 40 MG capsule Commonly known as:  NEXIUM Take 40 mg by mouth daily.   sildenafil 20 MG tablet Commonly known as:  REVATIO Take 3-4 tablets (60-80 mg total) by mouth at bedtime as needed.          Objective:   Physical Exam BP 122/84 (BP Location: Left Arm, Patient Position: Sitting, Cuff Size: Normal)   Pulse 84   Temp 98.2 F (36.8 C) (Oral)   Resp 14   Ht  (1.778 m)   Wt 268 lb (121.6 kg)   SpO2 97%   BMI 38.45 kg/m  General:   Well developed, well nourished . NAD.  HEENT:  Normocephalic . Face symmetric, atraumatic Lungs:  CTA B Normal respiratory effort, no intercostal retractions, no accessory muscle use. Heart: RRR,  no murmur.  No pretibial edema bilaterally  Skin: Not pale. Not jaundice Neurologic:  alert & oriented X3.  Speech normal, gait appropriate for age and unassisted Psych--  Cognition and judgment appear intact.  Cooperative with normal attention span and concentration.  Behavior appropriate. No anxious or depressed appearing.  Assessment & Plan:     Assessment Prediabetes dx- 2012, A1c 6.4 HTN GERD ED H/o headaches H/o  stress test 2010 (-)  PLAN Diabetes: Has not changed much his lifestyle, plans to increase his physical activity. HTN: On benazepril, controlled, check a BMP and CBC GERD: Controlled as long as he takes medications.  ED: Requests Viagra, I see no contraindication, side effects and how to use sildenafil discussed. RTC 6 months, CPX

## 2016-11-01 NOTE — Patient Instructions (Signed)
GO TO THE LAB : Get the blood work     GO TO THE FRONT DESK Schedule your next appointment for a  physical exam in 6 months, fasting   Sildenafil 20 mg: 3- 4 tablets once daily, 1 hour before

## 2016-11-02 NOTE — Assessment & Plan Note (Signed)
Diabetes: Has not changed much his lifestyle, plans to increase his physical activity. HTN: On benazepril, controlled, check a BMP and CBC GERD: Controlled as long as he takes medications.  ED: Requests Viagra, I see no contraindication, side effects and how to use sildenafil discussed. RTC 6 months, CPX

## 2016-12-29 ENCOUNTER — Encounter (HOSPITAL_COMMUNITY): Payer: Self-pay | Admitting: Emergency Medicine

## 2016-12-29 ENCOUNTER — Emergency Department (HOSPITAL_COMMUNITY)
Admission: EM | Admit: 2016-12-29 | Discharge: 2016-12-30 | Disposition: A | Discharge status: Home / Self Care | Payer: Managed Care, Other (non HMO) | Attending: Emergency Medicine | Admitting: Emergency Medicine

## 2016-12-29 DIAGNOSIS — I10 Essential (primary) hypertension: Secondary | ICD-10-CM | POA: Insufficient documentation

## 2016-12-29 DIAGNOSIS — E109 Type 1 diabetes mellitus without complications: Secondary | ICD-10-CM | POA: Insufficient documentation

## 2016-12-29 DIAGNOSIS — R1013 Epigastric pain: Secondary | ICD-10-CM | POA: Insufficient documentation

## 2016-12-29 LAB — COMPREHENSIVE METABOLIC PANEL
ALT: 27 U/L (ref 17–63)
ANION GAP: 7 (ref 5–15)
AST: 27 U/L (ref 15–41)
Albumin: 4 g/dL (ref 3.5–5.0)
Alkaline Phosphatase: 55 U/L (ref 38–126)
BILIRUBIN TOTAL: 0.9 mg/dL (ref 0.3–1.2)
BUN: 16 mg/dL (ref 6–20)
CHLORIDE: 102 mmol/L (ref 101–111)
CO2: 26 mmol/L (ref 22–32)
Calcium: 8.7 mg/dL — ABNORMAL LOW (ref 8.9–10.3)
Creatinine, Ser: 1.04 mg/dL (ref 0.61–1.24)
Glucose, Bld: 105 mg/dL — ABNORMAL HIGH (ref 65–99)
POTASSIUM: 3.5 mmol/L (ref 3.5–5.1)
Sodium: 135 mmol/L (ref 135–145)
TOTAL PROTEIN: 6.9 g/dL (ref 6.5–8.1)

## 2016-12-29 LAB — URINALYSIS, ROUTINE W REFLEX MICROSCOPIC
Bilirubin Urine: NEGATIVE
GLUCOSE, UA: NEGATIVE mg/dL
Hgb urine dipstick: NEGATIVE
KETONES UR: 5 mg/dL — AB
LEUKOCYTES UA: NEGATIVE
NITRITE: NEGATIVE
PH: 5 (ref 5.0–8.0)
Protein, ur: NEGATIVE mg/dL
Specific Gravity, Urine: 1.024 (ref 1.005–1.030)

## 2016-12-29 LAB — CBC
HEMATOCRIT: 44.4 % (ref 39.0–52.0)
HEMOGLOBIN: 15.3 g/dL (ref 13.0–17.0)
MCH: 30.5 pg (ref 26.0–34.0)
MCHC: 34.5 g/dL (ref 30.0–36.0)
MCV: 88.4 fL (ref 78.0–100.0)
Platelets: 131 10*3/uL — ABNORMAL LOW (ref 150–400)
RBC: 5.02 MIL/uL (ref 4.22–5.81)
RDW: 13.2 % (ref 11.5–15.5)
WBC: 5.7 10*3/uL (ref 4.0–10.5)

## 2016-12-29 LAB — TYPE AND SCREEN
ABO/RH(D): A POS
ANTIBODY SCREEN: NEGATIVE

## 2016-12-29 LAB — LIPASE, BLOOD: LIPASE: 26 U/L (ref 11–51)

## 2016-12-29 NOTE — ED Triage Notes (Signed)
Reports having nausea vomiting with some abdominal pain.  Worse pain today.  Reports epigastric radiating to right upper quad.  Nausea relieved at this time.  Per spouse vomitus appeared coffee ground in nature.

## 2016-12-30 ENCOUNTER — Encounter (HOSPITAL_COMMUNITY): Payer: Self-pay | Admitting: Emergency Medicine

## 2016-12-30 LAB — ABO/RH: ABO/RH(D): A POS

## 2016-12-30 LAB — POC OCCULT BLOOD, ED: Fecal Occult Bld: POSITIVE — AB

## 2016-12-30 MED ORDER — SODIUM CHLORIDE 0.9 % IV BOLUS (SEPSIS)
1000.0000 mL | Freq: Once | INTRAVENOUS | Status: AC
  Administered 2016-12-30: 1000 mL via INTRAVENOUS

## 2016-12-30 MED ORDER — RANITIDINE HCL 150 MG PO CAPS: 150.0000 mg | ORAL_CAPSULE | Freq: Every day | ORAL | 0 refills | Status: DC

## 2016-12-30 MED ORDER — ESOMEPRAZOLE MAGNESIUM 40 MG PO CPDR: 40.0000 mg | DELAYED_RELEASE_CAPSULE | Freq: Every day | ORAL | 0 refills | Status: DC

## 2016-12-30 MED ORDER — SUCRALFATE 1 G PO TABS
1.0000 g | ORAL_TABLET | Freq: Once | ORAL | Status: AC
  Administered 2016-12-30: 1 g via ORAL
  Filled 2016-12-30: qty 1

## 2016-12-30 MED ORDER — GI COCKTAIL ~~LOC~~
30.0000 mL | Freq: Once | ORAL | Status: AC
  Administered 2016-12-30: 30 mL via ORAL
  Filled 2016-12-30: qty 30

## 2016-12-30 NOTE — ED Provider Notes (Signed)
[facility]-EMERGENCY DEPT Provider Note   CSN: [id number] Arrival date & time: 12/29/16  2211     History   Chief Complaint Chief Complaint  Patient presents with  . Abdominal Pain    HPI Garrett Bell is a 41 y.o. male.  HPI   40 year old male with hx of GERD, HTN, DM presenting with c/o abd pain.  Since yesterday pt developed epigastric abd pain.  Pain is sharp, radiates to back, wax and wane.  He did felt nauseous and reported vomited dark emesis which he believes it was blood product.  sts it was a moderate amount but unable to quantify.  Since then he has been feeling feverish, with 5/10 epigastric pain.  He denies fever, headache, lightheadedness, dizziness, cp, sob, productive cough, dysuria, hematuria, hematochezia or melena.  Did report taking ibuprofen yesterday morning but denies regular NSAIDs use.  Denies alcohol use, or drug use.  Has hx of GERD which he takes prilosec and sts that this felt different.    Past Medical History:  Diagnosis Date  . Chest pain    neg stress test 01/2009  . Diabetes (HCC)    A1C 6.4 (2012)  . GERD (gastroesophageal reflux disease)   . Headache(784.0)   . HTN (hypertension)   . Traumatic partial tear of right biceps tendon 06/2015    Patient Active Problem List   Diagnosis Date Noted  . PCP NOTES >>>>>>>>>>>>>>>>>>>> 10/31/2015  . Skin lesion 12/25/2013  . CTS (carpal tunnel syndrome) 08/11/2012  . Annual physical exam 08/20/2011  . Borderline diabetes 08/20/2011  . HEADACHE 06/16/2007  . Essential hypertension 04/07/2007  . GERD 04/07/2007    Past Surgical History:  Procedure Laterality Date  . biceps surgery Right 07-04-15   tendon reattachment  . HEMORRHOID SURGERY  07-2008   thrombosed  . VASECTOMY         Home Medications    Prior to Admission medications   Medication Sig Start Date End Date Taking? Authorizing Provider  benazepril (LOTENSIN) 40 MG tablet Take 1 tablet (40 mg total) by mouth daily. 08/13/16   Wanda Plump, MD  esomeprazole (NEXIUM) 40 MG capsule Take 40 mg by mouth daily.      [provider]  sildenafil (REVATIO) 20 MG tablet Take 3-4 tablets (60-80 mg total) by mouth at bedtime as needed. 11/01/16   Wanda Plump, MD    Family History Family History  Problem Relation Age of Onset  . Hypertension Father   . Stroke Other        GF  . Aneurysm Other        GM, brain  . CAD Maternal Grandmother   . Colon cancer Neg Hx   . Prostate cancer Neg Hx     Social History Social History  Substance Use Topics  . Smoking status: Never Smoker  . Smokeless tobacco: Never Used  . Alcohol use No     Allergies   Hydrochlorothiazide and Sulfonamide derivatives   Review of Systems Review of Systems  All other systems reviewed and are negative.    Physical Exam Updated Vital Signs BP (!) 162/115 (BP Location: Right Arm)   Pulse (!) 106   Temp 99.5 F (37.5 C) (Oral)   Resp 18   Ht 5\' 10"  (1.778 m)   Wt 120.2 kg (265 lb)   SpO2 96%   BMI 38.02 kg/m   Physical Exam  Constitutional: He appears well-developed and well-nourished. No distress.  HENT:  Head: Atraumatic.  Eyes: Conjunctivae are normal.  Neck: Neck supple.  Cardiovascular: Normal rate and regular rhythm.   Pulmonary/Chest: Effort normal and breath sounds normal. No respiratory distress. He exhibits no tenderness.  Abdominal: Soft. Bowel sounds are normal. He exhibits no distension. There is tenderness (mild epigastric tenderness, no guarding or rebound tenderness).  Negative Murphy sign, no pain at McBurney's point  Genitourinary:  Genitourinary Comments: No CVA tenderness Chaperone present during exam.  Normal color stool, normal rectal tone, no rectal mass, and no fecal impaction.   Neurological: He is alert.  Skin: No rash noted.  Psychiatric: He has a normal mood and affect.  Nursing note and vitals reviewed.    ED Treatments / Results  Labs (all labs ordered are listed, but only abnormal  results are displayed) Labs Reviewed  COMPREHENSIVE METABOLIC PANEL - Abnormal; Notable for the following:       Result Value   Glucose, Bld 105 (*)    Calcium 8.7 (*)    All other components within normal limits  CBC - Abnormal; Notable for the following:    Platelets 131 (*)    All other components within normal limits  URINALYSIS, ROUTINE W REFLEX MICROSCOPIC - Abnormal; Notable for the following:    Ketones, ur 5 (*)    All other components within normal limits  POC OCCULT BLOOD, ED - Abnormal; Notable for the following:    Fecal Occult Bld POSITIVE (*)    All other components within normal limits  LIPASE, BLOOD  POC OCCULT BLOOD, ED  TYPE AND SCREEN  ABO/RH    EKG  EKG Interpretation None       Radiology No results found.  Procedures Procedures (including critical care time)  Medications Ordered in ED Medications - No data to display   Initial Impression / Assessment and Plan / ED Course  I have reviewed the triage vital signs and the nursing notes.  Pertinent labs & imaging results that were available during my care of the patient were reviewed by me and considered in my medical decision making (see chart for details).     BP (!) 140/93   Pulse 83   Temp 99.5 F (37.5 C) (Oral)   Resp 18   Ht 5\' 10"  (1.778 m)   Wt 120.2 kg (265 lb)   SpO2 97%   BMI 38.02 kg/m    Final Clinical Impressions(s) / ED Diagnoses   Final diagnoses:  Epigastric pain    New Prescriptions New Prescriptions   RANITIDINE (ZANTAC) 150 MG CAPSULE    Take 1 capsule (150 mg total) by mouth daily.   Pt here with epigastric pain suggestive of gastritis.  Report hematemesis; one episode and none since.  He has a fairly benign abdominal exam.  Will check hemoccult.  Did report one episode of NSAIDs use, doubt that the causative factor.  Doubt biliary etiology.  Doubt pancretitis.  Will give GI cocktail and Carafate.  Work up initiated.    2:27 AM Patient reported improvement  of symptoms after receiving GI cocktail and Carafate. Fecal occult blood test is positive however he is not having gross rectal bleeding. Hemoglobin is normal at 15.3. His labs are reassuring, platelet is mildly depressed at 131. At this time, I encourage patient to take his PPI, and H2 blocker 30 minutes before each meal for treatment of suspected gastritis. Encouraged patient to follow-up outpatient with PCP for further care. Strict return precaution given. Patient is stable for discharge.  Fayrene Helperran, Corda Shutt, PA-C 12/30/16 [id number]    Azalia Bilisampos, Kevin, MD 12/30/16 [phone]

## 2016-12-30 NOTE — ED Notes (Signed)
Pt c/o blood in vomit.

## 2016-12-30 NOTE — ED Notes (Signed)
[facility]

## 2016-12-30 NOTE — Discharge Instructions (Signed)
Please take Nexium, and Zantac 30 minutes before each meal as it is most effective. Follow-up with your primary care provider for further evaluation. If his symptoms worsen or if you have any concern, please return for further care.

## 2016-12-31 ENCOUNTER — Telehealth: Payer: Self-pay

## 2016-12-31 NOTE — Telephone Encounter (Signed)
Pt seen in ED 12/29/2016 for abdominal pain, needs ED follow-up, please schedule at Pt's convenience within next several days. Thank you.

## 2016-12-31 NOTE — Telephone Encounter (Signed)
Called pt to schedule app this week ED fu. Offered some dates/times. Pt wants Friday after child's kindergarten graduation. Pt states will grab his schedule and call us back.

## 2017-01-01 ENCOUNTER — Telehealth: Payer: Self-pay | Admitting: Internal Medicine

## 2017-01-01 NOTE — Telephone Encounter (Signed)
Called pt left msg to call back and schedule hospital f/u appt. cb

## 2017-02-04 ENCOUNTER — Other Ambulatory Visit: Payer: Self-pay | Admitting: Internal Medicine

## 2017-05-05 ENCOUNTER — Other Ambulatory Visit: Payer: Self-pay | Admitting: Internal Medicine

## 2017-05-06 ENCOUNTER — Encounter: Payer: Managed Care, Other (non HMO) | Admitting: Internal Medicine

## 2017-05-31 ENCOUNTER — Other Ambulatory Visit: Payer: Self-pay | Admitting: Internal Medicine

## 2017-06-11 HISTORY — PX: CARPAL TUNNEL RELEASE: SHX101

## 2017-06-18 ENCOUNTER — Encounter: Payer: Managed Care, Other (non HMO) | Admitting: Internal Medicine

## 2017-06-30 ENCOUNTER — Other Ambulatory Visit: Payer: Self-pay | Admitting: Internal Medicine

## 2017-08-11 ENCOUNTER — Ambulatory Visit (INDEPENDENT_AMBULATORY_CARE_PROVIDER_SITE_OTHER): Payer: Managed Care, Other (non HMO) | Admitting: Internal Medicine

## 2017-08-11 ENCOUNTER — Encounter: Payer: Self-pay | Admitting: Internal Medicine

## 2017-08-11 VITALS — BP 132/72 | HR 67 | Temp 97.9°F | Resp 14 | Ht 70.0 in | Wt 272.0 lb

## 2017-08-11 DIAGNOSIS — Z23 Encounter for immunization: Secondary | ICD-10-CM | POA: Diagnosis not present

## 2017-08-11 DIAGNOSIS — Z0000 Encounter for general adult medical examination without abnormal findings: Secondary | ICD-10-CM | POA: Diagnosis not present

## 2017-08-11 LAB — LIPID PANEL
CHOL/HDL RATIO: 5
Cholesterol: 199 mg/dL (ref 0–200)
HDL: 38.6 mg/dL — ABNORMAL LOW (ref >39.00)
LDL CALC: 145 mg/dL — AB (ref 0–99)
NonHDL: 160.69
Triglycerides: 79 mg/dL (ref 0.0–149.0)
VLDL: 15.8 mg/dL (ref 0.0–40.0)

## 2017-08-11 LAB — CBC WITH DIFFERENTIAL/PLATELET
BASOS PCT: 0.9 % (ref 0.0–3.0)
Basophils Absolute: 0 10*3/uL (ref 0.0–0.1)
EOS PCT: 2.4 % (ref 0.0–5.0)
Eosinophils Absolute: 0.1 10*3/uL (ref 0.0–0.7)
HEMATOCRIT: 46.9 % (ref 39.0–52.0)
HEMOGLOBIN: 15.8 g/dL (ref 13.0–17.0)
LYMPHS PCT: 36.1 % (ref 12.0–46.0)
Lymphs Abs: 1.9 10*3/uL (ref 0.7–4.0)
MCHC: 33.6 g/dL (ref 30.0–36.0)
MCV: 91.8 fl (ref 78.0–100.0)
MONOS PCT: 11.2 % (ref 3.0–12.0)
Monocytes Absolute: 0.6 10*3/uL (ref 0.1–1.0)
NEUTROS ABS: 2.6 10*3/uL (ref 1.4–7.7)
Neutrophils Relative %: 49.4 % (ref 43.0–77.0)
Platelets: 166 10*3/uL (ref 150.0–400.0)
RBC: 5.11 Mil/uL (ref 4.22–5.81)
RDW: 13.4 % (ref 11.5–15.5)
WBC: 5.3 10*3/uL (ref 4.0–10.5)

## 2017-08-11 LAB — BASIC METABOLIC PANEL
BUN: 20 mg/dL (ref 6–23)
CHLORIDE: 103 meq/L (ref 96–112)
CO2: 30 meq/L (ref 19–32)
Calcium: 9.3 mg/dL (ref 8.4–10.5)
Creatinine, Ser: 0.99 mg/dL (ref 0.40–1.50)
GFR: 87.35 mL/min (ref >60.00)
Glucose, Bld: 91 mg/dL (ref 70–99)
POTASSIUM: 4.2 meq/L (ref 3.5–5.1)
SODIUM: 140 meq/L (ref 135–145)

## 2017-08-11 LAB — TSH: TSH: 2.93 u[IU]/mL (ref 0.35–4.50)

## 2017-08-11 LAB — HEMOGLOBIN A1C: HEMOGLOBIN A1C: 6 % (ref 4.6–6.5)

## 2017-08-11 NOTE — Progress Notes (Signed)
Subjective:    Patient ID: Garrett Bell, male    DOB: 1975/09/16, 42 y.o.   MRN: [id number]  DOS:  08/11/2017 Type of visit - description : cpx Interval history: Status post bilateral carpal tunnel syndrome surgery, recovering well, stopped working for few weeks and was not as active.  Has gained some weight. Diet : unchanged, regular.   Wt Readings from Last 3 Encounters:  08/11/17 272 lb (123.4 kg)  12/29/16 265 lb (120.2 kg)  11/01/16 268 lb (121.6 kg)    Review of Systems   Other than above, a 14 point review of systems is negative     Past Medical History:  Diagnosis Date  . Chest pain    neg stress test 01/2009  . Diabetes (HCC)    A1C 6.4 (2012)  . GERD (gastroesophageal reflux disease)   . Headache(784.0)   . HTN (hypertension)   . Traumatic partial tear of right biceps tendon 06/2015    Past Surgical History:  Procedure Laterality Date  . biceps surgery Right 07-04-15   tendon reattachment  . CARPAL TUNNEL RELEASE Bilateral 06/11/2017  . HEMORRHOID SURGERY  07-2008   thrombosed  . VASECTOMY      Social History   Socioeconomic History  . Marital status: Divorced    Spouse name: Not on file  . Number of children: 5  . Years of education: Not on file  . Highest education level: Not on file  Social Needs  . Financial resource strain: Not on file  . Food insecurity - worry: Not on file  . Food insecurity - inability: Not on file  . Transportation needs - medical: Not on file  . Transportation needs - non-medical: Not on file  Occupational History  . Occupation: Conservator, museum/gallerymanager [employer]   Tobacco Use  . Smoking status: Never Smoker  . Smokeless tobacco: Never Used  Substance and Sexual Activity  . Alcohol use: No  . Drug use: No  . Sexual activity: Not on file  Other Topics Concern  . Not on file  Social History Narrative   divorced 2011, 5 children    household is pt, 3 of the children and wife (remarried)     Family History  Problem  Relation Age of Onset  . Hypertension Father   . Stroke Other        GF  . Aneurysm Other        GM, brain  . CAD Maternal Grandmother   . Colon cancer Neg Hx   . Prostate cancer Neg Hx      Allergies as of 08/11/2017      Reactions   Hydrochlorothiazide Anaphylaxis   Sulfonamide Derivatives Anaphylaxis      Medication List        Accurate as of 08/11/17 11:59 PM. Always use your most recent med list.          benazepril 40 MG tablet Commonly known as:  LOTENSIN Take 1 tablet (40 mg total) by mouth daily.   esomeprazole 40 MG capsule Commonly known as:  NEXIUM Take 1 capsule (40 mg total) by mouth daily.   ranitidine 150 MG capsule Commonly known as:  ZANTAC Take 1 capsule (150 mg total) by mouth daily.   sildenafil 20 MG tablet Commonly known as:  REVATIO Take 3-4 tablets (60-80 mg total) by mouth at bedtime as needed.          Objective:   Physical Exam BP 132/72 (BP Location: Left Arm, Patient  Position: Sitting, Cuff Size: Normal)   Pulse 67   Temp 97.9 F (36.6 C) (Oral)   Resp 14   Ht 5\' 10"  (1.778 m)   Wt 272 lb (123.4 kg)   SpO2 96%   BMI 39.03 kg/m  General:   Well developed, overweight appearing . NAD.  Neck: No  thyromegaly  HEENT:  Normocephalic . Face symmetric, atraumatic Lungs:  CTA B Normal respiratory effort, no intercostal retractions, no accessory muscle use. Heart: RRR,  no murmur.  No pretibial edema bilaterally  Abdomen:  Not distended, soft, non-tender. No rebound or rigidity.   Skin: Exposed areas without rash. Not pale. Not jaundice Neurologic:  alert & oriented X3.  Speech normal, gait appropriate for age and unassisted Strength symmetric and appropriate for age.  Psych: Cognition and judgment appear intact.  Cooperative with normal attention span and concentration.  Behavior appropriate. No anxious or depressed appearing.     Assessment & Plan:   Assessment Prediabetes dx- 2012, A1c 6.4 HTN GERD ED H/o  headaches H/o  stress test 2010 (-)  PLAN Prediabetes:checking labs Morbid obesity: BMI 39 + prediabetes, HTN.  Again encouraged him to change his lifestyle, encourage self learning, see AVS.  I gave him information about local bariatric clinics. HTN: On benazepril, no ambulatory BPs, BP today is very good. RTC 6 months

## 2017-08-11 NOTE — Progress Notes (Signed)
Pre visit review using our clinic review tool, if applicable. No additional management support is needed unless otherwise documented below in the visit note. 

## 2017-08-11 NOTE — Assessment & Plan Note (Addendum)
-  Td 2008, booster today;  flu shot: declined again -BMI 39, risks associated to obesity discussed, strongly encourage change in diet habits.  He is active at work.  Info about the bariatric clinics in the area provided  Labs: BMP, FLP, CBC, TSH, A1c. RTC 6 months

## 2017-08-11 NOTE — Patient Instructions (Signed)
GO TO THE LAB : Get the blood work     GO TO THE FRONT DESK Schedule your next appointment for a checkup in 6 months   DIABETES self learn tools:  Get the book "Diabetes for Dummies" Get the book: "The [facility] Diabetes diet"   Online resources: The American diabetes Association     diabetes.org  Visit [facility] website it is a [employer] resource  joslin.org  The [facility] web site has a diabetes section  [email or web address]

## 2017-08-12 NOTE — Assessment & Plan Note (Signed)
Prediabetes:checking labs Morbid obesity: BMI 39 + prediabetes, HTN.  Again encouraged him to change his lifestyle, encourage self learning, see AVS.  I gave him information about local bariatric clinics. HTN: On benazepril, no ambulatory BPs, BP today is very good. RTC 6 months

## 2017-09-25 ENCOUNTER — Other Ambulatory Visit: Payer: Self-pay

## 2017-09-25 MED ORDER — BENAZEPRIL HCL 40 MG PO TABS: 40.0000 mg | ORAL_TABLET | Freq: Every day | ORAL | 1 refills | Status: DC

## 2018-02-09 ENCOUNTER — Ambulatory Visit: Payer: Managed Care, Other (non HMO) | Admitting: Internal Medicine

## 2018-02-12 ENCOUNTER — Encounter: Payer: Self-pay | Admitting: Internal Medicine

## 2018-04-01 ENCOUNTER — Other Ambulatory Visit: Payer: Self-pay | Admitting: Internal Medicine

## 2018-05-05 ENCOUNTER — Other Ambulatory Visit: Payer: Self-pay | Admitting: Internal Medicine

## 2018-05-17 ENCOUNTER — Other Ambulatory Visit: Payer: Self-pay | Admitting: Internal Medicine

## 2018-06-03 ENCOUNTER — Other Ambulatory Visit: Payer: Self-pay | Admitting: Internal Medicine

## 2018-06-09 ENCOUNTER — Encounter: Payer: Self-pay | Admitting: Internal Medicine

## 2018-06-09 ENCOUNTER — Ambulatory Visit (INDEPENDENT_AMBULATORY_CARE_PROVIDER_SITE_OTHER): Payer: Managed Care, Other (non HMO) | Admitting: Internal Medicine

## 2018-06-09 VITALS — BP 136/88 | HR 89 | Temp 97.9°F | Resp 16 | Ht 70.0 in | Wt 275.1 lb

## 2018-06-09 DIAGNOSIS — I10 Essential (primary) hypertension: Secondary | ICD-10-CM

## 2018-06-09 DIAGNOSIS — R739 Hyperglycemia, unspecified: Secondary | ICD-10-CM

## 2018-06-09 LAB — HEMOGLOBIN A1C: Hgb A1c MFr Bld: 6.2 % (ref 4.6–6.5)

## 2018-06-09 LAB — COMPREHENSIVE METABOLIC PANEL
ALT: 19 U/L (ref 0–53)
AST: 18 U/L (ref 0–37)
Albumin: 4.3 g/dL (ref 3.5–5.2)
Alkaline Phosphatase: 57 U/L (ref 39–117)
BILIRUBIN TOTAL: 0.6 mg/dL (ref 0.2–1.2)
BUN: 15 mg/dL (ref 6–23)
CALCIUM: 9.1 mg/dL (ref 8.4–10.5)
CHLORIDE: 102 meq/L (ref 96–112)
CO2: 31 meq/L (ref 19–32)
Creatinine, Ser: 0.89 mg/dL (ref 0.40–1.50)
GFR: 98.4 mL/min (ref >60.00)
GLUCOSE: 103 mg/dL — AB (ref 70–99)
POTASSIUM: 3.7 meq/L (ref 3.5–5.1)
Sodium: 139 mEq/L (ref 135–145)
Total Protein: 7 g/dL (ref 6.0–8.3)

## 2018-06-09 MED ORDER — BENAZEPRIL HCL 40 MG PO TABS: 40.0000 mg | ORAL_TABLET | Freq: Every day | ORAL | 2 refills | Status: DC

## 2018-06-09 MED ORDER — FAMOTIDINE 20 MG PO TABS: 20.0000 mg | ORAL_TABLET | Freq: Two times a day (BID) | ORAL | 3 refills | Status: DC

## 2018-06-09 NOTE — Progress Notes (Signed)
Pre visit review using our clinic review tool, if applicable. No additional management support is needed unless otherwise documented below in the visit note. 

## 2018-06-09 NOTE — Patient Instructions (Signed)
GO TO THE LAB : Get the blood work     GO TO THE FRONT DESK Schedule your next appointment for a   Physical in 4-5 months

## 2018-06-09 NOTE — Progress Notes (Signed)
Subjective:    Patient ID: Garrett Bell, male    DOB: Feb 10, 1976, 42 y.o.   MRN: [id number]  DOS:  06/09/2018 Type of visit - description : rov Interval history: Since the last office visit he is doing okay. Good med compliance Heartburn is well controlled, unable to get the ranitidine due to a recall.  Alternative?  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 No chest pain no difficulty breathing No nausea, vomiting, diarrhea  Past Medical History:  Diagnosis Date  . Chest pain    neg stress test 01/2009  . Diabetes (HCC)    A1C 6.4 (2012)  . GERD (gastroesophageal reflux disease)   . Headache(784.0)   . HTN (hypertension)   . Traumatic partial tear of right biceps tendon 06/2015    Past Surgical History:  Procedure Laterality Date  . biceps surgery Right 07-04-15   tendon reattachment  . CARPAL TUNNEL RELEASE Bilateral 06/11/2017  . HEMORRHOID SURGERY  07-2008   thrombosed  . VASECTOMY      Social History   Socioeconomic History  . Marital status: Divorced    Spouse name: Not on file  . Number of children: 5  . Years of education: Not on file  . Highest education level: Not on file  Occupational History  . Occupation: Conservator, museum/gallery   Social Needs  . Financial resource strain: Not on file  . Food insecurity:    Worry: Not on file    Inability: Not on file  . Transportation needs:    Medical: Not on file    Non-medical: Not on file  Tobacco Use  . Smoking status: Never Smoker  . Smokeless tobacco: Never Used  Substance and Sexual Activity  . Alcohol use: No  . Drug use: No  . Sexual activity: Not on file  Lifestyle  . Physical activity:    Days per week: Not on file    Minutes per session: Not on file  . Stress: Not on file    Relationships  . Social connections:    Talks on phone: Not on file    Gets together: Not on file    Attends religious service: Not on file    Active member of club or organization: Not on file    Attends meetings of clubs or organizations: Not on file    Relationship status: Not on file  . Intimate partner violence:    Fear of current or ex partner: Not on file    Emotionally abused: Not on file    Physically abused: Not on file    Forced sexual activity: Not on file  Other Topics Concern  . Not on file  Social History Narrative   divorced 2011, 5 children    household is pt, 3 of the children and wife (remarried)      Allergies as of 06/09/2018      Reactions   Hydrochlorothiazide Anaphylaxis   Sulfonamide Derivatives Anaphylaxis      Medication List        Accurate as of 06/09/18 11:09 AM. Always use your most recent med list.          benazepril 40 MG tablet Commonly known as:  LOTENSIN Take 1 tablet (40  mg total) by mouth daily.   esomeprazole 40 MG capsule Commonly known as:  NEXIUM Take 1 capsule (40 mg total) by mouth daily.   ranitidine 150 MG capsule Commonly known as:  ZANTAC Take 1 capsule (150 mg total) by mouth daily.   sildenafil 20 MG tablet Commonly known as:  REVATIO Take 3-4 tablets (60-80 mg total) by mouth at bedtime as needed.          Objective:   Physical Exam BP 136/88 (BP Location: Left Arm, Patient Position: Sitting, Cuff Size: Normal)   Pulse 89   Temp 97.9 F (36.6 C) (Oral)   Resp 16   Ht 5\' 10"  (1.778 m)   Wt 275 lb 2 oz (124.8 kg)   SpO2 98%   BMI 39.48 kg/m  General:   Well developed, NAD, BMI noted. HEENT:  Normocephalic . Face symmetric, atraumatic Lungs:  CTA B Normal respiratory effort, no intercostal retractions, no accessory muscle use. Heart: RRR,  no murmur.  No pretibial edema bilaterally  Skin: Not pale. Not jaundice MSK: Fingers with DJD changes without synovitis at the PIPs and  DIPs Neurologic:  alert & oriented X3.  Speech normal, gait appropriate for age and unassisted Psych--  Cognition and judgment appear intact.  Cooperative with normal attention span and concentration.  Behavior appropriate. No anxious or depressed appearing.      Assessment & Plan:   Assessment Prediabetes dx- 2012, A1c 6.4 HTN GERD DJD: Hands ED H/o headaches H/o  stress test 2010 (-)  PLAN Prediabetes: Checking labs HTN: Continue Lotensin, check a CMP GERD: Continue Nexium, ranitidine was recall, switch to famotidine twice daily as needed Morbid obesity: Again discussed importance to lose weight, information about the wellness center provided. DJD: Hand pain consistent with DJD, sporadic use of Tylenol Advil encouraged. Had a flu shot last week RTC 4 to 5 months CPX

## 2018-06-10 NOTE — Assessment & Plan Note (Signed)
Prediabetes: Checking labs HTN: Continue Lotensin, check a CMP GERD: Continue Nexium, ranitidine was recall, switch to famotidine twice daily as needed Morbid obesity: Again discussed importance to lose weight, information about the wellness center provided. DJD: Hand pain consistent with DJD, sporadic use of Tylenol Advil encouraged. Had a flu shot last week RTC 4 to 5 months CPX

## 2018-09-22 ENCOUNTER — Ambulatory Visit (INDEPENDENT_AMBULATORY_CARE_PROVIDER_SITE_OTHER): Payer: Managed Care, Other (non HMO) | Admitting: Internal Medicine

## 2018-09-22 ENCOUNTER — Encounter: Payer: Self-pay | Admitting: Internal Medicine

## 2018-09-22 DIAGNOSIS — I10 Essential (primary) hypertension: Secondary | ICD-10-CM | POA: Diagnosis not present

## 2018-09-22 DIAGNOSIS — R739 Hyperglycemia, unspecified: Secondary | ICD-10-CM | POA: Diagnosis not present

## 2018-09-22 LAB — BASIC METABOLIC PANEL
BUN: 13 mg/dL (ref 6–23)
CHLORIDE: 100 meq/L (ref 96–112)
CO2: 28 meq/L (ref 19–32)
CREATININE: 0.95 mg/dL (ref 0.40–1.50)
Calcium: 9.2 mg/dL (ref 8.4–10.5)
GFR: 85.75 mL/min (ref >60.00)
GLUCOSE: 85 mg/dL (ref 70–99)
Potassium: 4.4 mEq/L (ref 3.5–5.1)
Sodium: 139 mEq/L (ref 135–145)

## 2018-09-22 LAB — TSH: TSH: 4.74 u[IU]/mL — AB (ref 0.35–4.50)

## 2018-09-22 LAB — HEMOGLOBIN A1C: Hgb A1c MFr Bld: 6 % (ref 4.6–6.5)

## 2018-09-22 MED ORDER — INSULIN PEN NEEDLE 32G X 6 MM MISC: 1 refills | Status: DC

## 2018-09-22 MED ORDER — LIRAGLUTIDE -WEIGHT MANAGEMENT 18 MG/3ML ~~LOC~~ SOPN: 3.0000 mg | PEN_INJECTOR | Freq: Every day | SUBCUTANEOUS | 1 refills | Status: DC

## 2018-09-22 NOTE — Progress Notes (Signed)
Subjective:    Patient ID: Garrett Bell, male    DOB: [birth year], 43 y.o.   MRN: [id number]  DOS:  09/22/2018 Type of visit - description: to discuss wt loss The patient has morbid obesity. On chart review his weigth  was usually  260 pounds but in the last 4 years he has been around 270 pounds. 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 His diet is regular including regular sodas Household include patient, his wife and 3 children which make eating healthy difficult  Wt Readings from Last 3 Encounters:  09/22/18 274 lb 8 oz (124.5 kg)  06/09/18 275 lb 2 oz (124.8 kg)  08/11/17 272 lb (123.4 kg)    Review of Systems Denies chest pain no difficulty breathing No history of personal or FH of thyroid disease  Past Medical History:  Diagnosis Date  . Chest pain    neg stress test 01/2009  . Diabetes (HCC)    A1C 6.4 (2012)  . GERD (gastroesophageal reflux disease)   . Headache(784.0)   . HTN (hypertension)   . Traumatic partial tear of right biceps tendon 06/2015    Past Surgical History:  Procedure Laterality Date  . biceps surgery Right 07-04-15   tendon reattachment  . CARPAL TUNNEL RELEASE Bilateral 06/11/2017  . HEMORRHOID SURGERY  07-2008   thrombosed  . VASECTOMY      Social History   Socioeconomic History  . Marital status: Divorced    Spouse name: Not on file  . Number of children: 5  . Years of education: Not on file  . Highest education level: Not on file  Occupational History  . Occupation: Conservator, museum/gallery   Social Needs  . Financial resource strain: Not on file  . Food insecurity:    Worry: Not on file    Inability: Not on file  . Transportation needs:    Medical: Not on file    Non-medical: Not on file  Tobacco Use  . Smoking status: Never Smoker  . Smokeless tobacco: Never Used  Substance and Sexual Activity  . Alcohol use: No  . Drug use: No  . Sexual activity: Yes  Lifestyle    . Physical activity:    Days per week: Not on file    Minutes per session: Not on file  . Stress: Not on file  Relationships  . Social connections:    Talks on phone: Not on file    Gets together: Not on file    Attends religious service: Not on file    Active member of club or organization: Not on file    Attends meetings of clubs or organizations: Not on file    Relationship status: Not on file  . Intimate partner violence:    Fear of current or ex partner: Not on file    Emotionally abused: Not on file    Physically abused: Not on file    Forced sexual activity: Not on file  Other Topics Concern  . Not on file  Social History Narrative   divorced 2011, 5 children    household is pt, 3 of the children and wife (remarried)      Allergies as of 09/22/2018      Reactions   Hydrochlorothiazide Anaphylaxis   Sulfonamide Derivatives Anaphylaxis      Medication List       Accurate as of September 22, 2018  1:30 PM. Always  use your most recent med list.        benazepril 40 MG tablet Commonly known as:  LOTENSIN Take 1 tablet (40 mg total) by mouth daily.   esomeprazole 40 MG capsule Commonly known as:  NEXIUM Take 1 capsule (40 mg total) by mouth daily.   famotidine 20 MG tablet Commonly known as:  PEPCID Take 1 tablet (20 mg total) by mouth 2 (two) times daily before a meal.   sildenafil 20 MG tablet Commonly known as:  REVATIO Take 3-4 tablets (60-80 mg total) by mouth at bedtime as needed.           Objective:   Physical Exam BP 138/72 (BP Location: Left Arm, Patient Position: Sitting, Cuff Size: Normal)   Pulse 73   Temp 98.3 F (36.8 C) (Oral)   Resp 16   Ht 5\' 10"  (1.778 m)   Wt 274 lb 8 oz (124.5 kg)   SpO2 98%   BMI 39.39 kg/m  General:   Well developed, NAD, BMI noted. HEENT:  Normocephalic . Face symmetric, atraumatic Skin: Not pale. Not jaundice Neurologic:  alert & oriented X3.  Speech normal, gait appropriate for age and  unassisted Psych--  Cognition and judgment appear intact.  Cooperative with normal attention span and concentration.  Behavior appropriate. No anxious or depressed appearing.      Assessment    Assessment Prediabetes dx- 2012, A1c 6.4 HTN GERD DJD: Hands ED H/o headaches H/o  stress test 2010 (-)  PLAN Morbid obesity: The patient has morbid obesity, he started to eat better 4 weeks ago, has not lose weight.  A family member was prescribed Ozempic with very good results and would like to try something to help him lose weight. He is very active at work. He admits that the problem w/ his diet is mostly poor choices like regular sodas and big portions. I discussed with him Bernie Covey which is FDA approved for weight loss, he agreed to try.  No family history of thyroid disease. How to use it & side effects discussed with the patient. I explained him this is his "gold opportunity" to change his habits and make lifelong changes in the way he eats. Recommend self education, consider see a nutritionist, weight watchers?,  Calorie counting?Marland Kitchen The patient seems receptive to the idea. RTC 4 to 5  weeks although he has an appointment with me in April and if he is doing well okay to come back in April.  Today, I spent more than 25   min with the patient: >50% of the time counseling regards diet, exercise, what is Saxenda on how to use it.

## 2018-09-22 NOTE — Patient Instructions (Addendum)
     GO TO THE LAB : Get the blood work     GO TO THE FRONT DESK Schedule your next appointment   for a checkup in 4 to 5 weeks  Start Saxenda: 0.6 mg daily for 1 week 1.2 mg daily for second week 1.8 mg thereafter  GENERAL INFORMATION ABOUT HEALTHY EATING  The American Heart Association, www.heart.org  Check the "Life's Simple 7" from the [facility] The American diabetes Association www.diabetes.org  "[facility] A to Z Health Guide" book    Consider calorie counting (MYFITNESSPAL)  Consider see a nutrionist

## 2018-09-22 NOTE — Progress Notes (Signed)
Pre visit review using our clinic review tool, if applicable. No additional management support is needed unless otherwise documented below in the visit note. 

## 2018-09-23 NOTE — Assessment & Plan Note (Signed)
Morbid obesity: The patient has morbid obesity, he started to eat better 4 weeks ago, has not lose weight.  A family member was prescribed Ozempic with very good results and would like to try something to help him lose weight. He is very active at work. He admits that the problem w/ his diet is mostly poor choices like regular sodas and big portions. I discussed with him Bernie Covey which is FDA approved for weight loss, he agreed to try.  No family history of thyroid disease. How to use it & side effects discussed with the patient. I explained him this is his "gold opportunity" to change his habits and make lifelong changes in the way he eats. Recommend self education, consider see a nutritionist, weight watchers?,  Calorie counting?Marland Kitchen The patient seems receptive to the idea. RTC 4 to 5  weeks although he has an appointment with me in April and if he is doing well okay to come back in April.

## 2018-09-24 ENCOUNTER — Telehealth: Payer: Self-pay | Admitting: Internal Medicine

## 2018-09-24 NOTE — Telephone Encounter (Signed)
Copied from CRM [id number]. Topic: Quick Communication - See Telephone Encounter >> Sep 24, 2018 11:48 AM Trula Slade wrote: CRM for notification. See Telephone encounter for: 09/24/18. Patient would like a return call from Dr. Drue Novel assistant about his Liraglutide -Weight Management (SAXENDA) 18 MG/3ML SOPN medication.  He went to the pharmacy to pick it up and the cost was $1,500.  He would like to know if she did something so his insurance company would pay for the meds.  Please advise.

## 2018-09-24 NOTE — Telephone Encounter (Signed)
Spoke w/ Pt- informed that I tried initiating a PA the other day and informed it wasn't needed. Informed to use copay card that was in sample box- instructed he will need to activate it before taking to pharmacy. Instructed if cost is still an issue to let us know. Pt verbalized understanding.

## 2018-09-25 NOTE — Telephone Encounter (Signed)
Pt called back Saxenda to expensive w/ discount card. Please advise.

## 2018-09-29 NOTE — Telephone Encounter (Signed)
Spoke w/ Pt- informed that he could try Ozempic, recommended he call his insurance first to see if covered and to see what tier it is under, informed the higher the tier the more out of pocket he will pay. Pt informed he will call them to see and let us know if interested.

## 2018-09-29 NOTE — Telephone Encounter (Signed)
We could try Ozempic for treatment of hyperglycemia OZEMPIC: 0.25 mg once weekly for 4 weeks then increase to 0.5 mg once weekly for at least 4 weeks Send a prescription

## 2018-10-01 NOTE — Telephone Encounter (Signed)
Pt called back.  States that Ozempic will be covered by insurance and he would like to try that.  Pt uses  [facility] [address], [place] - [address] [place] [phone] (Phone) [phone] (Fax)

## 2018-10-02 MED ORDER — SEMAGLUTIDE(0.25 OR 0.5MG/DOS) 2 MG/1.5ML ~~LOC~~ SOPN: 0.5000 mg | PEN_INJECTOR | SUBCUTANEOUS | 0 refills | Status: DC

## 2018-10-02 NOTE — Telephone Encounter (Signed)
Spoke w/ Pt- informed that Ozempic has been sent, instructed on use. 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

## 2018-10-02 NOTE — Addendum Note (Signed)
Addended byConrad Lynchburg D on: 10/02/2018 08:46 AM   Modules accepted: Orders

## 2018-10-02 NOTE — Telephone Encounter (Signed)
Please adivse

## 2018-10-02 NOTE — Telephone Encounter (Signed)
Switch to Ozempic 0.25 mg every week for 4 weeks Then increase dose to 0.5 mg weekly. 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

## 2018-10-20 ENCOUNTER — Other Ambulatory Visit: Payer: Self-pay | Admitting: Internal Medicine

## 2018-11-19 ENCOUNTER — Ambulatory Visit (INDEPENDENT_AMBULATORY_CARE_PROVIDER_SITE_OTHER): Payer: Managed Care, Other (non HMO) | Admitting: Internal Medicine

## 2018-11-19 ENCOUNTER — Other Ambulatory Visit: Payer: Self-pay

## 2018-11-19 ENCOUNTER — Encounter: Payer: Self-pay | Admitting: Internal Medicine

## 2018-11-19 DIAGNOSIS — Z0000 Encounter for general adult medical examination without abnormal findings: Secondary | ICD-10-CM

## 2018-11-19 NOTE — Assessment & Plan Note (Signed)
-  Td 2019;  Had a flu shot 2019@ work -No indication for colon or prostate screening.  Asymptomatic, no FH - Labs: CMP, FLP, CBC, TFTs - Doing great with diet and exercise.  See comments under morbid obesity.

## 2018-11-19 NOTE — Progress Notes (Signed)
CMP, FLP, CBC, TSH, T3, T4 DX CPX

## 2018-11-19 NOTE — Progress Notes (Signed)
Subjective:    Patient ID: Garrett Bell, male    DOB: 11/02/1975, 43 y.o.   MRN: [id number]  DOS:  11/19/2018 Type of visit - description: Virtual Visit via Video Note  I connected with@ on 11/19/18 at  8:20 AM EDT by a video enabled telemedicine application and verified that I am speaking with the correct person using two identifiers.   THIS ENCOUNTER IS A VIRTUAL VISIT DUE TO COVID-19 - PATIENT WAS NOT SEEN IN THE OFFICE. PATIENT HAS CONSENTED TO VIRTUAL VISIT / TELEMEDICINE VISIT   Location of patient: home  Location of provider: office  I discussed the limitations of evaluation and management by telemedicine and the availability of in person appointments. The patient expressed understanding and agreed to proceed.  History of Present Illness: CPX Since the last office visit he is doing great, started  Ozempic, has lost almost 30 pounds per his own scales. Diet has improved, has cut down on his soda intake. No recent ambulatory BPs GERD symptoms controlled.    Review of Systems A 14 point review of system was negative.  Specifically no fever chills.  No cough No nausea or vomiting No anxiety or depression. Last week, had diarrhea, saw a "web doctor" from his insurance, diagnosed with acute gastroenteritis, symptoms resolved  Past Medical History:  Diagnosis Date  . Chest pain    neg stress test 01/2009  . Diabetes (HCC)    A1C 6.4 (2012)  . GERD (gastroesophageal reflux disease)   . Headache(784.0)   . HTN (hypertension)   . Traumatic partial tear of right biceps tendon 06/2015    Past Surgical History:  Procedure Laterality Date  . biceps surgery Right 07-04-15   tendon reattachment  . CARPAL TUNNEL RELEASE Bilateral 06/11/2017  . HEMORRHOID SURGERY  07-2008   thrombosed  . VASECTOMY      Social History   Socioeconomic History  . Marital status: Divorced    Spouse name: Not on file  . Number of children: 5  . Years of education: Not on file  . Highest  education level: Not on file  Occupational History  . Occupation: Conservator, museum/gallery   Social Needs  . Financial resource strain: Not on file  . Food insecurity:    Worry: Not on file    Inability: Not on file  . Transportation needs:    Medical: Not on file    Non-medical: Not on file  Tobacco Use  . Smoking status: Never Smoker  . Smokeless tobacco: Never Used  Substance and Sexual Activity  . Alcohol use: No  . Drug use: No  . Sexual activity: Yes  Lifestyle  . Physical activity:    Days per week: Not on file    Minutes per session: Not on file  . Stress: Not on file  Relationships  . Social connections:    Talks on phone: Not on file    Gets together: Not on file    Attends religious service: Not on file    Active member of club or organization: Not on file    Attends meetings of clubs or organizations: Not on file    Relationship status: Not on file  . Intimate partner violence:    Fear of current or ex partner: Not on file    Emotionally abused: Not on file    Physically abused: Not on file    Forced sexual activity: Not on file  Other Topics Concern  . Not on file  Social History Narrative   divorced 2011, 5 children    household is pt, 3 of the children and wife (remarried)     Family History  Problem Relation Age of Onset  . Hypertension Father   . Stroke Other        GF  . Aneurysm Other        GM, brain  . CAD Maternal Grandmother   . Colon cancer Neg Hx   . Prostate cancer Neg Hx      Allergies as of 11/19/2018      Reactions   Hydrochlorothiazide Anaphylaxis   Sulfonamide Derivatives Anaphylaxis      Medication List       Accurate as of November 19, 2018  1:37 PM. Always use your most recent med list.        benazepril 40 MG tablet Commonly known as:  LOTENSIN Take 1 tablet (40 mg total) by mouth daily.   esomeprazole 40 MG capsule Commonly known as:  NEXIUM Take 1 capsule (40 mg total) by mouth daily.   famotidine 20 MG tablet  Commonly known as:  Pepcid Take 1 tablet (20 mg total) by mouth 2 (two) times daily before a meal.   Insulin Pen Needle 32G X 6 MM Misc Commonly known as:  BD Pen Needle Micro U/F To use w/ Saxenda   Semaglutide(0.25 or 0.5MG /DOS) 2 MG/1.5ML Sopn Commonly known as:  Ozempic (0.25 or 0.5 MG/DOSE) Inject 0.5 mg into the skin once a week. Inject 0.25mg  once weekly for 4 weeks, then increase to 0.5mg  once weekly.   sildenafil 20 MG tablet Commonly known as:  REVATIO TAKE 3-4 TABLETS BY MOUTH AT BEDTIME AS NEEDED           Objective:   Physical Exam There were no vitals taken for this visit. This is a virtual visit, video, alert oriented x3 no apparent distress    Assessment     Assessment Prediabetes dx- 2012, A1c 6.4 HTN GERD Morbid obesity: BMI 39+ prediabetes, HTN DJD: Hands ED H/o headaches H/o  stress test 2010 (-)  PLAN Morbid obesity: Doing great on Ozempic, no apparent side effects, has lost 30 pounds per his own scales, feeling great, eating healthy, more active.  Plan: No change, reassess in few months HTN: Continue Lotensin  GERD: Well-controlled on Nexium, Pepcid.  Consider stop 1 of those medications on RTC. RTC for labs within a week, follow-up in 6 months, will arrange       I discussed the assessment and treatment plan with the patient. The patient was provided an opportunity to ask questions and all were answered. The patient agreed with the plan and demonstrated an understanding of the instructions.   The patient was advised to call back or seek an in-person evaluation if the symptoms worsen or if the condition fails to improve as anticipated.

## 2018-11-19 NOTE — Assessment & Plan Note (Signed)
Morbid obesity: Doing great on Ozempic, no apparent side effects, has lost 30 pounds per his own scales, feeling great, eating healthy, more active.  Plan: No change, reassess in few months HTN: Continue Lotensin  GERD: Well-controlled on Nexium, Pepcid.  Consider stop 1 of those medications on RTC. RTC for labs within a week, follow-up in 6 months, will arrange

## 2018-11-20 ENCOUNTER — Other Ambulatory Visit (INDEPENDENT_AMBULATORY_CARE_PROVIDER_SITE_OTHER): Payer: Managed Care, Other (non HMO)

## 2018-11-20 ENCOUNTER — Other Ambulatory Visit: Payer: Self-pay

## 2018-11-20 DIAGNOSIS — Z0000 Encounter for general adult medical examination without abnormal findings: Secondary | ICD-10-CM | POA: Diagnosis not present

## 2018-11-20 LAB — COMPREHENSIVE METABOLIC PANEL
ALT: 20 U/L (ref 0–53)
AST: 16 U/L (ref 0–37)
Albumin: 4.2 g/dL (ref 3.5–5.2)
Alkaline Phosphatase: 61 U/L (ref 39–117)
BUN: 21 mg/dL (ref 6–23)
CO2: 30 mEq/L (ref 19–32)
Calcium: 9.1 mg/dL (ref 8.4–10.5)
Chloride: 103 mEq/L (ref 96–112)
Creatinine, Ser: 0.96 mg/dL (ref 0.40–1.50)
GFR: 84.66 mL/min (ref >60.00)
Glucose, Bld: 90 mg/dL (ref 70–99)
Potassium: 4 mEq/L (ref 3.5–5.1)
Sodium: 140 mEq/L (ref 135–145)
Total Bilirubin: 0.6 mg/dL (ref 0.2–1.2)
Total Protein: 6.5 g/dL (ref 6.0–8.3)

## 2018-11-20 LAB — CBC
HCT: 46.4 % (ref 39.0–52.0)
Hemoglobin: 15.7 g/dL (ref 13.0–17.0)
MCHC: 33.8 g/dL (ref 30.0–36.0)
MCV: 91 fl (ref 78.0–100.0)
Platelets: 134 10*3/uL — ABNORMAL LOW (ref 150.0–400.0)
RBC: 5.09 Mil/uL (ref 4.22–5.81)
RDW: 13.6 % (ref 11.5–15.5)
WBC: 5.1 10*3/uL (ref 4.0–10.5)

## 2018-11-20 LAB — LIPID PANEL
Cholesterol: 190 mg/dL (ref 0–200)
HDL: 37 mg/dL — ABNORMAL LOW (ref >39.00)
LDL Cholesterol: 131 mg/dL — ABNORMAL HIGH (ref 0–99)
NonHDL: 152.89
Total CHOL/HDL Ratio: 5
Triglycerides: 107 mg/dL (ref 0.0–149.0)
VLDL: 21.4 mg/dL (ref 0.0–40.0)

## 2018-11-20 LAB — TSH: TSH: 3.85 u[IU]/mL (ref 0.35–4.50)

## 2018-11-21 LAB — T3: T3, Total: 121 ng/dL (ref 76–181)

## 2018-11-21 LAB — T4: T4, Total: 5.5 ug/dL (ref 4.9–10.5)

## 2019-01-08 ENCOUNTER — Other Ambulatory Visit: Payer: Self-pay

## 2019-01-08 ENCOUNTER — Emergency Department (HOSPITAL_COMMUNITY): Payer: Managed Care, Other (non HMO)

## 2019-01-08 ENCOUNTER — Emergency Department (HOSPITAL_COMMUNITY)
Admission: EM | Admit: 2019-01-08 | Discharge: 2019-01-08 | Disposition: A | Discharge status: Home / Self Care | Payer: Managed Care, Other (non HMO) | Attending: Emergency Medicine | Admitting: Emergency Medicine

## 2019-01-08 ENCOUNTER — Ambulatory Visit: Payer: Self-pay | Admitting: Internal Medicine

## 2019-01-08 DIAGNOSIS — Z794 Long term (current) use of insulin: Secondary | ICD-10-CM | POA: Insufficient documentation

## 2019-01-08 DIAGNOSIS — E119 Type 2 diabetes mellitus without complications: Secondary | ICD-10-CM | POA: Insufficient documentation

## 2019-01-08 DIAGNOSIS — Z79899 Other long term (current) drug therapy: Secondary | ICD-10-CM | POA: Insufficient documentation

## 2019-01-08 DIAGNOSIS — I10 Essential (primary) hypertension: Secondary | ICD-10-CM | POA: Diagnosis not present

## 2019-01-08 DIAGNOSIS — R269 Unspecified abnormalities of gait and mobility: Secondary | ICD-10-CM | POA: Diagnosis present

## 2019-01-08 LAB — CBC WITH DIFFERENTIAL/PLATELET
Abs Immature Granulocytes: 0.02 10*3/uL (ref 0.00–0.07)
Basophils Absolute: 0.1 10*3/uL (ref 0.0–0.1)
Basophils Relative: 1 %
Eosinophils Absolute: 0.2 10*3/uL (ref 0.0–0.5)
Eosinophils Relative: 2 %
HCT: 46.9 % (ref 39.0–52.0)
Hemoglobin: 15.8 g/dL (ref 13.0–17.0)
Immature Granulocytes: 0 %
Lymphocytes Relative: 32 %
Lymphs Abs: 2.3 10*3/uL (ref 0.7–4.0)
MCH: 30.2 pg (ref 26.0–34.0)
MCHC: 33.7 g/dL (ref 30.0–36.0)
MCV: 89.5 fL (ref 80.0–100.0)
Monocytes Absolute: 1 10*3/uL (ref 0.1–1.0)
Monocytes Relative: 13 %
Neutro Abs: 3.7 10*3/uL (ref 1.7–7.7)
Neutrophils Relative %: 52 %
Platelets: 164 10*3/uL (ref 150–400)
RBC: 5.24 MIL/uL (ref 4.22–5.81)
RDW: 12.6 % (ref 11.5–15.5)
WBC: 7.2 10*3/uL (ref 4.0–10.5)
nRBC: 0 % (ref 0.0–0.2)

## 2019-01-08 LAB — HEPATIC FUNCTION PANEL
ALT: 25 U/L (ref 0–44)
AST: 22 U/L (ref 15–41)
Albumin: 4 g/dL (ref 3.5–5.0)
Alkaline Phosphatase: 51 U/L (ref 38–126)
Bilirubin, Direct: 0.2 mg/dL (ref 0.0–0.2)
Indirect Bilirubin: 0.5 mg/dL (ref 0.3–0.9)
Total Bilirubin: 0.7 mg/dL (ref 0.3–1.2)
Total Protein: 7 g/dL (ref 6.5–8.1)

## 2019-01-08 LAB — BASIC METABOLIC PANEL
Anion gap: 7 (ref 5–15)
BUN: 15 mg/dL (ref 6–20)
CO2: 25 mmol/L (ref 22–32)
Calcium: 9.4 mg/dL (ref 8.9–10.3)
Chloride: 106 mmol/L (ref 98–111)
Creatinine, Ser: 0.93 mg/dL (ref 0.61–1.24)
GFR calc Af Amer: >60 mL/min (ref >60)
GFR calc non Af Amer: >60 mL/min (ref >60)
Glucose, Bld: 76 mg/dL (ref 70–99)
Potassium: 4 mmol/L (ref 3.5–5.1)
Sodium: 138 mmol/L (ref 135–145)

## 2019-01-08 LAB — TROPONIN I: Troponin I: <0.03 ng/mL (ref <0.03)

## 2019-01-08 LAB — MAGNESIUM: Magnesium: 2 mg/dL (ref 1.7–2.4)

## 2019-01-08 LAB — CBG MONITORING, ED: Glucose-Capillary: 74 mg/dL (ref 70–99)

## 2019-01-08 MED ORDER — LACTATED RINGERS IV BOLUS
1000.0000 mL | Freq: Once | INTRAVENOUS | Status: AC
  Administered 2019-01-08: 16:00:00 1000 mL via INTRAVENOUS

## 2019-01-08 MED ORDER — HYDROXYZINE HCL 25 MG PO TABS
25.0000 mg | ORAL_TABLET | Freq: Once | ORAL | Status: AC
  Administered 2019-01-08: 16:00:00 25 mg via ORAL
  Filled 2019-01-08: qty 1

## 2019-01-08 NOTE — ED Provider Notes (Signed)
[facility]   CSN: [id number] Arrival date & time: 01/08/19  1504    History   Chief Complaint Chief Complaint  Patient presents with   Gait Problem   Code Stroke    HPI Lenord FellersJacob E Whitt is a 43 y.o. male.     The history is provided by the patient and medical records.  Headache Pain location:  Generalized Quality:  Dull Radiates to:  Does not radiate Severity currently:  5/10 Severity at highest:  7/10 Onset quality:  Sudden Duration:  1 hour Timing:  Constant Progression:  Partially resolved Chronicity:  New Similar to prior headaches: has had headaches in the past that have been much worse.   Context: activity and bright light   Context: not eating, not stress and not loud noise   Relieved by:  Nothing Worsened by:  Nothing Ineffective treatments:  Resting in a darkened room Associated symptoms: blurred vision, loss of balance and photophobia   Associated symptoms: no cough, no diarrhea, no dizziness, no fever, no focal weakness, no nausea, no near-syncope, no numbness, no paresthesias, no seizures, no syncope, no tingling, no vomiting and no weakness   Risk factors: no anger and lifestyle not sedentary     Past Medical History:  Diagnosis Date   Chest pain    neg stress test 01/2009   Diabetes (HCC)    A1C 6.4 (2012)   GERD (gastroesophageal reflux disease)    Headache(784.0)    HTN (hypertension)    Traumatic partial tear of right biceps tendon 06/2015    Patient Active Problem List   Diagnosis Date Noted   Morbid obesity (HCC) 06/10/2018   PCP NOTES >>>>>>>>>>>>>>>>>>>> 10/31/2015   Skin lesion 12/25/2013   CTS (carpal tunnel syndrome) 08/11/2012   Annual physical exam 08/20/2011   Hyperglycemia 08/20/2011   HEADACHE 06/16/2007   Essential hypertension 04/07/2007   GERD 04/07/2007    Past Surgical History:  Procedure Laterality Date   biceps surgery Right 07-04-15   tendon  reattachment   CARPAL TUNNEL RELEASE Bilateral 06/11/2017   HEMORRHOID SURGERY  07-2008   thrombosed   VASECTOMY          Home Medications    Prior to Admission medications   Medication Sig Start Date End Date Taking? Authorizing Provider  benazepril (LOTENSIN) 40 MG tablet Take 1 tablet (40 mg total) by mouth daily. 06/09/18  Yes Paz, Nolon RodJose E, MD  esomeprazole (NEXIUM) 40 MG capsule Take 1 capsule (40 mg total) by mouth daily. 12/30/16  Yes Fayrene Helperran, Bowie, PA-C  famotidine (PEPCID) 20 MG tablet Take 1 tablet (20 mg total) by mouth 2 (two) times daily before a meal. 06/09/18  Yes Paz, Elita QuickJose E, MD  sildenafil (REVATIO) 20 MG tablet TAKE 3-4 TABLETS BY MOUTH AT BEDTIME AS NEEDED Patient taking differently: Take 20 mg by mouth as needed (erectile dysf).  10/20/18  Yes Paz, Nolon RodJose E, MD  Insulin Pen Needle (BD PEN NEEDLE MICRO U/F) 32G X 6 MM MISC To use w/ Saxenda 09/22/18   Wanda PlumpPaz, Jose E, MD  Semaglutide,0.25 or 0.5MG /DOS, (OZEMPIC, 0.25 OR 0.5 MG/DOSE,) 2 MG/1.5ML SOPN Inject 0.5 mg into the skin once a week. Inject 0.25mg  once weekly for 4 weeks, then increase to 0.5mg  once weekly. Patient not taking: Reported on 01/08/2019 10/02/18   Wanda PlumpPaz, Jose E, MD    Family History Family History  Problem Relation Age of Onset   Hypertension Father    Stroke Other  GF   Aneurysm Other        GM, brain   CAD Maternal Grandmother    Colon cancer Neg Hx    Prostate cancer Neg Hx     Social History Social History   Tobacco Use   Smoking status: Never Smoker   Smokeless tobacco: Never Used  Substance Use Topics   Alcohol use: No   Drug use: No     Allergies   Hydrochlorothiazide and Sulfonamide derivatives   Review of Systems Review of Systems  Constitutional: Negative for fever.  Eyes: Positive for blurred vision and photophobia.  Respiratory: Negative for cough.   Cardiovascular: Negative for syncope and near-syncope.  Gastrointestinal: Negative for diarrhea, nausea and  vomiting.  Neurological: Positive for headaches and loss of balance. Negative for dizziness, focal weakness, seizures, weakness, numbness and paresthesias.  All other systems reviewed and are negative.    Physical Exam Updated Vital Signs BP (!) 176/113    Pulse 68    Temp 98.5 F (36.9 C) (Oral)    Resp 19    Ht 5\' 10"  (1.778 m)    Wt 113.4 kg    SpO2 99%    BMI 35.87 kg/m   Physical Exam Vitals signs and nursing note reviewed.  Constitutional:      Appearance: He is well-developed.  HENT:     Head: Normocephalic and atraumatic.  Eyes:     Extraocular Movements: Extraocular movements intact.     Conjunctiva/sclera: Conjunctivae normal.     Pupils: Pupils are equal, round, and reactive to light.     Comments: No obvious nystagmus bilaterally  Neck:     Musculoskeletal: Normal range of motion and neck supple.  Cardiovascular:     Rate and Rhythm: Normal rate.  Pulmonary:     Effort: Pulmonary effort is normal. No respiratory distress.  Abdominal:     Palpations: Abdomen is soft.     Tenderness: There is no abdominal tenderness.  Skin:    General: Skin is warm and dry.  Neurological:     Mental Status: He is alert and oriented to person, place, and time.     Comments: Normal head impulse test bilaterally No obvious skew deviation bilaterally 5/5 motor strength bilateral upper extremities 5/5 motor strength bilateral lower extremities No sensory deficits bilateral upper extremities, bilateral lower extremities 2+ pulses throughout Normal finger-to-nose testing bilateral upper extremities Normal heel-to-shin testing bilateral lower extremities  Patient able to ambulate at baseline.  Patient able to walk heel-to-toe without difficulty, patient able to walk on tippy toes without difficulty, patient able to walk on heels without difficulty Negative Romberg sign. Grossly normal visual acuity, No obvious visual field cuts bilaterally. grossly normal extraocular movements on H  testing Cranial nerves II through XII grossly intact CN 2 (Optic): Visual fields intact to confrontation CN 3,4,6 (EOM): Pupils equal and reactive to light. Full extraocular eye movement without nystagmus.  CN 5 (Trigeminal): Facial sensation is normal, no weakness of masticatory muscles. CN 7 (Facial): No obvious facial droop CN 8 (Auditory): Auditory acuity grossly normal. CN 9,10 (Glossophar): The uvula is midline, the palate elevates symmetrically. CN 11 (spinal access): Head midline CN 12 (Hypoglossal): The tongue is midline.        ED Treatments / Results  Labs (all labs ordered are listed, but only abnormal results are displayed) Labs Reviewed  CBC WITH DIFFERENTIAL/PLATELET  BASIC METABOLIC PANEL  MAGNESIUM  HEPATIC FUNCTION PANEL  TROPONIN I  CBG MONITORING, ED  EKG None  Radiology Ct Head Wo Contrast  Result Date: 01/08/2019 CLINICAL DATA:  PT reports at around 2pm he was walking and had sudden difficulty with gait and was running into stuff. Pt became lightheaded and sat down. After he began having a sharp right headache. The balance and gait problems have resolved.*comment was truncated*Headache, acute, normal neuro exam EXAM: CT HEAD WITHOUT CONTRAST TECHNIQUE: Contiguous axial images were obtained from the base of the skull through the vertex without intravenous contrast. COMPARISON:  Head CT 03/19/2008 FINDINGS: Brain: No acute intracranial hemorrhage. No focal mass lesion. No CT evidence of acute infarction. No midline shift or mass effect. No hydrocephalus. Basilar cisterns are patent. Vascular: No hyperdense vessel or unexpected calcification. Skull: Normal. Negative for fracture or focal lesion. Sinuses/Orbits: Paranasal sinuses and mastoid air cells are clear. Orbits are clear. Other: None. IMPRESSION: Normal head CT. Electronically Signed   By: Genevive BiStewart  Edmunds M.D.   On: 01/08/2019 18:05    Procedures Procedures (including critical care  time)  Medications Ordered in ED Medications  lactated ringers bolus 1,000 mL (0 mLs Intravenous Stopped 01/08/19 1649)  hydrOXYzine (ATARAX/VISTARIL) tablet 25 mg (25 mg Oral Given 01/08/19 1549)     Initial Impression / Assessment and Plan / ED Course  I have reviewed the triage vital signs and the nursing notes.  Pertinent labs & imaging results that were available during my care of the patient were reviewed by me and considered in my medical decision making (see chart for details).        Medical Decision Making: Lenord FellersJacob E Dede is a 43 y.o. male who presented to the ED today status post episode of feeling off balance to the right while ambulating, headache.  Past medical history significant for prediabetes controlled with diet and exercise, hypertension, hyperlipidemia Reviewed and confirmed nursing documentation for past medical history, family history, social history.  Patient grossly back to baseline, occurred while he was walking down the hall, stopped, sat down and rested, gait symptoms resolved but headache started, headache not severe, has had worse headaches in the past.  On my initial exam, the pt was calm, cooperative, conversant, follows commands appropriately, GCS 15, not tachycardic, not hypotensive, afebrile, no increased work of breathing or respiratory distress, no signs of impending respiratory failure.  Full neuro exam reassuring, no focal deficits, patient able to ambulate around the room without difficulty Will give patient fluid bolus, Hydroxyzine.  Clinical picture could be secondary to vasovagal etiology versus dehydration, patient has no concerning signs or symptoms of syncope including exertional chest pain or exertional syncope, no family history of sudden cardiac death or unexplained death, patient has had no chest pain, no tearing chest pain, no back pain no concerning EKG findings, specifically no signs of Brugada, HOCM, preexcitation, no prolonged QT.   Doubt aortic dissection.  Doubt VTE, patient is low risk Wells, no asymmetric bilateral upper extremity or bilateral lower extremity swelling, no postictal period, no loss of consciousness, no incontinence, no witnessed tonic-clonic movements, no seizure history, doubt seizure.  Patient denies any headache prior to work headache now in the ED.  No focal neuro deficits on exam, doubt SAH or ICH.  Patient denies any recent blood loss, no hematemesis, hemoptysis, hematochezia, no dysarthria, no difficulty with ambulation, no hearing loss or tinnitus, doubt posterior circulation stroke, patient denies any alcohol use today or illicit drug use.   All radiology and laboratory studies reviewed independently and with my attending physician, agree with reading provided by  radiologist unless otherwise noted.   Creatinine 0.93, potassium 4.0, sodium 138, mag 2.0, troponin negative, white blood cell 7.2, hemoglobin 15.8 CT head negative.  Upon reassessing patient, patient was calm, resting comfortably, feeling improved. Based on the above findings, I believe patient is hemodynamically stable for discharge  Patient educated about specific return precautions for given chief complaint and symptoms.  Patient educated about follow-up with PCP. Patient expressed understanding of return precautions and need for follow-up.  Patient discharged.  The above care was discussed with and agreed upon by my attending physician. Emergency Department Medication Summary:  Medications  lactated ringers bolus 1,000 mL (0 mLs Intravenous Stopped 01/08/19 1649)  hydrOXYzine (ATARAX/VISTARIL) tablet 25 mg (25 mg Oral Given 01/08/19 1549)    Final Clinical Impressions(s) / ED Diagnoses   Final diagnoses:  Gait abnormality    ED Discharge Orders    None       Lonzo Candy, MD 01/08/19 1831    Little, Wenda Overland, MD 01/10/19 1621

## 2019-01-08 NOTE — ED Triage Notes (Signed)
PT reports at around 2pm he was walking and had sudden difficulty with gait and was running into stuff. Pt became lightheaded and sat down. After he began having a sharp right headache. The balance and gait problems have resolved. PT still has the headache. Symptoms told to MD Little. No Code stroke activation.

## 2019-01-08 NOTE — ED Notes (Signed)
Pt ambulated to the restroom with no issues. Denies discomfort when asked

## 2019-01-08 NOTE — Telephone Encounter (Signed)
Wife called in.  He was walking and ran into some carts and hit his arm.   He is now very sleepy and can't remember how it happened.  Speech clear.  Has a headache and is very  Nauseas.  Wife is on the phone but husband with her.   I instructed her to take him to the ED.  His symptoms are concerning and she agreed.   She is taking him to [facility] ED now.  I sent these notes to Dr. Larose Kells so he would be aware of the ED referral.   Reason for Disposition . Can't remember what happened (amnesia)  Answer Assessment - Initial Assessment Questions 1. MECHANISM: "How did the injury happen?" For falls, ask: "What height did you fall from?" and "What surface did you fall against?"      He was walking along and ran into some carts.   He remembers that part but does not know what happened after that. 2. ONSET: "When did the injury happen?" (Minutes or hours ago)      *No Answer* 3. NEUROLOGIC SYMPTOMS: "Was there any loss of consciousness?" "Are there any other neurological symptoms?"      He doesn't know. 4. MENTAL STATUS: "Does the person know who he is, who you are, and where he is?"      Per wife he is very sleepy and hard to keep awake and he is very nauseas.  Also has a headache.    5. LOCATION: "What part of the head was hit?"      "He didn't hit his head that we can tell" 6. SCALP APPEARANCE: "What does the scalp look like? Is it bleeding now?" If so, ask: "Is it difficult to stop?"      "fine" 7. SIZE: For cuts, bruises, or swelling, ask: "How large is it?" (e.g., inches or centimeters)       8. PAIN: "Is there any pain?" If so, ask: "How bad is it?"  (e.g., Scale 1-10; or mild, moderate, severe)     headache 9. TETANUS: For any breaks in the skin, ask: "When was the last tetanus booster?"     Not asked 10. OTHER SYMPTOMS: "Do you have any other symptoms?" (e.g., neck pain, vomiting)       Nauseas.   Speech is clear and appropriate but slow. 11. PREGNANCY: "Is there any chance you are  pregnant?" "When was your last menstrual period?"       N/A  Protocols used: HEAD INJURY-A-AH

## 2019-01-08 NOTE — Discharge Instructions (Signed)

## 2019-01-08 NOTE — Telephone Encounter (Signed)
FYI

## 2019-01-12 NOTE — Telephone Encounter (Signed)
Noted, w/u neg

## 2019-03-04 ENCOUNTER — Other Ambulatory Visit: Payer: Self-pay | Admitting: Internal Medicine

## 2019-03-15 ENCOUNTER — Other Ambulatory Visit: Payer: Self-pay | Admitting: Internal Medicine

## 2019-06-03 ENCOUNTER — Other Ambulatory Visit: Payer: Self-pay

## 2019-06-03 ENCOUNTER — Ambulatory Visit (INDEPENDENT_AMBULATORY_CARE_PROVIDER_SITE_OTHER): Payer: Managed Care, Other (non HMO) | Admitting: Internal Medicine

## 2019-06-03 DIAGNOSIS — I10 Essential (primary) hypertension: Secondary | ICD-10-CM | POA: Diagnosis not present

## 2019-06-03 NOTE — Progress Notes (Signed)
Subjective:    Patient ID: Garrett Bell, male    DOB: [birth year], 43 y.o.   MRN: [id number]  DOS:  06/03/2019 Type of visit - description: Virtual Visit via Video Note  I connected with@   by a video enabled telemedicine application and verified that I am speaking with the correct person using two identifiers.   THIS ENCOUNTER IS A VIRTUAL VISIT DUE TO COVID-19 - PATIENT WAS NOT SEEN IN THE OFFICE. PATIENT HAS CONSENTED TO VIRTUAL VISIT / TELEMEDICINE VISIT   Location of patient: home  Location of provider: office  I discussed the limitations of evaluation and management by telemedicine and the availability of in person appointments. The patient expressed understanding and agreed to proceed.  History of Present Illness: Follow-up HTN: Good compliance with medication, no ambulatory BPs Morbid obesity: At some point was doing well with Ozempic, then developed diarrhea, stop Ozempic temporarily.  He restarted a low-dose and symptoms come back.  He has stopped it since.    Review of Systems  No concerns. No fever chills No chest pain no difficulty breathing No nausea or vomiting. Diet: not following any particular diet Physical activity: Active at work, no routine exercise.  Past Medical History:  Diagnosis Date  . Chest pain    neg stress test 01/2009  . Diabetes (HCC)    A1C 6.4 (2012)  . GERD (gastroesophageal reflux disease)   . Headache(784.0)   . HTN (hypertension)   . Traumatic partial tear of right biceps tendon 06/2015    Past Surgical History:  Procedure Laterality Date  . biceps surgery Right 07-04-15   tendon reattachment  . CARPAL TUNNEL RELEASE Bilateral 06/11/2017  . HEMORRHOID SURGERY  07-2008   thrombosed  . VASECTOMY      Social History   Socioeconomic History  . Marital status: Divorced    Spouse name: Not on file  . Number of children: 5  . Years of education: Not on file  . Highest education level: Not on file  Occupational History  .  Occupation: Conservator, museum/gallery   Social Needs  . Financial resource strain: Not on file  . Food insecurity    Worry: Not on file    Inability: Not on file  . Transportation needs    Medical: Not on file    Non-medical: Not on file  Tobacco Use  . Smoking status: Never Smoker  . Smokeless tobacco: Never Used  Substance and Sexual Activity  . Alcohol use: No  . Drug use: No  . Sexual activity: Yes  Lifestyle  . Physical activity    Days per week: Not on file    Minutes per session: Not on file  . Stress: Not on file  Relationships  . Social Musician on phone: Not on file    Gets together: Not on file    Attends religious service: Not on file    Active member of club or organization: Not on file    Attends meetings of clubs or organizations: Not on file    Relationship status: Not on file  . Intimate partner violence    Fear of current or ex partner: Not on file    Emotionally abused: Not on file    Physically abused: Not on file    Forced sexual activity: Not on file  Other Topics Concern  . Not on file  Social History Narrative   divorced 2011, 5 children    household is pt,  3 of the children and wife (remarried)      Allergies as of 06/03/2019      Reactions   Hydrochlorothiazide Anaphylaxis   Sulfonamide Derivatives Anaphylaxis      Medication List       Accurate as of June 03, 2019 11:59 PM. If you have any questions, ask your nurse or doctor.        benazepril 40 MG tablet Commonly known as: LOTENSIN Take 1 tablet (40 mg total) by mouth daily.   esomeprazole 40 MG capsule Commonly known as: NEXIUM Take 1 capsule (40 mg total) by mouth daily.   famotidine 20 MG tablet Commonly known as: Pepcid Take 1 tablet (20 mg total) by mouth 2 (two) times daily before a meal.   Insulin Pen Needle 32G X 6 MM Misc Commonly known as: BD Pen Needle Micro U/F To use w/ Saxenda   Semaglutide(0.25 or 0.5MG /DOS) 2 MG/1.5ML Sopn Commonly known as:  Ozempic (0.25 or 0.5 MG/DOSE) Inject 0.5 mg into the skin once a week. Inject 0.25mg  once weekly for 4 weeks, then increase to 0.5mg  once weekly.   sildenafil 20 MG tablet Commonly known as: REVATIO TAKE 3-4 TABLETS BY MOUTH AT BEDTIME AS NEEDED What changed: See the new instructions.           Objective:   Physical Exam There were no vitals taken for this visit. This is a virtual video visit, he is alert oriented x3, in no apparent distress    Assessment     Assessment Prediabetes dx- 2012, A1c 6.4 HTN GERD Morbid obesity: BMI 39+ prediabetes, HTN DJD: Hands ED H/o headaches H/o  stress test 2010 (-)  PLAN Morbid obesity: Was doing well with Ozempic, developed diarrhea, he is stop and restart and symptoms came back.  Does not like to try again.  For now I asked him to focus on diet and physical activity although he does not seem to be motivated.  Also recommend to keep track of his weight HTN: On Lotensin, no ambulatory BPs, encouraged to check regularly. Preventive care: Had a flu shot, COVID-19 precautions discussed. RTC 10/2019 for CPX.      I discussed the assessment and treatment plan with the patient. The patient was provided an opportunity to ask questions and all were answered. The patient agreed with the plan and demonstrated an understanding of the instructions.   The patient was advised to call back or seek an in-person evaluation if the symptoms worsen or if the condition fails to improve as anticipated.

## 2019-07-07 ENCOUNTER — Emergency Department (HOSPITAL_BASED_OUTPATIENT_CLINIC_OR_DEPARTMENT_OTHER): Payer: Managed Care, Other (non HMO)

## 2019-07-07 ENCOUNTER — Ambulatory Visit: Payer: Managed Care, Other (non HMO) | Admitting: Internal Medicine

## 2019-07-07 ENCOUNTER — Ambulatory Visit: Payer: Self-pay | Admitting: *Deleted

## 2019-07-07 ENCOUNTER — Encounter (HOSPITAL_BASED_OUTPATIENT_CLINIC_OR_DEPARTMENT_OTHER): Payer: Self-pay | Admitting: Emergency Medicine

## 2019-07-07 ENCOUNTER — Other Ambulatory Visit: Payer: Self-pay

## 2019-07-07 ENCOUNTER — Emergency Department (HOSPITAL_BASED_OUTPATIENT_CLINIC_OR_DEPARTMENT_OTHER)
Admission: EM | Admit: 2019-07-07 | Discharge: 2019-07-07 | Disposition: A | Discharge status: Home / Self Care | Payer: Managed Care, Other (non HMO) | Attending: Emergency Medicine | Admitting: Emergency Medicine

## 2019-07-07 DIAGNOSIS — I861 Scrotal varices: Secondary | ICD-10-CM | POA: Diagnosis not present

## 2019-07-07 DIAGNOSIS — Z79899 Other long term (current) drug therapy: Secondary | ICD-10-CM | POA: Insufficient documentation

## 2019-07-07 DIAGNOSIS — E119 Type 2 diabetes mellitus without complications: Secondary | ICD-10-CM | POA: Diagnosis not present

## 2019-07-07 DIAGNOSIS — Z888 Allergy status to other drugs, medicaments and biological substances status: Secondary | ICD-10-CM | POA: Insufficient documentation

## 2019-07-07 DIAGNOSIS — I10 Essential (primary) hypertension: Secondary | ICD-10-CM

## 2019-07-07 DIAGNOSIS — R519 Headache, unspecified: Secondary | ICD-10-CM

## 2019-07-07 DIAGNOSIS — Z882 Allergy status to sulfonamides status: Secondary | ICD-10-CM | POA: Diagnosis not present

## 2019-07-07 DIAGNOSIS — G43909 Migraine, unspecified, not intractable, without status migrainosus: Secondary | ICD-10-CM | POA: Diagnosis not present

## 2019-07-07 LAB — URINALYSIS, ROUTINE W REFLEX MICROSCOPIC
Bilirubin Urine: NEGATIVE
Glucose, UA: NEGATIVE mg/dL
Hgb urine dipstick: NEGATIVE
Ketones, ur: NEGATIVE mg/dL
Leukocytes,Ua: NEGATIVE
Nitrite: NEGATIVE
Protein, ur: NEGATIVE mg/dL
Specific Gravity, Urine: 1.015 (ref 1.005–1.030)
pH: 6 (ref 5.0–8.0)

## 2019-07-07 NOTE — Telephone Encounter (Signed)
Pt called with having his blood pressure reading of 205/145 and hr of 113. He stated that he went to the [facility]  to check it and the cuff was small on his arm. He denies having shortness of breath, chest pain, weakness, or blurred vision. He has a headache. He has not missed any doses of his b/p medication. But he states that he is stressed.  He is advised that if he develops these symptoms, or feel worst, he would need to be seen at in the ED. He voiced understanding. Per protocol, he needs to be seen within 4 hours. [facility] at [facility] notified regarding an appointment.  Call conference with the practice. Routing to the office. Reason for Disposition . [6] Systolic BP  >= 861 OR Diastolic >= 683  AND [7] having NO cardiac or neurologic symptoms  Answer Assessment - Initial Assessment Questions 1. BLOOD PRESSURE: "What is the blood pressure?" "Did you take at least two measurements 5 minutes apart?"     205/145 2. ONSET: "When did you take your blood pressure?"    Just now 3. HOW: "How did you obtain the blood pressure?" (e.g., visiting nurse, automatic home BP monitor)     At [facility] 4. HISTORY: "Do you have a history of high blood pressure?"     yes 5. MEDICATIONS: "Are you taking any medications for blood pressure?" "Have you missed any doses recently?"    Yes on medication and not missed any 6. OTHER SYMPTOMS: "Do you have any symptoms?" (e.g., headache, chest pain, blurred vision, difficulty breathing, weakness)     Headache,  7. PREGNANCY: "Is there any chance you are pregnant?" "When was your last menstrual period?"     n/a  Protocols used: HIGH BLOOD PRESSURE-A-AH

## 2019-07-07 NOTE — Telephone Encounter (Signed)
Diastolic BP of 300 is a medical emergency, needs immediate evaluation.

## 2019-07-07 NOTE — ED Triage Notes (Addendum)
Pt states his BP is elevated today.  Pt has been taking sudafed for headache.  Pt is compliant with HTN mediations.  Denies chest pain, no Sob, no visual changes, no dizziness.   Pt states he also has noticed a lump on his right testicle.  No issues with voiding, no pain or swelling.  Pt denies fever.

## 2019-07-07 NOTE — Discharge Instructions (Signed)
Please read and follow all provided instructions.  Your diagnoses today include:  1. Varicocele   2. Essential hypertension   3. Acute nonintractable headache, unspecified headache type     Your blood pressure was high today (BP): BP (!) 170/137 (BP Location: Right Arm)    Pulse (!) 104    Temp 98.6 F (37 C) (Oral)    Resp 20    Ht 5\' 10"  (1.778 m)    Wt 117.9 kg    SpO2 96%    BMI 37.31 kg/m   Tests performed today include:  Vital signs. See below for your results today.   Ultrasound of your scrotum -shows bilateral varicose veins, varicoceles, otherwise the testicles appear healthy  Urine test -no signs of infection or protein in urine  Medications prescribed:   None  Home care instructions:  Follow any educational materials contained in this packet.  Follow-up instructions: Please follow-up with your primary care provider as planned for a recheck of your symptoms and blood pressure.    Return instructions:   Please return to the Emergency Department if you experience worsening symptoms.   Return with severe chest pain, abdominal pain, or shortness of breath.   Return with severe headache, focal weakness, numbness, difficulty with speech or vision.  Return with loss of vision or sudden vision change.  Please return if you have any other emergent concerns.  Additional Information:  Your vital signs today were: BP (!) 170/137 (BP Location: Right Arm)    Pulse (!) 104    Temp 98.6 F (37 C) (Oral)    Resp 20    Ht 5\' 10"  (1.778 m)    Wt 117.9 kg    SpO2 96%    BMI 37.31 kg/m  If your blood pressure (BP) was elevated above 135/85 this visit, please have this repeated by your doctor within one month. --------------

## 2019-07-07 NOTE — ED Provider Notes (Signed)
[facility] EMERGENCY DEPARTMENT Provider Note   CSN: [id number] Arrival date & time: 07/07/19  1140     History   Chief Complaint Chief Complaint  Patient presents with  . Hypertension    HPI Garrett Bell is a 43 y.o. male.     Patient with history of hypertension, borderline diabetes --presents to the emergency with several complaints.  Patient states that he has had some nasal congestion and intermittent headaches over the past 1 week.  Headache is frontal and posterior.  It comes and goes.  It is not severe.  No vision changes, stroke symptoms ( facial droop, slurred speech, aphasia, weakness/numbness in extremities, imbalance/trouble walking), vomiting.  He was taking Sudafed because he thought that he had a sinus infection.  No fevers reported.  Patient is a Freight forwarder at [employer].  He checks his blood pressure and today was elevated.  On arrival to the emergency department is 180/134.  He called his doctor to schedule an appointment and was asked to come to the emergency room for this.  In addition, patient is also anxious because he felt a lump on his right testicle this morning while using the bathroom.  This is not causing any pain.  He has not noted any swelling.  No hematuria, increased frequency urgency, or blood in the urine. Pt had CT in ED 12/2018 which was normal.      Past Medical History:  Diagnosis Date  . Chest pain    neg stress test 01/2009  . Diabetes ([place])    A1C 6.4 (2012)  . GERD (gastroesophageal reflux disease)   . Headache(784.0)   . HTN (hypertension)   . Traumatic partial tear of right biceps tendon 06/2015    Patient Active Problem List   Diagnosis Date Noted  . Morbid obesity ([place]) 06/10/2018  . PCP NOTES >>>>>>>>>>>>>>>>>>>> 10/31/2015  . Skin lesion 12/25/2013  . CTS (carpal tunnel syndrome) 08/11/2012  . Annual physical exam 08/20/2011  . Hyperglycemia 08/20/2011  . HEADACHE 06/16/2007  . Essential hypertension 04/07/2007   . GERD 04/07/2007    Past Surgical History:  Procedure Laterality Date  . biceps surgery Right 07-04-15   tendon reattachment  . CARPAL TUNNEL RELEASE Bilateral 06/11/2017  . HEMORRHOID SURGERY  07-2008   thrombosed  . VASECTOMY          Home Medications    Prior to Admission medications   Medication Sig Start Date End Date Taking? Authorizing Provider  benazepril (LOTENSIN) 40 MG tablet Take 1 tablet (40 mg total) by mouth daily. 03/16/19   Colon Branch, MD  esomeprazole (NEXIUM) 40 MG capsule Take 1 capsule (40 mg total) by mouth daily. 12/30/16   Domenic Moras, PA-C  famotidine (PEPCID) 20 MG tablet Take 1 tablet (20 mg total) by mouth 2 (two) times daily before a meal. 06/09/18   Colon Branch, MD  sildenafil (REVATIO) 20 MG tablet TAKE 3-4 TABLETS BY MOUTH AT BEDTIME AS NEEDED Patient taking differently: Take 20 mg by mouth as needed (erectile dysf).  10/20/18   Colon Branch, MD    Family History Family History  Problem Relation Age of Onset  . Hypertension Father   . Stroke Other        GF  . Aneurysm Other        GM, brain  . CAD Maternal Grandmother   . Colon cancer Neg Hx   . Prostate cancer Neg Hx     Social History Social  History   Tobacco Use  . Smoking status: Never Smoker  . Smokeless tobacco: Never Used  Substance Use Topics  . Alcohol use: No  . Drug use: No     Allergies   Hydrochlorothiazide and Sulfonamide derivatives   Review of Systems Review of Systems  Constitutional: Negative for fever.  HENT: Positive for congestion. Negative for rhinorrhea and sore throat.   Eyes: Negative for redness.  Respiratory: Negative for cough.   Cardiovascular: Negative for chest pain.  Gastrointestinal: Negative for abdominal pain, diarrhea, nausea and vomiting.  Genitourinary: Negative for dysuria, frequency, hematuria, scrotal swelling and testicular pain (+ lump).  Musculoskeletal: Negative for myalgias.  Skin: Negative for rash.  Neurological: Positive  for headaches.     Physical Exam Updated Vital Signs BP (!) 180/134   Pulse (!) 103   Temp 98.6 F (37 C) (Oral)   Resp 20   Ht  (1.778 m)   Wt 117.9 kg   SpO2 98%   BMI 37.31 kg/m   Physical Exam Vitals signs and nursing note reviewed.  Constitutional:      Appearance: He is well-developed.  HENT:     Head: Normocephalic and atraumatic.     Right Ear: Tympanic membrane, ear canal and external ear normal.     Left Ear: Tympanic membrane, ear canal and external ear normal.     Nose: Nose normal.     Mouth/Throat:     Pharynx: Uvula midline.  Eyes:     General: Lids are normal.        Right eye: No discharge.        Left eye: No discharge.     Conjunctiva/sclera: Conjunctivae normal.     Pupils: Pupils are equal, round, and reactive to light.  Neck:     Musculoskeletal: Normal range of motion and neck supple.  Cardiovascular:     Rate and Rhythm: Normal rate and regular rhythm.     Heart sounds: Normal heart sounds.  Pulmonary:     Effort: Pulmonary effort is normal.     Breath sounds: Normal breath sounds.  Abdominal:     Palpations: Abdomen is soft.     Tenderness: There is no abdominal tenderness.  Genitourinary:    Scrotum/Testes:        Right: Mass present. Tenderness or swelling not present.        Left: Mass, tenderness or swelling not present.    Musculoskeletal: Normal range of motion.     Cervical back: He exhibits normal range of motion, no tenderness and no bony tenderness.  Skin:    General: Skin is warm and dry.  Neurological:     Mental Status: He is alert and oriented to person, place, and time.     GCS: GCS eye subscore is 4. GCS verbal subscore is 5. GCS motor subscore is 6.     Cranial Nerves: No cranial nerve deficit.     Sensory: No sensory deficit.     Motor: No abnormal muscle tone.     Coordination: Coordination normal.     Gait: Gait normal.     Deep Tendon Reflexes: Reflexes are normal and symmetric.      ED Treatments  / Results  Labs (all labs ordered are listed, but only abnormal results are displayed) Labs Reviewed  URINALYSIS, ROUTINE W REFLEX MICROSCOPIC    EKG None  Radiology No results found.  Procedures Procedures (including critical care time)  Medications Ordered in ED Medications - No  data to display   Initial Impression / Assessment and Plan / ED Course  I have reviewed the triage vital signs and the nursing notes.  Pertinent labs & imaging results that were available during my care of the patient were reviewed by me and considered in my medical decision making (see chart for details).        Patient seen and examined. Will recheck BP.  Patient without signs of stroke or other clinical signs of endorgan damage.  Will check UA.  Do not feel patient needs additional neuroimaging today.  Will obtain ultrasound of the scrotum given the area that the patient felt today.  He has PCP follow-up in a few days.  Vital signs reviewed and are as follows: BP (!) 180/134   Pulse (!) 103   Temp 98.6 F (37 C) (Oral)   Resp 20   Ht 5\' 10"  (1.778 m)   Wt 117.9 kg   SpO2 98%   BMI 37.31 kg/m   2:09 PM patient updated on all results.  He is sitting in a chair at the bedside in no distress.  Encouraged use of saline rinses or antihistamines for nasal congestion.  Encouraged use of Tylenol and limit use of NSAIDs for headache.  Patient has a PCP appointment scheduled in 2 days.  He is encouraged to follow-up at that time.  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  Normal testicle.  Hypertension and headache: No clinical signs of endorgan damage.  No protein in urine.  Normal neurological exam.  Patient without high-risk features of headache including: sudden onset/thunderclap HA, no similar  headache in past, altered mental status, accompanying seizure, headache with exertion, age > 16, history of immunocompromise, neck or shoulder pain, fever, use of anticoagulation, family history of spontaneous SAH, concomitant drug use, toxic exposure.   Patient has a normal complete neurological exam, normal vital signs, normal level of consciousness, no signs of meningismus, is well-appearing/non-toxic appearing, no signs of trauma.   Imaging with CT/MRI not indicated given history and physical exam findings.   No dangerous or life-threatening conditions suspected or identified by history, physical exam, and by work-up. No indications for hospitalization identified.    ED Discharge Orders    None       42, Renne Crigler 07/07/19 1411    14/09/20, MD 07/08/19 [phone]

## 2019-07-07 NOTE — Telephone Encounter (Signed)
Spoke w/ PCP- informed of elevated HR and BP- he did not want him to wait until 4PM- no sooner appt. Dr. Larose Kells recommended he go on to ED now. I called and spoke w/ Pt- he will go to ED downstairs at [place]- will take him about 20 mins to get there. He has appt already scheduled w/ PCP for 07/09/2019- informed we will keep that visit scheduled for f/u. Pt verbalized understanding and thanked me for call.

## 2019-07-08 ENCOUNTER — Other Ambulatory Visit: Payer: Self-pay

## 2019-07-09 ENCOUNTER — Other Ambulatory Visit: Payer: Self-pay

## 2019-07-09 ENCOUNTER — Ambulatory Visit (INDEPENDENT_AMBULATORY_CARE_PROVIDER_SITE_OTHER): Payer: Managed Care, Other (non HMO) | Admitting: Internal Medicine

## 2019-07-09 DIAGNOSIS — I10 Essential (primary) hypertension: Secondary | ICD-10-CM | POA: Diagnosis not present

## 2019-07-09 DIAGNOSIS — Z20822 Contact with and (suspected) exposure to covid-19: Secondary | ICD-10-CM

## 2019-07-09 NOTE — Progress Notes (Signed)
Subjective:    Patient ID: Garrett Bell, male    DOB: April 20, 1976, 43 y.o.   MRN: [id number]  DOS:  07/09/2019 Type of visit - description: Virtual Visit via Video Note  I connected with@   by a video enabled telemedicine application and verified that I am speaking with the correct person using two identifiers.   THIS ENCOUNTER IS A VIRTUAL VISIT DUE TO COVID-19 - PATIENT WAS NOT SEEN IN THE OFFICE. PATIENT HAS CONSENTED TO VIRTUAL VISIT / TELEMEDICINE VISIT   Location of patient: home  Location of provider: office  I discussed the limitations of evaluation and management by telemedicine and the availability of in person appointments. The patient expressed understanding and agreed to proceed.  History of Present Illness: ED follow-up The idea was the patient to come back in person however he is having some URI symptoms and just got tested for Covid, results pending.  He went to the emergency room 07/07/2019: BP was quite elevated, he had a headache.  Upon arrival BP was 180/134 at the  emergency room. In retrospect, he admits that he was taking OTC decongestant.  Also noted a lump at the testicle.  Respiratory symptoms started a couple of days ago mostly cough on and off, sore throat. Again Covid testing  results are pending   Review of Systems Denies fever chills No nausea, vomiting, diarrhea No myalgias No headaches   Past Medical History:  Diagnosis Date  . Chest pain    neg stress test 01/2009  . Diabetes ([place])    A1C 6.4 (2012)  . GERD (gastroesophageal reflux disease)   . Headache(784.0)   . HTN (hypertension)   . Traumatic partial tear of right biceps tendon 06/2015    Past Surgical History:  Procedure Laterality Date  . biceps surgery Right 07-04-15   tendon reattachment  . CARPAL TUNNEL RELEASE Bilateral 06/11/2017  . HEMORRHOID SURGERY  07-2008   thrombosed  . VASECTOMY      Social History   Socioeconomic History  . Marital status: Married   Spouse name: Not on file  . Number of children: 5  . Years of education: Not on file  . Highest education level: Not on file  Occupational History  . Occupation: Location manager   Tobacco Use  . Smoking status: Never Smoker  . Smokeless tobacco: Never Used  Substance and Sexual Activity  . Alcohol use: No  . Drug use: No  . Sexual activity: Yes  Other Topics Concern  . Not on file  Social History Narrative   divorced 2011, 5 children    household is pt, 3 of the children and wife (remarried)   Social Determinants of Radio broadcast assistant Strain:   . Difficulty of Paying Living Expenses: Not on file  Food Insecurity:   . Worried About Charity fundraiser in the Last Year: Not on file  . Ran Out of Food in the Last Year: Not on file  Transportation Needs:   . Lack of Transportation (Medical): Not on file  . Lack of Transportation (Non-Medical): Not on file  Physical Activity:   . Days of Exercise per Week: Not on file  . Minutes of Exercise per Session: Not on file  Stress:   . Feeling of Stress : Not on file  Social Connections:   . Frequency of Communication with Friends and Family: Not on file  . Frequency of Social Gatherings with Friends and Family: Not on file  .  Attends Religious Services: Not on file  . Active Member of Clubs or Organizations: Not on file  . Attends Banker Meetings: Not on file  . Marital Status: Not on file  Intimate Partner Violence:   . Fear of Current or Ex-Partner: Not on file  . Emotionally Abused: Not on file  . Physically Abused: Not on file  . Sexually Abused: Not on file      Allergies as of 07/09/2019      Reactions   Hydrochlorothiazide Anaphylaxis   Sulfonamide Derivatives Anaphylaxis      Medication List       Accurate as of July 09, 2019  2:35 PM. If you have any questions, ask your nurse or doctor.        benazepril 40 MG tablet Commonly known as: LOTENSIN Take 1 tablet (40 mg total)  by mouth daily.   esomeprazole 40 MG capsule Commonly known as: NEXIUM Take 1 capsule (40 mg total) by mouth daily.   famotidine 20 MG tablet Commonly known as: Pepcid Take 1 tablet (20 mg total) by mouth 2 (two) times daily before a meal.   sildenafil 20 MG tablet Commonly known as: REVATIO TAKE 3-4 TABLETS BY MOUTH AT BEDTIME AS NEEDED What changed: See the new instructions.           Objective:   Physical Exam There were no vitals taken for this visit. This is a virtual video visit, alert oriented x3, walking around his house without any problems    Assessment     Assessment Prediabetes dx- 2012, A1c 6.4 HTN GERD Morbid obesity: BMI 39+ prediabetes, HTN DJD: Hands ED H/o headaches H/o  stress test 2010 (-)  PLAN HTN: Recently went to the ER with a quite elevated BP: 180/134 upon arrival, reports good compliance with Lotensin,  no work-up was done.  (At the time he was taking a decongestant now he has stopped it). He is now at home, has not checked his blood pressure "I know it is now normal, I do not have a headache".   Advised patient importance of checking BPs as elevated BP is often asymptomatic.  He verbalized understanding. Testicle lump: Ultrasound show varicocele, observation recommended URI: Still having respiratory symptoms, Covid testing pending, to let me know if it is positive.  He is taking Coricidin BP. RTC January 2021 we will call and schedule    I discussed the assessment and treatment plan with the patient. The patient was provided an opportunity to ask questions and all were answered. The patient agreed with the plan and demonstrated an understanding of the instructions.   The patient was advised to call back or seek an in-person evaluation if the symptoms worsen or if the condition fails to improve as anticipated.

## 2019-07-11 ENCOUNTER — Encounter (HOSPITAL_BASED_OUTPATIENT_CLINIC_OR_DEPARTMENT_OTHER): Payer: Self-pay | Admitting: Emergency Medicine

## 2019-07-11 ENCOUNTER — Other Ambulatory Visit: Payer: Self-pay

## 2019-07-11 ENCOUNTER — Emergency Department (HOSPITAL_BASED_OUTPATIENT_CLINIC_OR_DEPARTMENT_OTHER)
Admission: EM | Admit: 2019-07-11 | Discharge: 2019-07-11 | Disposition: A | Discharge status: Home / Self Care | Payer: Managed Care, Other (non HMO) | Attending: Emergency Medicine | Admitting: Emergency Medicine

## 2019-07-11 ENCOUNTER — Emergency Department (HOSPITAL_BASED_OUTPATIENT_CLINIC_OR_DEPARTMENT_OTHER): Payer: Managed Care, Other (non HMO)

## 2019-07-11 DIAGNOSIS — F1523 Other stimulant dependence with withdrawal: Secondary | ICD-10-CM

## 2019-07-11 DIAGNOSIS — Z7984 Long term (current) use of oral hypoglycemic drugs: Secondary | ICD-10-CM | POA: Diagnosis not present

## 2019-07-11 DIAGNOSIS — E119 Type 2 diabetes mellitus without complications: Secondary | ICD-10-CM | POA: Diagnosis not present

## 2019-07-11 DIAGNOSIS — G44209 Tension-type headache, unspecified, not intractable: Secondary | ICD-10-CM | POA: Diagnosis not present

## 2019-07-11 DIAGNOSIS — F1593 Other stimulant use, unspecified with withdrawal: Secondary | ICD-10-CM | POA: Diagnosis not present

## 2019-07-11 DIAGNOSIS — I10 Essential (primary) hypertension: Secondary | ICD-10-CM | POA: Insufficient documentation

## 2019-07-11 DIAGNOSIS — Z882 Allergy status to sulfonamides status: Secondary | ICD-10-CM | POA: Insufficient documentation

## 2019-07-11 DIAGNOSIS — R519 Headache, unspecified: Secondary | ICD-10-CM | POA: Diagnosis present

## 2019-07-11 DIAGNOSIS — Z888 Allergy status to other drugs, medicaments and biological substances status: Secondary | ICD-10-CM | POA: Insufficient documentation

## 2019-07-11 DIAGNOSIS — Z79899 Other long term (current) drug therapy: Secondary | ICD-10-CM | POA: Diagnosis not present

## 2019-07-11 LAB — CBC WITH DIFFERENTIAL/PLATELET
Abs Immature Granulocytes: 0.05 10*3/uL (ref 0.00–0.07)
Basophils Absolute: 0.1 10*3/uL (ref 0.0–0.1)
Basophils Relative: 1 %
Eosinophils Absolute: 0 10*3/uL (ref 0.0–0.5)
Eosinophils Relative: 0 %
HCT: 50.9 % (ref 39.0–52.0)
Hemoglobin: 16.9 g/dL (ref 13.0–17.0)
Immature Granulocytes: 1 %
Lymphocytes Relative: 18 %
Lymphs Abs: 1.9 10*3/uL (ref 0.7–4.0)
MCH: 29.9 pg (ref 26.0–34.0)
MCHC: 33.2 g/dL (ref 30.0–36.0)
MCV: 90.1 fL (ref 80.0–100.0)
Monocytes Absolute: 0.7 10*3/uL (ref 0.1–1.0)
Monocytes Relative: 6 %
Neutro Abs: 7.8 10*3/uL — ABNORMAL HIGH (ref 1.7–7.7)
Neutrophils Relative %: 74 %
Platelets: 223 10*3/uL (ref 150–400)
RBC: 5.65 MIL/uL (ref 4.22–5.81)
RDW: 12.3 % (ref 11.5–15.5)
WBC: 10.5 10*3/uL (ref 4.0–10.5)
nRBC: 0 % (ref 0.0–0.2)

## 2019-07-11 LAB — COMPREHENSIVE METABOLIC PANEL
ALT: 25 U/L (ref 0–44)
AST: 21 U/L (ref 15–41)
Albumin: 4.3 g/dL (ref 3.5–5.0)
Alkaline Phosphatase: 62 U/L (ref 38–126)
Anion gap: 11 (ref 5–15)
BUN: 17 mg/dL (ref 6–20)
CO2: 27 mmol/L (ref 22–32)
Calcium: 9.1 mg/dL (ref 8.9–10.3)
Chloride: 100 mmol/L (ref 98–111)
Creatinine, Ser: 0.88 mg/dL (ref 0.61–1.24)
GFR calc Af Amer: >60 mL/min (ref >60)
GFR calc non Af Amer: >60 mL/min (ref >60)
Glucose, Bld: 105 mg/dL — ABNORMAL HIGH (ref 70–99)
Potassium: 4.1 mmol/L (ref 3.5–5.1)
Sodium: 138 mmol/L (ref 135–145)
Total Bilirubin: 0.7 mg/dL (ref 0.3–1.2)
Total Protein: 8.2 g/dL — ABNORMAL HIGH (ref 6.5–8.1)

## 2019-07-11 LAB — NOVEL CORONAVIRUS, NAA: SARS-CoV-2, NAA: NOT DETECTED

## 2019-07-11 LAB — LIPASE, BLOOD: Lipase: 35 U/L (ref 11–51)

## 2019-07-11 MED ORDER — LISINOPRIL 10 MG PO TABS
10.0000 mg | ORAL_TABLET | Freq: Once | ORAL | Status: AC
  Administered 2019-07-11: 18:00:00 10 mg via ORAL
  Filled 2019-07-11: qty 1

## 2019-07-11 MED ORDER — ONDANSETRON HCL 4 MG/2ML IJ SOLN
4.0000 mg | Freq: Once | INTRAMUSCULAR | Status: AC
  Administered 2019-07-11: 17:00:00 4 mg via INTRAVENOUS
  Filled 2019-07-11: qty 2

## 2019-07-11 MED ORDER — SODIUM CHLORIDE 0.9 % IV BOLUS
1000.0000 mL | Freq: Once | INTRAVENOUS | Status: AC
  Administered 2019-07-11: 1000 mL via INTRAVENOUS

## 2019-07-11 MED ORDER — KETOROLAC TROMETHAMINE 30 MG/ML IJ SOLN
30.0000 mg | Freq: Once | INTRAMUSCULAR | Status: AC
  Administered 2019-07-11: 18:00:00 30 mg via INTRAVENOUS
  Filled 2019-07-11: qty 1

## 2019-07-11 MED ORDER — DIPHENHYDRAMINE HCL 50 MG/ML IJ SOLN
25.0000 mg | Freq: Once | INTRAMUSCULAR | Status: AC
  Administered 2019-07-11: 25 mg via INTRAVENOUS
  Filled 2019-07-11: qty 1

## 2019-07-11 MED ORDER — ONDANSETRON 4 MG PO TBDP: 4.0000 mg | ORAL_TABLET | Freq: Three times a day (TID) | ORAL | 0 refills | Status: DC | PRN

## 2019-07-11 MED ORDER — METOCLOPRAMIDE HCL 5 MG/ML IJ SOLN
10.0000 mg | Freq: Once | INTRAMUSCULAR | Status: AC
  Administered 2019-07-11: 10 mg via INTRAVENOUS
  Filled 2019-07-11: qty 2

## 2019-07-11 NOTE — ED Provider Notes (Addendum)
[facility] EMERGENCY DEPARTMENT Provider Note   CSN: [id number] Arrival date & time: 07/11/19  1617     History Chief Complaint  Patient presents with  . Headache  . Emesis    JARID SASSO is a 43 y.o. male.  HPI Patient is a 43 year old male with history of hypertension presented today with headaches which has been intermittent for the past 2 weeks.  Severe today.  Patient states they are frontal, bandlike wraps around to the back of his head with no vision changes, weakness, sensation changes.  Denies any fever or neck stiffness.  Denies any history of severe headaches in the past.  However patient was seen 12/9 diagnosed with sinus headache.  He had elevated blood pressure at that time was told to refrain from taking Sudafed as it was elevating his blood pressure.  Patient had CT scan of his head done 6 months ago due to gait abnormality which was found to be normal at that time.  Patient has any worsening of headache with Valsalva, no morning or evening worsening.  Patient states he has been nauseous today and vomited several times.  States he has been able to tolerate eating and drinking today.  Patient is is a history of loss of caffeine use.  Drinks numerous sodas per day.  Has been cutting back on most recently.  Patient is works at [employer] and states he likely has been exposed to [employer] however states no body aches, fevers, cough's time.  Does endorse some mild congestion.  States that he was tested 2 days ago and told today that he was negative for Covid has not had any exposure since swab was collected.       Past Medical History:  Diagnosis Date  . Chest pain    neg stress test 01/2009  . Diabetes (HCC)    A1C 6.4 (2012)  . GERD (gastroesophageal reflux disease)   . Headache(784.0)   . HTN (hypertension)   . Traumatic partial tear of right biceps tendon 06/2015    Patient Active Problem List   Diagnosis Date Noted  . Morbid obesity (HCC)  06/10/2018  . PCP NOTES >>>>>>>>>>>>>>>>>>>> 10/31/2015  . Skin lesion 12/25/2013  . CTS (carpal tunnel syndrome) 08/11/2012  . Annual physical exam 08/20/2011  . Hyperglycemia 08/20/2011  . HEADACHE 06/16/2007  . Essential hypertension 04/07/2007  . GERD 04/07/2007    Past Surgical History:  Procedure Laterality Date  . biceps surgery Right 07-04-15   tendon reattachment  . CARPAL TUNNEL RELEASE Bilateral 06/11/2017  . HEMORRHOID SURGERY  07-2008   thrombosed  . VASECTOMY         Family History  Problem Relation Age of Onset  . Hypertension Father   . Stroke Other        GF  . Aneurysm Other        GM, brain  . CAD Maternal Grandmother   . Colon cancer Neg Hx   . Prostate cancer Neg Hx     Social History   Tobacco Use  . Smoking status: Never Smoker  . Smokeless tobacco: Never Used  Substance Use Topics  . Alcohol use: No  . Drug use: No    Home Medications Prior to Admission medications   Medication Sig Start Date End Date Taking? Authorizing Provider  benazepril (LOTENSIN) 40 MG tablet Take 1 tablet (40 mg total) by mouth daily. 03/16/19   Wanda Plump, MD  esomeprazole (NEXIUM) 40 MG capsule Take 1  capsule (40 mg total) by mouth daily. 12/30/16   Fayrene Helperran, [place], PA-C  famotidine (PEPCID) 20 MG tablet Take 1 tablet (20 mg total) by mouth 2 (two) times daily before a meal. 06/09/18   Wanda PlumpPaz, Jose E, MD  sildenafil (REVATIO) 20 MG tablet TAKE 3-4 TABLETS BY MOUTH AT BEDTIME AS NEEDED Patient taking differently: Take 20 mg by mouth as needed (erectile dysf).  10/20/18   Wanda PlumpPaz, Jose E, MD    Allergies    Hydrochlorothiazide and Sulfonamide derivatives  Review of Systems   Review of Systems  Constitutional: Negative for chills and fever.  HENT: Negative for congestion.   Eyes: Negative for pain.  Respiratory: Negative for cough and shortness of breath.   Cardiovascular: Negative for chest pain and leg swelling.  Gastrointestinal: Positive for nausea and vomiting.  Negative for abdominal pain.  Genitourinary: Negative for dysuria.  Musculoskeletal: Negative for myalgias and neck pain.  Skin: Negative for rash.  Neurological: Positive for headaches. Negative for dizziness.    Physical Exam Updated Vital Signs BP (!) 183/119 (BP Location: Right Arm)   Pulse 78   Temp 98.3 F (36.8 C) (Oral)   Resp 18   Ht 5\' 10"  (1.778 m)   Wt 117.9 kg   SpO2 97%   BMI 37.31 kg/m   Physical Exam Vitals and nursing note reviewed.  Constitutional:      General: He is not in acute distress. HENT:     Head: Normocephalic and atraumatic.     Nose: Nose normal.     Mouth/Throat:     Pharynx: No posterior oropharyngeal erythema.  Eyes:     General: No scleral icterus. Cardiovascular:     Rate and Rhythm: Normal rate and regular rhythm.     Pulses: Normal pulses.     Heart sounds: Normal heart sounds.  Pulmonary:     Effort: Pulmonary effort is normal. No respiratory distress.     Breath sounds: No wheezing.  Abdominal:     Palpations: Abdomen is soft.     Tenderness: There is no abdominal tenderness. There is no guarding.     Hernia: No hernia is present.  Musculoskeletal:     Cervical back: Normal range of motion.     Right lower leg: No edema.     Left lower leg: No edema.  Skin:    General: Skin is warm and dry.     Capillary Refill: Capillary refill takes less than 2 seconds.  Neurological:     Mental Status: He is alert. Mental status is at baseline.     Comments: Alert and oriented to self, place, time and event.   Speech is fluent, clear without dysarthria or dysphasia.   Strength 5/5 in upper/lower extremities  Sensation intact in upper/lower extremities   Normal gait.  Negative Romberg. No pronator drift.  Normal finger-to-nose and feet tapping.  CN I not tested  CN II grossly intact visual fields bilaterally. Did not visualize posterior eye.   CN III, IV, VI PERRLA and EOMs intact bilaterally  CN V Intact sensation to sharp and  light touch to the face  CN VII facial movements symmetric  CN VIII not tested  CN IX, X no uvula deviation, symmetric rise of soft palate  CN XI 5/5 SCM and trapezius strength bilaterally  CN XII Midline tongue protrusion, symmetric L/R movements   Psychiatric:        Mood and Affect: Mood normal.        Behavior:  Behavior normal.     ED Results / Procedures / Treatments   Labs (all labs ordered are listed, but only abnormal results are displayed) Labs Reviewed  CBC WITH DIFFERENTIAL/PLATELET - Abnormal; Notable for the following components:      Result Value   Neutro Abs 7.8 (*)    All other components within normal limits  COMPREHENSIVE METABOLIC PANEL - Abnormal; Notable for the following components:   Glucose, Bld 105 (*)    Total Protein 8.2 (*)    All other components within normal limits  LIPASE, BLOOD  URINALYSIS, ROUTINE W REFLEX MICROSCOPIC    EKG None  Radiology CT Head Wo Contrast  Result Date: 07/11/2019 CLINICAL DATA:  Headache since 6 a.m.  History of hypertension. EXAM: CT HEAD WITHOUT CONTRAST TECHNIQUE: Contiguous axial images were obtained from the base of the skull through the vertex without intravenous contrast. COMPARISON:  01/08/2019 FINDINGS: Brain: No evidence of acute infarction, hemorrhage, hydrocephalus, extra-axial collection or mass lesion/mass effect. Vascular: No hyperdense vessel or unexpected calcification. Skull: Normal. Negative for fracture or focal lesion. Sinuses/Orbits: Globes and orbits are unremarkable. Visualized sinuses and mastoid air cells are clear. Other: None. IMPRESSION: Normal enhanced CT scan of the brain Electronically Signed   By: Lajean Manes M.D.   On: 07/11/2019 17:58    Procedures Procedures (including critical care time)  Medications Ordered in ED Medications  lisinopril (ZESTRIL) tablet 10 mg (has no administration in time range)  ketorolac (TORADOL) 30 MG/ML injection 30 mg (has no administration in time range)   sodium chloride 0.9 % bolus 1,000 mL (1,000 mLs Intravenous New Bag/Given 07/11/19 1645)  ondansetron (ZOFRAN) injection 4 mg (4 mg Intravenous Given 07/11/19 1646)  metoCLOPramide (REGLAN) injection 10 mg (10 mg Intravenous Given 07/11/19 1740)  diphenhydrAMINE (BENADRYL) injection 25 mg (25 mg Intravenous Given 07/11/19 1740)    ED Course  I have reviewed the triage vital signs and the nursing notes.  Pertinent labs & imaging results that were available during my care of the patient were reviewed by me and considered in my medical decision making (see chart for details).    MDM Rules/Calculators/A&P  Patient presents today with headache that is been intermittent for 2 weeks but constant today.  Patient endorses nausea vomiting today as well as for any reason so brought him in today.  Denies any abdominal pain.  Patient is neurologically intact, well-appearing.  Does have very elevated blood pressure which he attributes this to having to take his benazepril today due to inability to tolerate PO.   Due to patient's lack of headache history mild concern for tumor however this seems unlikely.  Patient's headache is tension type and distribution concerned that this is most likely due to dehydration and caffeine withdrawal as patient drinks copious amounts of soda typically and has been cutting back on this recently.  Patient states he has been peeing less recently likely due to dehydration.  Doubt venous sinus thrombosis, monoxide poisoning, intracranial hemorrhage, patient has a history of cluster headache and has no unilateral tearing or unilateral headache today, patient denies any drug history or alcohol excess, patient has no neck stiffness to indicate meningitis and has no temporal artery tenderness to palpation to indicate temporal arteritis.  No eye pain to indicate glaucoma.  Doubt any other acute cause of this patient's headache today.  Does not seem acutely ill and is improved on my  recheck.   CT scan within normal limits.  Blood work without acute abnormality.  Patient's blood pressure is very elevated at 196/127.  Will reevaluate this before discharge.  Will provide 30 mL of Toradol as well as 10 mg lisinopril which is equivalent to patient's home dose of benazepril.    6:35 PM at time of shift change patient care transitioned to Dr. Stevie Kern for reevaluation and dispo.  Discussed plan with patient to p.o. challenge, provide  Zofran, follow-up with PCP.  Vitals will also be rechecked after lisinopril.     LAYMAN GULLY was evaluated in Emergency Department on 07/11/2019 for the symptoms described in the history of present illness. He was evaluated in the context of the global COVID-19 pandemic, which necessitated consideration that the patient might be at risk for infection with the SARS-CoV-2 virus that causes COVID-19. Institutional protocols and algorithms that pertain to the evaluation of patients at risk for COVID-19 are in a state of rapid change based on information released by regulatory bodies including the CDC and federal and state organizations. These policies and algorithms were followed during the patient's care in the ED.   Final Clinical Impression(s) / ED Diagnoses Final diagnoses:  Caffeine withdrawal (HCC)  Acute non intractable tension-type headache  Hypertension, unspecified type    Rx / DC Orders ED Discharge Orders    None       Gailen Shelter, [place] 07/11/19 1840    Gailen Shelter, [place] 07/11/19 2145    Milagros Loll, MD 07/12/19 [phone]

## 2019-07-11 NOTE — Discharge Instructions (Addendum)
Your CT scan was negative today please follow-up with your primary care doctor.  Suspect that this is likely due to dehydration and caffeine withdrawal however if you have any new or concerning symptoms please return to ED.  Specific concerning symptoms include numbness, blurry vision, slurred speech, weakness in arms or legs, confusion or any other new or concerning symptoms.  Your blood pressure was very elevated today.  You are given lisinopril equivalent dose to your home benazepril.  Please follow-up with your primary care doctor for titration of your blood pressure medication

## 2019-07-11 NOTE — ED Triage Notes (Signed)
Pt c/o headache with vomiting onset today.  Pt seen here for same on the 9th. Pt reports that he recently had a COVID test at [facility] center and it was negative.

## 2019-07-11 NOTE — Assessment & Plan Note (Signed)
HTN: Recently went to the ER with a quite elevated BP: 180/134 upon arrival, reports good compliance with Lotensin,  no work-up was done.  (At the time he was taking a decongestant now he has stopped it). He is now at home, has not checked his blood pressure "I know it is now normal, I do not have a headache".   Advised patient importance of checking BPs as elevated BP is often asymptomatic.  He verbalized understanding. Testicle lump: Ultrasound show varicocele, observation recommended URI: Still having respiratory symptoms, Covid testing pending, to let me know if it is positive.  He is taking Coricidin BP. RTC January 2021 we will call and schedule

## 2019-07-11 NOTE — ED Notes (Signed)
Verified with provider that patient can go home with elevated BP. Dr. Roslynn Amble okay with d/C at this time.

## 2019-07-13 ENCOUNTER — Telehealth: Payer: Self-pay | Admitting: Internal Medicine

## 2019-07-13 ENCOUNTER — Other Ambulatory Visit: Payer: Self-pay

## 2019-07-13 NOTE — Telephone Encounter (Signed)
thx

## 2019-07-13 NOTE — Telephone Encounter (Signed)
Please call the patient, went to the ER, recommend to check BPs at home and have a follow-up in person with me within the next few days. If he continue with headaches or the BPs are quite elevated needs to be seen rather soon.

## 2019-07-13 NOTE — Telephone Encounter (Signed)
Spoke with patient about message he stated he has an appointment with pcp on tomorrow

## 2019-07-14 ENCOUNTER — Other Ambulatory Visit: Payer: Self-pay

## 2019-07-14 ENCOUNTER — Ambulatory Visit (INDEPENDENT_AMBULATORY_CARE_PROVIDER_SITE_OTHER): Payer: Managed Care, Other (non HMO) | Admitting: Internal Medicine

## 2019-07-14 ENCOUNTER — Encounter: Payer: Self-pay | Admitting: Internal Medicine

## 2019-07-14 VITALS — BP 190/140 | HR 90 | Temp 97.8°F | Resp 18 | Ht 70.0 in | Wt 276.2 lb

## 2019-07-14 DIAGNOSIS — I10 Essential (primary) hypertension: Secondary | ICD-10-CM

## 2019-07-14 DIAGNOSIS — I160 Hypertensive urgency: Secondary | ICD-10-CM

## 2019-07-14 DIAGNOSIS — R0683 Snoring: Secondary | ICD-10-CM | POA: Diagnosis not present

## 2019-07-14 DIAGNOSIS — R519 Headache, unspecified: Secondary | ICD-10-CM

## 2019-07-14 MED ORDER — AMLODIPINE BESYLATE 5 MG PO TABS: 5.0000 mg | ORAL_TABLET | Freq: Every day | ORAL | 6 refills | Status: DC

## 2019-07-14 MED ORDER — METOPROLOL TARTRATE 50 MG PO TABS: 50.0000 mg | ORAL_TABLET | Freq: Two times a day (BID) | ORAL | 6 refills | Status: DC

## 2019-07-14 NOTE — Patient Instructions (Addendum)
GO TO THE FRONT DESK Schedule your next appointment   for a checkup in this office next week  ADD Amlodipine 5 mg 1 tablet daily Metoprolol 50 mg 1 tablet twice a day   Check the  blood pressure BID  BP GOAL is between 110/65 and  135/85. If it is consistently higher or lower, let me know   If you have severe headache, nausea, vomiting, dizziness: Go to the ER  For headaches: Stop ibuprofen Tylenol  500 mg OTC 2 tabs a day every 8 hours as needed    DASH Eating Plan DASH stands for "Dietary Approaches to Stop Hypertension." The DASH eating plan is a healthy eating plan that has been shown to reduce high blood pressure (hypertension). It may also reduce your risk for type 2 diabetes, heart disease, and stroke. The DASH eating plan may also help with weight loss. What are tips for following this plan?  General guidelines  Avoid eating more than 2,300 mg (milligrams) of salt (sodium) a day. If you have hypertension, you may need to reduce your sodium intake to 1,500 mg a day.  Limit alcohol intake to no more than 1 drink a day for nonpregnant women and 2 drinks a day for men. One drink equals 12 oz of beer, 5 oz of wine, or 1 oz of hard liquor.  Work with your health care provider to maintain a healthy body weight or to lose weight. Ask what an ideal weight is for you.  Get at least 30 minutes of exercise that causes your heart to beat faster (aerobic exercise) most days of the week. Activities may include walking, swimming, or biking.  Work with your health care provider or diet and nutrition specialist (dietitian) to adjust your eating plan to your individual calorie needs. Reading food labels   Check food labels for the amount of sodium per serving. Choose foods with less than 5 percent of the Daily Value of sodium. Generally, foods with less than 300 mg of sodium per serving fit into this eating plan.  To find whole grains, look for the word "whole" as the first word in the  ingredient list. Shopping  Buy products labeled as "low-sodium" or "no salt added."  Buy fresh foods. Avoid canned foods and premade or frozen meals. Cooking  Avoid adding salt when cooking. Use salt-free seasonings or herbs instead of table salt or sea salt. Check with your health care provider or pharmacist before using salt substitutes.  Do not fry foods. Cook foods using healthy methods such as baking, boiling, grilling, and broiling instead.  Cook with heart-healthy oils, such as olive, canola, soybean, or sunflower oil. Meal planning  Eat a balanced diet that includes: ? 5 or more servings of fruits and vegetables each day. At each meal, try to fill half of your plate with fruits and vegetables. ? Up to 6-8 servings of whole grains each day. ? Less than 6 oz of lean meat, poultry, or fish each day. A 3-oz serving of meat is about the same size as a deck of cards. One egg equals 1 oz. ? 2 servings of low-fat dairy each day. ? A serving of nuts, seeds, or beans 5 times each week. ? Heart-healthy fats. Healthy fats called Omega-3 fatty acids are found in foods such as flaxseeds and coldwater fish, like sardines, salmon, and mackerel.  Limit how much you eat of the following: ? Canned or prepackaged foods. ? Food that is high in trans fat, such  as fried foods. ? Food that is high in saturated fat, such as fatty meat. ? Sweets, desserts, sugary drinks, and other foods with added sugar. ? Full-fat dairy products.  Do not salt foods before eating.  Try to eat at least 2 vegetarian meals each week.  Eat more home-cooked food and less restaurant, buffet, and fast food.  When eating at a restaurant, ask that your food be prepared with less salt or no salt, if possible. What foods are recommended? The items listed may not be a complete list. Talk with your dietitian about what dietary choices are best for you. Grains Whole-grain or whole-wheat bread. Whole-grain or whole-wheat  pasta. Brown rice. Orpah Cobbatmeal. Quinoa. Bulgur. Whole-grain and low-sodium cereals. Pita bread. Low-fat, low-sodium crackers. Whole-wheat flour tortillas. Vegetables Fresh or frozen vegetables (raw, steamed, roasted, or grilled). Low-sodium or reduced-sodium tomato and vegetable juice. Low-sodium or reduced-sodium tomato sauce and tomato paste. Low-sodium or reduced-sodium canned vegetables. Fruits All fresh, dried, or frozen fruit. Canned fruit in natural juice (without added sugar). Meat and other protein foods Skinless chicken or Mal[place]urkey. Ground chicken or [place]urkey. Pork with fat trimmed off. Fish and seafood. Egg whites. Dried beans, peas, or lentils. Unsalted nuts, nut butters, and seeds. Unsalted canned beans. Lean cuts of beef with fat trimmed off. Low-sodium, lean deli meat. Dairy Low-fat (1%) or fat-free (skim) milk. Fat-free, low-fat, or reduced-fat cheeses. Nonfat, low-sodium ricotta or cottage cheese. Low-fat or nonfat yogurt. Low-fat, low-sodium cheese. Fats and oils Soft margarine without trans fats. Vegetable oil. Low-fat, reduced-fat, or light mayonnaise and salad dressings (reduced-sodium). Canola, safflower, olive, soybean, and sunflower oils. Avocado. Seasoning and other foods Herbs. Spices. Seasoning mixes without salt. Unsalted popcorn and pretzels. Fat-free sweets. What foods are not recommended? The items listed may not be a complete list. Talk with your dietitian about what dietary choices are best for you. Grains Baked goods made with fat, such as croissants, muffins, or some breads. Dry pasta or rice meal packs. Vegetables Creamed or fried vegetables. Vegetables in a cheese sauce. Regular canned vegetables (not low-sodium or reduced-sodium). Regular canned tomato sauce and paste (not low-sodium or reduced-sodium). Regular tomato and vegetable juice (not low-sodium or reduced-sodium). Rosita FirePickles. Olives. Fruits Canned fruit in a light or heavy syrup. Fried fruit. Fruit in cream or  butter sauce. Meat and other protein foods Fatty cuts of meat. Ribs. Fried meat. Tomasa BlaseBacon. Sausage. Bologna and other processed lunch meats. Salami. Fatback. Hotdogs. Bratwurst. Salted nuts and seeds. Canned beans with added salt. Canned or smoked fish. Whole eggs or egg yolks. Chicken or Mal[place]urkey with skin. Dairy Whole or 2% milk, cream, and half-and-half. Whole or full-fat cream cheese. Whole-fat or sweetened yogurt. Full-fat cheese. Nondairy creamers. Whipped toppings. Processed cheese and cheese spreads. Fats and oils Butter. Stick margarine. Lard. Shortening. Ghee. Bacon fat. Tropical oils, such as coconut, palm kernel, or palm oil. Seasoning and other foods Salted popcorn and pretzels. Onion salt, garlic salt, seasoned salt, table salt, and sea salt. Worcestershire sauce. Tartar sauce. Barbecue sauce. Teriyaki sauce. Soy sauce, including reduced-sodium. Steak sauce. Canned and packaged gravies. Fish sauce. Oyster sauce. Cocktail sauce. Horseradish that you find on the shelf. Ketchup. Mustard. Meat flavorings and tenderizers. Bouillon cubes. Hot sauce and Tabasco sauce. Premade or packaged marinades. Premade or packaged taco seasonings. Relishes. Regular salad dressings. Where to find more information:  National Heart, Lung, and Blood Institute: PopSteam.iswww.nhlbi.nih.gov  American Heart Association: www.heart.org Summary  The DASH eating plan is a healthy eating plan that has been shown  to reduce high blood pressure (hypertension). It may also reduce your risk for type 2 diabetes, heart disease, and stroke.  With the DASH eating plan, you should limit salt (sodium) intake to 2,300 mg a day. If you have hypertension, you may need to reduce your sodium intake to 1,500 mg a day.  When on the DASH eating plan, aim to eat more fresh fruits and vegetables, whole grains, lean proteins, low-fat dairy, and heart-healthy fats.  Work with your health care provider or diet and nutrition specialist (dietitian) to  adjust your eating plan to your individual calorie needs. This information is not intended to replace advice given to you by your health care provider. Make sure you discuss any questions you have with your health care provider. Document Released: 07/04/2011 Document Revised: 06/27/2017 Document Reviewed: 07/08/2016 Elsevier Patient Education  2020 [employer].

## 2019-07-14 NOTE — Assessment & Plan Note (Signed)
Hypertensive urgency: The patient went to the ER 3 days ago with quite elevated BP and a headache.  Work-up was satisfactory, he was recommended to continue with Lotensin. BP remains elevated at home, today at the office is 190/140. Advised patient that he is at risk of a major problem such as a stroke. Recommend to continue avoiding decongestants, start avoiding NSAIDs, eat low-salt. Continue Lotensin, add amlodipine and metoprolol. H/o anaphylaxis with HCTZ and sulfa, Edecrin in the future? Check BPs twice a day, anticipate BP will be decrease, hopefully at goal or close to goal. Reassess in 1 week. Headache: Likely related to elevated BP, if the headache becomes severe again, go to the ER otherwise take Tylenol as needed.  He has drastically reduced his caffeine intake and that might be having a role in his headaches. Snoring: The patient admits today to severe snoring, refer to Dr. Claiborne Billings, rule out sleep apnea, he is obese and at high risk of OSA. RTC next week

## 2019-07-14 NOTE — Progress Notes (Signed)
Subjective:    Patient ID: Garrett Bell, male    DOB: 07/12/1976, 43 y.o.   MRN: [id number]  DOS:  07/14/2019 Type of visit - description: ER follow-up  This year, the patient was seen in person 09/22/2018, at the time BP was 138/72.  Other visits up until today have been virtual.  Last virtual visit was 07/09/2019 for an ER follow-up, he was seen in the ER with elevated BP.  He went to the ER again 07/11/2019: At the time he presented with a 2-week history of intermittent headaches, no neck stiffness, no previous severe headaches, reports some nausea and vomiting, BP was 183/119, neurological exam was nonfocal, CT head normal, CMP and CBC will essentially normal. ER MD felt like the headache was a tension type and probably caffeine withdrawal since the patient has cut his soda intake significantly.    Wt Readings from Last 3 Encounters:  07/14/19 276 lb 3.2 oz (125.3 kg)  07/11/19 260 lb (117.9 kg)  07/07/19 260 lb (117.9 kg)     Review of Systems Since he left the ER, he has checked his BP at home and it remains elevated. He denies chest pain, difficulty breathing. No edema, no palpitations, no cough. He still have a headache that is mild and is taking ibuprofen for it. He stopped decongestants about a week ago Not taking any OTC herbal supplements. Stress is at baseline. When asked, admits to chronic snoring  Past Medical History:  Diagnosis Date  . Chest pain    neg stress test 01/2009  . Diabetes (HCC)    A1C 6.4 (2012)  . GERD (gastroesophageal reflux disease)   . Headache(784.0)   . HTN (hypertension)   . Traumatic partial tear of right biceps tendon 06/2015    Past Surgical History:  Procedure Laterality Date  . biceps surgery Right 07-04-15   tendon reattachment  . CARPAL TUNNEL RELEASE Bilateral 06/11/2017  . HEMORRHOID SURGERY  07-2008   thrombosed  . VASECTOMY      Social History   Socioeconomic History  . Marital status: Married    Spouse  name: Not on file  . Number of children: 5  . Years of education: Not on file  . Highest education level: Not on file  Occupational History  . Occupation: Conservator, museum/gallerymanager [employer]   Tobacco Use  . Smoking status: Never Smoker  . Smokeless tobacco: Never Used  Substance and Sexual Activity  . Alcohol use: No  . Drug use: No  . Sexual activity: Yes  Other Topics Concern  . Not on file  Social History Narrative   divorced 2011, 5 children    household is pt, 3 of the children and wife (remarried)   Social Determinants of Corporate investment bankerHealth   Financial Resource Strain:   . Difficulty of Paying Living Expenses: Not on file  Food Insecurity:   . Worried About Programme researcher, broadcasting/film/videounning Out of Food in the Last Year: Not on file  . Ran Out of Food in the Last Year: Not on file  Transportation Needs:   . Lack of Transportation (Medical): Not on file  . Lack of Transportation (Non-Medical): Not on file  Physical Activity:   . Days of Exercise per Week: Not on file  . Minutes of Exercise per Session: Not on file  Stress:   . Feeling of Stress : Not on file  Social Connections:   . Frequency of Communication with Friends and Family: Not on file  . Frequency of Social Gatherings  with Friends and Family: Not on file  . Attends Religious Services: Not on file  . Active Member of Clubs or Organizations: Not on file  . Attends Banker Meetings: Not on file  . Marital Status: Not on file  Intimate Partner Violence:   . Fear of Current or Ex-Partner: Not on file  . Emotionally Abused: Not on file  . Physically Abused: Not on file  . Sexually Abused: Not on file      Allergies as of 07/14/2019      Reactions   Hydrochlorothiazide Anaphylaxis   Sulfonamide Derivatives Anaphylaxis      Medication List       Accurate as of July 14, 2019 11:22 AM. If you have any questions, ask your nurse or doctor.        benazepril 40 MG tablet Commonly known as: LOTENSIN Take 1 tablet (40 mg total) by mouth  daily.   esomeprazole 40 MG capsule Commonly known as: NEXIUM Take 1 capsule (40 mg total) by mouth daily.   famotidine 20 MG tablet Commonly known as: Pepcid Take 1 tablet (20 mg total) by mouth 2 (two) times daily before a meal.   ondansetron 4 MG disintegrating tablet Commonly known as: Zofran ODT Take 1 tablet (4 mg total) by mouth every 8 (eight) hours as needed for nausea or vomiting.   sildenafil 20 MG tablet Commonly known as: REVATIO TAKE 3-4 TABLETS BY MOUTH AT BEDTIME AS NEEDED What changed: See the new instructions.           Objective:   Physical Exam BP (!) 190/140 (BP Location: Right Arm, Patient Position: Sitting, Cuff Size: Large)   Pulse 90   Temp 97.8 F (36.6 C) (Temporal)   Resp 18   Ht 5\' 10"  (1.778 m)   Wt 276 lb 3.2 oz (125.3 kg)   SpO2 97%   BMI 39.63 kg/m  General:   Well developed, NAD, BMI noted.  HEENT:  Normocephalic . Face symmetric, atraumatic Lungs:  CTA B Normal respiratory effort, no intercostal retractions, no accessory muscle use. Heart: RRR,  no murmur.  no pretibial edema bilaterally  Abdomen:  Not distended, soft, non-tender. No rebound or rigidity.   Skin: Not pale. Not jaundice Neurologic:  alert & oriented X3.  Speech normal, gait appropriate for age and unassisted Psych--  Cognition and judgment appear intact.  Cooperative with normal attention span and concentration.  Behavior appropriate. No anxious or depressed appearing.     Assessment      Assessment Prediabetes dx- 2012, A1c 6.4 HTN (HCTZ and sulfa) : anaphylaxis,  GERD Morbid obesity: BMI 39+ prediabetes, HTN DJD: Hands ED H/o headaches H/o  stress test 2010 (-)  PLAN Hypertensive urgency: The patient went to the ER 3 days ago with quite elevated BP and a headache.  Work-up was satisfactory, he was recommended to continue with Lotensin. BP remains elevated at home, today at the office is 190/140. Advised patient that he is at risk of a major  problem such as a stroke. Recommend to continue avoiding decongestants, start avoiding NSAIDs, eat low-salt. Continue Lotensin, add amlodipine and metoprolol. H/o anaphylaxis with HCTZ and sulfa, Edecrin in the future? Check BPs twice a day, anticipate BP will be decrease, hopefully at goal or close to goal. Reassess in 1 week. Headache: Likely related to elevated BP, if the headache becomes severe again, go to the ER otherwise take Tylenol as needed.  He has drastically reduced his caffeine intake and  that might be having a role in his headaches. Snoring: The patient admits today to severe snoring, refer to Dr. Claiborne Billings, rule out sleep apnea, he is obese and at high risk of OSA. RTC next week   This visit occurred during the SARS-CoV-2 public health emergency.  Safety protocols were in place, including screening questions prior to the visit, additional usage of staff PPE, and extensive cleaning of exam room while observing appropriate contact time as indicated for disinfecting solutions.

## 2019-07-21 ENCOUNTER — Encounter: Payer: Self-pay | Admitting: Internal Medicine

## 2019-07-21 ENCOUNTER — Ambulatory Visit (INDEPENDENT_AMBULATORY_CARE_PROVIDER_SITE_OTHER): Payer: Managed Care, Other (non HMO) | Admitting: Internal Medicine

## 2019-07-21 ENCOUNTER — Other Ambulatory Visit: Payer: Self-pay

## 2019-07-21 VITALS — BP 162/110 | HR 71 | Temp 97.5°F | Resp 16 | Ht 70.0 in | Wt 273.5 lb

## 2019-07-21 DIAGNOSIS — I10 Essential (primary) hypertension: Secondary | ICD-10-CM | POA: Diagnosis not present

## 2019-07-21 MED ORDER — AMLODIPINE BESYLATE 10 MG PO TABS: 10.0000 mg | ORAL_TABLET | Freq: Every day | ORAL | 6 refills | Status: DC

## 2019-07-21 NOTE — Progress Notes (Signed)
Pre visit review using our clinic review tool, if applicable. No additional management support is needed unless otherwise documented below in the visit note. 

## 2019-07-21 NOTE — Progress Notes (Signed)
Subjective:    Patient ID: Garrett Bell, male    DOB: Feb 10, 1976, 43 y.o.   MRN: [id number]  DOS:  07/21/2019 Type of visit - description: Follow-up HTN: Good compliance with medication, has a hard time remembering to take the second dose of metoprolol. Ambulatory BPs in the 160s over 110. Doing much better with low-salt diet. Not taking NSAIDs  BP Readings from Last 3 Encounters:  07/21/19 (!) 162/110  07/14/19 (!) 190/140  07/11/19 (!) 180/125     Review of Systems Headache resolved No chest pain no difficulty breathing No nausea vomiting No lower extremity edema  Past Medical History:  Diagnosis Date  . Chest pain    neg stress test 01/2009  . Diabetes ([place])    A1C 6.4 (2012)  . GERD (gastroesophageal reflux disease)   . Headache(784.0)   . HTN (hypertension)   . Traumatic partial tear of right biceps tendon 06/2015    Past Surgical History:  Procedure Laterality Date  . biceps surgery Right 07-04-15   tendon reattachment  . CARPAL TUNNEL RELEASE Bilateral 06/11/2017  . HEMORRHOID SURGERY  07-2008   thrombosed  . VASECTOMY      Social History   Socioeconomic History  . Marital status: Married    Spouse name: Not on file  . Number of children: 5  . Years of education: Not on file  . Highest education level: Not on file  Occupational History  . Occupation: Location manager   Tobacco Use  . Smoking status: Never Smoker  . Smokeless tobacco: Never Used  Substance and Sexual Activity  . Alcohol use: No  . Drug use: No  . Sexual activity: Yes  Other Topics Concern  . Not on file  Social History Narrative   divorced 2011, 5 children    household is pt, 3 of the children and wife (remarried)   Social Determinants of Radio broadcast assistant Strain:   . Difficulty of Paying Living Expenses: Not on file  Food Insecurity:   . Worried About Charity fundraiser in the Last Year: Not on file  . Ran Out of Food in the Last Year: Not on file    Transportation Needs:   . Lack of Transportation (Medical): Not on file  . Lack of Transportation (Non-Medical): Not on file  Physical Activity:   . Days of Exercise per Week: Not on file  . Minutes of Exercise per Session: Not on file  Stress:   . Feeling of Stress : Not on file  Social Connections:   . Frequency of Communication with Friends and Family: Not on file  . Frequency of Social Gatherings with Friends and Family: Not on file  . Attends Religious Services: Not on file  . Active Member of Clubs or Organizations: Not on file  . Attends Archivist Meetings: Not on file  . Marital Status: Not on file  Intimate Partner Violence:   . Fear of Current or Ex-Partner: Not on file  . Emotionally Abused: Not on file  . Physically Abused: Not on file  . Sexually Abused: Not on file      Allergies as of 07/21/2019      Reactions   Hydrochlorothiazide Anaphylaxis   Sulfonamide Derivatives Anaphylaxis      Medication List       Accurate as of July 21, 2019 11:59 PM. If you have any questions, ask your nurse or doctor.        amLODipine  10 MG tablet Commonly known as: NORVASC Take 1 tablet (10 mg total) by mouth daily. What changed:   medication strength  how much to take Changed by: Willow Ora, MD   benazepril 40 MG tablet Commonly known as: LOTENSIN Take 1 tablet (40 mg total) by mouth daily.   esomeprazole 40 MG capsule Commonly known as: NEXIUM Take 1 capsule (40 mg total) by mouth daily.   famotidine 20 MG tablet Commonly known as: Pepcid Take 1 tablet (20 mg total) by mouth 2 (two) times daily before a meal.   metoprolol tartrate 50 MG tablet Commonly known as: LOPRESSOR Take 1 tablet (50 mg total) by mouth 2 (two) times daily.   ondansetron 4 MG disintegrating tablet Commonly known as: Zofran ODT Take 1 tablet (4 mg total) by mouth every 8 (eight) hours as needed for nausea or vomiting.   sildenafil 20 MG tablet Commonly known as:  REVATIO TAKE 3-4 TABLETS BY MOUTH AT BEDTIME AS NEEDED What changed: See the new instructions.           Objective:   Physical Exam BP (!) 162/110 (BP Location: Left Arm, Patient Position: Sitting, Cuff Size: Normal)   Pulse 71   Temp (!) 97.5 F (36.4 C) (Temporal)   Resp 16   Ht 5\' 10"  (1.778 m)   Wt 273 lb 8 oz (124.1 kg)   SpO2 94%   BMI 39.24 kg/m  General:   Well developed, NAD, BMI noted. HEENT:  Normocephalic . Face symmetric, atraumatic Lungs:  CTA B Normal respiratory effort, no intercostal retractions, no accessory muscle use. Heart: RRR,  no murmur.  No pretibial edema bilaterally  Skin: Not pale. Not jaundice Neurologic:  alert & oriented X3.  Speech normal, gait appropriate for age and unassisted Psych--  Cognition and judgment appear intact.  Cooperative with normal attention span and concentration.  Behavior appropriate. No anxious or depressed appearing.      Assessment      Assessment Prediabetes dx- 2012, A1c 6.4 HTN (HCTZ and sulfa) : anaphylaxis,  GERD Morbid obesity: BMI 39+ prediabetes, HTN DJD: Hands ED H/o headaches H/o  stress test 2010 (-)  PLAN Hypertension: Improving gradually, no headache, doing better with diet.  Occasionally has a hard time remembering the second dose of metoprolol, offered the extended release Toprol, cost may be an issue, he elected to stay on the rapid release metoprolol. Plan: Continue losartan, metoprolol and increase amlodipine to 10 mg, check BPs daily, reassess in 6 weeks, see cardiology as scheduled. RTC 6 weeks   This visit occurred during the SARS-CoV-2 public health emergency.  Safety protocols were in place, including screening questions prior to the visit, additional usage of staff PPE, and extensive cleaning of exam room while observing appropriate contact time as indicated for disinfecting solutions.

## 2019-07-21 NOTE — Patient Instructions (Signed)
   GO TO THE FRONT DESK Schedule your next appointment   For 6 weeks , routine visit   Check you BP once a day  BP GOAL is between 110/65 and  135/85. If it is consistently higher or lower, let me know

## 2019-07-22 NOTE — Assessment & Plan Note (Signed)
Hypertension: Improving gradually, no headache, doing better with diet.  Occasionally has a hard time remembering the second dose of metoprolol, offered the extended release Toprol, cost may be an issue, he elected to stay on the rapid release metoprolol. Plan: Continue losartan, metoprolol and increase amlodipine to 10 mg, check BPs daily, reassess in 6 weeks, see cardiology as scheduled. RTC 6 weeks

## 2019-08-10 ENCOUNTER — Ambulatory Visit (INDEPENDENT_AMBULATORY_CARE_PROVIDER_SITE_OTHER): Payer: Managed Care, Other (non HMO) | Admitting: Cardiovascular Disease

## 2019-08-10 ENCOUNTER — Encounter: Payer: Self-pay | Admitting: Cardiovascular Disease

## 2019-08-10 ENCOUNTER — Other Ambulatory Visit: Payer: Self-pay

## 2019-08-10 VITALS — BP 138/98 | HR 97 | Temp 99.2°F | Ht 70.0 in | Wt 276.0 lb

## 2019-08-10 DIAGNOSIS — R0683 Snoring: Secondary | ICD-10-CM | POA: Diagnosis not present

## 2019-08-10 DIAGNOSIS — K219 Gastro-esophageal reflux disease without esophagitis: Secondary | ICD-10-CM | POA: Diagnosis not present

## 2019-08-10 DIAGNOSIS — I10 Essential (primary) hypertension: Secondary | ICD-10-CM

## 2019-08-10 MED ORDER — METOPROLOL SUCCINATE ER 50 MG PO TB24: 50.0000 mg | ORAL_TABLET | Freq: Every day | ORAL | 3 refills | Status: DC

## 2019-08-10 NOTE — Patient Instructions (Addendum)
Medication Instructions:  Stop taking Metoprolol Tartrate Start taking 50mg  Metoprolol Succinate daily  If you need a refill on your cardiac medications before your next appointment, please call your pharmacy.   Lab work: Before next appointment, please come get labs drawn: TSH, CMET, Lipids If you have labs (blood work) drawn today and your tests are completely normal, you will receive your results only by: MyChart Message (if you have MyChart) OR A paper copy in the mail If you have any lab test that is abnormal or we need to change your treatment, we will call you to review the results.  Testing/Procedures: Your physician has requested that you have an echocardiogram. Echocardiography is a painless test that uses sound waves to create images of your heart. It provides your doctor with information about the size and shape of your heart and how well your heart's chambers and valves are working. This procedure takes approximately one hour. There are no restrictions for this procedure. [address]  AND  Your physician has requested that you have a renal artery duplex. During this test, an ultrasound is used to evaluate blood flow to the kidneys. Allow one hour for this exam. Do not eat after midnight the day before and avoid carbonated beverages. Take your medications as you usually do.  Follow-Up: At [facility], you and your health needs are our priority.  As part of our continuing mission to provide you with exceptional heart care, we have created designated Provider Care Teams.  These Care Teams include your primary Cardiologist (physician) and Advanced Practice Providers (APPs -  Physician Assistants and Nurse Practitioners) who all work together to provide you with the care you need, when you need it. You may see Dr. [facility] or one of the following Advanced Practice Providers on your designated Care Team:    Tresa Endo, PA-C  Azalee Course, Micah Flesher or   [place],  Judy Pimple  Your physician wants you to follow-up in: 6 weeks with Dr. [place] in the office.

## 2019-08-10 NOTE — Progress Notes (Signed)
Cardiology Office Note    Date:  08/12/2019   ID:  Garrett Bell, DOB July 11, 1976, MRN [id number]  PCP:  Colon Branch, MD  Cardiologist:  Shelva Majestic, MD   Chief Complaint  Patient presents with  . New Patient (Initial Visit)    History of Present Illness:  Garrett Bell is a 44 y.o. male who is referred to the courtesy of Dr. Larose Kells  for further evaluation of hypertension.  Garrett Bell admits to at least a 20-year history of hypertension.  He initially was treated with hydrochlorothiazide but apparently developed a sulfa allergy leading to its discontinuance.  Patient recently admits to increasing weight gain of at least 25 pounds but started a diet approximately 3 to 4 days ago.  On July 11, 2019 he presented to [place] complaining of headaches intermittently over 2 weeks.  He was significantly hypertensive with a blood pressure of 183/119.  CT of his head was normal.  He was given Toradol as well as lisinopril which was equivalent to his home dose of benazepril when he was last seen by Dr. Larose Kells in December 2020 his blood pressure was 162/110 and at the time he was reportedly taking losartan, amlodipine as well as metoprolol tartrate.  There was some difficulty at times with him taking the second dose of metoprolol.  Apparently, now the patient is on an increased amlodipine dose of 10 mg, benazepril 40 mg and was on metoprolol tartrate 50 mg twice a day.  However for the past 2 days he completely discontinued metoprolol since he thought this was contributing to some lightheadedness.  He denied any awareness of a slow pulse.  When he took his blood pressure yesterday it was 137/7.  He denies any chest pain.  He has noticed his heart rate has increased over the last several days.  He denies any PND orthopnea.  He has never had an echo.  Upon questioning him of his sleep he states he typically sleeps 5 to 6 hours per night.  He does snore.  He really cannot sleep on his back.  He  has nocturia at least 1 time per night.  He presents for evaluation   Past Medical History:  Diagnosis Date  . Chest pain    neg stress test 01/2009  . Diabetes ([place])    A1C 6.4 (2012)  . GERD (gastroesophageal reflux disease)   . Headache(784.0)   . HTN (hypertension)   . Traumatic partial tear of right biceps tendon 06/2015    Past Surgical History:  Procedure Laterality Date  . biceps surgery Right 07-04-15   tendon reattachment  . CARPAL TUNNEL RELEASE Bilateral 06/11/2017  . HEMORRHOID SURGERY  07-2008   thrombosed  . VASECTOMY      Current Medications: Outpatient Medications Prior to Visit  Medication Sig Dispense Refill  . amLODipine (NORVASC) 10 MG tablet Take 1 tablet (10 mg total) by mouth daily. 30 tablet 6  . benazepril (LOTENSIN) 40 MG tablet Take 1 tablet (40 mg total) by mouth daily. 90 tablet 3  . esomeprazole (NEXIUM) 40 MG capsule Take 1 capsule (40 mg total) by mouth daily. 30 capsule 0  . famotidine (PEPCID) 20 MG tablet Take 1 tablet (20 mg total) by mouth 2 (two) times daily before a meal. 180 tablet 3  . ondansetron (ZOFRAN ODT) 4 MG disintegrating tablet Take 1 tablet (4 mg total) by mouth every 8 (eight) hours as needed for nausea or vomiting. 20 tablet  0  . sildenafil (REVATIO) 20 MG tablet TAKE 3-4 TABLETS BY MOUTH AT BEDTIME AS NEEDED (Patient taking differently: Take 20 mg by mouth as needed (erectile dysf). ) 50 tablet 1  . metoprolol tartrate (LOPRESSOR) 50 MG tablet Take 1 tablet (50 mg total) by mouth 2 (two) times daily. 60 tablet 6   No facility-administered medications prior to visit.     Allergies:   Hydrochlorothiazide and Sulfonamide derivatives   Social History   Socioeconomic History  . Marital status: Married    Spouse name: Not on file  . Number of children: 5  . Years of education: Not on file  . Highest education level: Not on file  Occupational History  . Occupation: Location manager   Tobacco Use  . Smoking status:  Never Smoker  . Smokeless tobacco: Never Used  Substance and Sexual Activity  . Alcohol use: No  . Drug use: No  . Sexual activity: Yes  Other Topics Concern  . Not on file  Social History Narrative   divorced 2011, 5 children    household is pt, 3 of the children and wife (remarried)   Social Determinants of Radio broadcast assistant Strain:   . Difficulty of Paying Living Expenses: Not on file  Food Insecurity:   . Worried About Charity fundraiser in the Last Year: Not on file  . Ran Out of Food in the Last Year: Not on file  Transportation Needs:   . Lack of Transportation (Medical): Not on file  . Lack of Transportation (Non-Medical): Not on file  Physical Activity:   . Days of Exercise per Week: Not on file  . Minutes of Exercise per Session: Not on file  Stress:   . Feeling of Stress : Not on file  Social Connections:   . Frequency of Communication with Friends and Family: Not on file  . Frequency of Social Gatherings with Friends and Family: Not on file  . Attends Religious Services: Not on file  . Active Member of Clubs or Organizations: Not on file  . Attends Archivist Meetings: Not on file  . Marital Status: Not on file    He is in his second marriage of 5 years.  His children are from his first marriage.  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  Typical workdays are 10 hours.  There is no tobacco or significant alcohol.  He does not exercise.  Family History:  The patient's family history includes Aneurysm in an other family member; CAD in his maternal grandmother; Hypertension in his father; Stroke in an other family member.  's mother is 84 and father is 86.  His father has hypertension.  He has 1 sister age 21.  He has 6 children and 2 grandchildren.  ROS General: Negative; No fevers, chills, or night sweats;  HEENT: Negative; No changes in vision or hearing, sinus congestion, difficulty swallowing Pulmonary: Negative;  No cough, wheezing, shortness of breath, hemoptysis Cardiovascular: Negative; No chest pain, presyncope, syncope, palpitations GI: Negative; No nausea, vomiting, diarrhea, or abdominal pain GU: Negative; No dysuria, hematuria, or difficulty voiding Musculoskeletal: Negative; no myalgias, joint pain, or weakness Hematologic/Oncology: Negative; no easy bruising, bleeding Endocrine: Negative; no heat/cold intolerance; no diabetes Neuro: Negative; no changes in balance, headaches Skin: Negative; No rashes or skin lesions Psychiatric: Negative; No behavioral problems, depression Sleep: Positive for snoring, of occult he sleeping on his back, nocturia 1-2 times per night; no  daytime sleepiness, hypersomnolence, bruxism, restless legs, hypnogognic hallucinations, no cataplexy Other comprehensive 14 point system review is negative.   PHYSICAL EXAM:   VS:  BP (!) 138/98 (BP Location: Left Arm, Patient Position: Sitting, Cuff Size: Large)   Pulse 97   Temp 99.2 F (37.3 C)   Ht _0  (1.778 m)   Wt 276 lb (125.2 kg)   BMI 39.60 kg/m       Wt Readings from Last 3 Encounters:  08/10/19 276 lb (125.2 kg)  07/21/19 273 lb 8 oz (124.1 kg)  07/14/19 276 lb 3.2 oz (125.3 kg)    General: Alert, oriented, no distress.  Skin: normal turgor, no rashes, warm and dry; bearded HEENT: Normocephalic, atraumatic. Pupils equal round and reactive to light; sclera anicteric; extraocular muscles intact; Nose without nasal septal hypertrophy Mouth/Parynx benign; Mallinpatti scale 3/4 Neck: No JVD, no carotid bruits; normal carotid upstroke Lungs: clear to ausculatation and percussion; no wheezing or rales Chest wall: without tenderness to palpitation Heart: PMI not displaced, RRR, s1 s2 normal, 1/6 systolic murmur, no diastolic murmur, no rubs, gallops, thrills, or heaves Abdomen: soft, nontender; no hepatosplenomehaly, BS+; abdominal aorta nontender and not dilated by palpation. Back: no CVA  tenderness Pulses 2+ Musculoskeletal: full range of motion, normal strength, no joint deformities Extremities: no clubbing cyanosis or edema, Homan's sign negative  Neurologic: grossly nonfocal; Cranial nerves grossly wnl Psychologic: Normal mood and affect   Studies/Labs Reviewed:   EKG:  EKG is ordered today.  ECG (independently read by me): Normal sinus rhythm at 97 bpm. Q wave in lead III, nondiagnostic and aVF. Normal intervals. No ectopy  Recent Labs: BMP Latest Ref Rng & Units 07/11/2019 01/08/2019 11/20/2018  Glucose 70 - 99 mg/dL 105(H) 76 90  BUN 6 - 20 mg/dL _1 Creatinine 0.61 - 1.24 mg/dL 0.88 0.93 0.96  Sodium 135 - 145 mmol/L 138 138 140  Potassium 3.5 - 5.1 mmol/L 4.1 4.0 4.0  Chloride 98 - 111 mmol/L 100 106 103  CO2 22 - 32 mmol/L _2 Calcium 8.9 - 10.3 mg/dL 9.1 9.4 9.1     Hepatic Function Latest Ref Rng & Units 07/11/2019 01/08/2019 11/20/2018  Total Protein 6.5 - 8.1 g/dL 8.2(H) 7.0 6.5  Albumin 3.5 - 5.0 g/dL 4.3 4.0 4.2  AST 15 - 41 U/L _3 ALT 0 - 44 U/L _4 Alk Phosphatase 38 - 126 U/L 62 51 61  Total Bilirubin 0.3 - 1.2 mg/dL 0.7 0.7 0.6  Bilirubin, Direct 0.0 - 0.2 mg/dL - 0.2 -    CBC Latest Ref Rng & Units 07/11/2019 01/08/2019 11/20/2018  WBC 4.0 - 10.5 K/uL 10.5 7.2 5.1  Hemoglobin 13.0 - 17.0 g/dL 16.9 15.8 15.7  Hematocrit 39.0 - 52.0 % 50.9 46.9 46.4  Platelets 150 - 400 K/uL 223 164 134.0(L)   Lab Results  Component Value Date   MCV 90.1 07/11/2019   MCV 89.5 01/08/2019   MCV 91.0 11/20/2018   Lab Results  Component Value Date   TSH 3.85 11/20/2018   Lab Results  Component Value Date   HGBA1C 6.0 09/22/2018     BNP No results found for: BNP  ProBNP No results found for: PROBNP   Lipid Panel     Component Value Date/Time   CHOL 190 11/20/2018 0753   TRIG 107.0 11/20/2018 0753   HDL 37.00 (L) 11/20/2018 0753   CHOLHDL 5 11/20/2018 0753   VLDL 21.4 11/20/2018  0753   LDLCALC 131 (H) 11/20/2018  0753   LDLDIRECT 141.8 02/22/2011 0902     RADIOLOGY: No results found.   Additional studies/ records that were reviewed today include:  Viewed the patient's [facility] emergency room evaluation from July 11, 2019.  Records of Dr. Larose Kells were reviewed.   ASSESSMENT:    1. Accelerated hypertension   2. Morbid obesity ([place])   3. Gastroesophageal reflux disease without esophagitis   4. Snoring     PLAN:  Mr. Reagen Haberman is a 44 year old gentleman who has a history of hypertension for at least 20 years.  He also has a history of obesity and with his recent weight gain is borderline morbidly obese with a BMI of 39.60.  He recently has had significant blood pressure exacerbation and has been documented to have systolic blood pressures in the 258N and diastolics at 277-824.  Recently, his medications have been increased to now include benazepril 40 mg, amlodipine 10 mg, and apparently he was taking metoprolol 50 mg twice a day.  He admits that compliance to the twice daily regimen was not very good and for the past 2 days completely stopped taking metoprolol.  His pulse today is increased at 97.  I discussed with him potential for beta-blocker withdrawal leading to exacerbation of blood pressure and increased heart rate.  Repeat blood pressure by me was 146/94.  I discussed the importance of diet and avoidance of sodium.  I am scheduling him for a 2D echo Doppler study to evaluate both systolic and diastolic heart function as well as valvular architecture.  I have recommended resumption of metoprolol but will change him to metoprolol succinate initially at 50 mg.  He may very well require 75 or 100 mg for optimal dosing.  With his significant blood pressure exacerbations, I am also recommending he undergo a renal duplex scan to evaluate potential renal vascular etiology.  I am concerned that he may very well have obstructive sleep apnea which may very well be contributory to his blood  pressure elevation.  I had a long discussion with him regarding sleep apnea and its effects on cardiovascular health and in particular its potential exacerbation of blood pressure control due to frequent nocturnal arousals and increased sympathetic activity.  I discussed with him the consideration of obtaining an initial home sleep study for evaluation.  He will contemplate this but I will not schedule this today.  His BMI is 39.60.  We discussed the importance of weight loss and exercise at least 5 days/week for at least 30 minutes of moderate intensity if at all possible.  I have reviewed his laboratory from his hospital evaluation of July 11, 2019.  Renal function was stable with a BUN of 17 and creatinine 0.88.  Glucose was mildly increased but this was nonfasting.  His CT scan of the brain was normal.  Prior to his follow-up evaluation I have recommended he undergo repeat laboratory with a comprehensive metabolic panel, TSH and lipid studies.  He has COPD and has been on Nexium as well as Pepcid.  I will see him in 6 weeks for follow-up evaluation or sooner as needed.   Medication Adjustments/Labs and Tests Ordered: Current medicines are reviewed at length with the patient today.  Concerns regarding medicines are outlined above.  Medication changes, Labs and Tests ordered today are listed in the Patient Instructions below. Patient Instructions  Medication Instructions:  Stop taking Metoprolol Tartrate Start taking 51m Metoprolol Succinate daily  If you need a refill on your cardiac medications before your next appointment, please call your pharmacy.   Lab work: Before next appointment, please come get labs drawn: TSH, CMET, Lipids If you have labs (blood work) drawn today and your tests are completely normal, you will receive your results only by: [place] (if you have MyChart) OR A paper copy in the mail If you have any lab test that is abnormal or we need to change your treatment,  we will call you to review the results.  Testing/Procedures: Your physician has requested that you have an echocardiogram. Echocardiography is a painless test that uses sound waves to create images of your heart. It provides your doctor with information about the size and shape of your heart and how well your heart's chambers and valves are working. This procedure takes approximately one hour. There are no restrictions for this procedure. [employer] physician has requested that you have a renal artery duplex. During this test, an ultrasound is used to evaluate blood flow to the kidneys. Allow one hour for this exam. Do not eat after midnight the day before and avoid carbonated beverages. Take your medications as you usually do.  Follow-Up: At [facility] you and your health needs are our priority.  As part of our continuing mission to provide you with exceptional heart care, we have created designated Provider Care Teams.  These Care Teams include your primary Cardiologist (physician) and Advanced Practice Providers (APPs -  Physician Assistants and Nurse Practitioners) who all work together to provide you with the care you need, when you need it. You may see Dr. Claiborne Billings or one of the following Advanced Practice Providers on your designated Care Team:    Almyra Deforest, PA-C  Fabian Sharp, [place] or   Roby Lofts, [place]  Your physician wants you to follow-up in: 6 weeks with Dr. Claiborne Billings in the office.         Signed, Shelva Majestic, MD  08/12/2019 7:05 PM    [facility] [address], [place], [place], [place]  [address] Phone: [phone]

## 2019-08-12 ENCOUNTER — Encounter: Payer: Self-pay | Admitting: Cardiovascular Disease

## 2019-08-12 ENCOUNTER — Other Ambulatory Visit (INDEPENDENT_AMBULATORY_CARE_PROVIDER_SITE_OTHER): Payer: Managed Care, Other (non HMO)

## 2019-08-12 DIAGNOSIS — I10 Essential (primary) hypertension: Secondary | ICD-10-CM

## 2019-08-19 ENCOUNTER — Ambulatory Visit (HOSPITAL_COMMUNITY)
Admission: RE | Admit: 2019-08-19 | Discharge: 2019-08-19 | Disposition: A | Discharge status: Home / Self Care | Payer: Managed Care, Other (non HMO) | Source: Ambulatory Visit | Attending: Cardiology | Admitting: Cardiology

## 2019-08-19 ENCOUNTER — Other Ambulatory Visit: Payer: Self-pay

## 2019-08-19 DIAGNOSIS — I10 Essential (primary) hypertension: Secondary | ICD-10-CM | POA: Diagnosis not present

## 2019-08-19 LAB — COMPREHENSIVE METABOLIC PANEL
ALT: 45 IU/L — ABNORMAL HIGH (ref 0–44)
AST: 30 IU/L (ref 0–40)
Albumin/Globulin Ratio: 1.6 (ref 1.2–2.2)
Albumin: 4.3 g/dL (ref 4.0–5.0)
Alkaline Phosphatase: 60 IU/L (ref 39–117)
BUN/Creatinine Ratio: 19 (ref 9–20)
BUN: 19 mg/dL (ref 6–24)
Bilirubin Total: 0.5 mg/dL (ref 0.0–1.2)
CO2: 24 mmol/L (ref 20–29)
Calcium: 9.8 mg/dL (ref 8.7–10.2)
Chloride: 103 mmol/L (ref 96–106)
Creatinine, Ser: 1.01 mg/dL (ref 0.76–1.27)
GFR calc Af Amer: 103 mL/min/{1.73_m2} (ref >59)
GFR calc non Af Amer: 89 mL/min/{1.73_m2} (ref >59)
Globulin, Total: 2.7 g/dL (ref 1.5–4.5)
Glucose: 104 mg/dL — ABNORMAL HIGH (ref 65–99)
Potassium: 4.5 mmol/L (ref 3.5–5.2)
Sodium: 141 mmol/L (ref 134–144)
Total Protein: 7 g/dL (ref 6.0–8.5)

## 2019-08-19 LAB — LIPID PANEL
Chol/HDL Ratio: 4.5 ratio (ref 0.0–5.0)
Cholesterol, Total: 181 mg/dL (ref 100–199)
HDL: 40 mg/dL (ref >39)
LDL Chol Calc (NIH): 127 mg/dL — ABNORMAL HIGH (ref 0–99)
Triglycerides: 76 mg/dL (ref 0–149)
VLDL Cholesterol Cal: 14 mg/dL (ref 5–40)

## 2019-08-19 LAB — TSH: TSH: 3.22 u[IU]/mL (ref 0.450–4.500)

## 2019-08-26 ENCOUNTER — Ambulatory Visit (HOSPITAL_COMMUNITY): Payer: Managed Care, Other (non HMO) | Attending: Cardiology

## 2019-08-26 ENCOUNTER — Other Ambulatory Visit: Payer: Self-pay

## 2019-08-26 DIAGNOSIS — I10 Essential (primary) hypertension: Secondary | ICD-10-CM | POA: Insufficient documentation

## 2019-08-30 ENCOUNTER — Other Ambulatory Visit: Payer: Self-pay | Admitting: *Deleted

## 2019-08-30 DIAGNOSIS — E7800 Pure hypercholesterolemia, unspecified: Secondary | ICD-10-CM

## 2019-08-30 MED ORDER — ATORVASTATIN CALCIUM 20 MG PO TABS: 20.0000 mg | ORAL_TABLET | Freq: Every day | ORAL | 3 refills | Status: DC

## 2019-08-31 ENCOUNTER — Other Ambulatory Visit: Payer: Self-pay

## 2019-09-01 ENCOUNTER — Ambulatory Visit (INDEPENDENT_AMBULATORY_CARE_PROVIDER_SITE_OTHER): Payer: Managed Care, Other (non HMO) | Admitting: Internal Medicine

## 2019-09-01 ENCOUNTER — Other Ambulatory Visit: Payer: Self-pay

## 2019-09-01 ENCOUNTER — Encounter: Payer: Self-pay | Admitting: Internal Medicine

## 2019-09-01 VITALS — BP 123/76 | HR 60 | Temp 98.2°F | Resp 16 | Ht 70.0 in | Wt 269.0 lb

## 2019-09-01 DIAGNOSIS — N529 Male erectile dysfunction, unspecified: Secondary | ICD-10-CM

## 2019-09-01 DIAGNOSIS — I10 Essential (primary) hypertension: Secondary | ICD-10-CM

## 2019-09-01 MED ORDER — SILDENAFIL CITRATE 20 MG PO TABS: 80.0000 mg | ORAL_TABLET | Freq: Every day | ORAL | 6 refills | Status: DC | PRN

## 2019-09-01 NOTE — Progress Notes (Signed)
   Subjective:    Patient ID: Garrett Bell, male    DOB: [birth year], 44 y.o.   MRN: [id number]  DOS:  09/01/2019 Type of visit - description: Follow-up Since the last visit, he is doing well. Good med compliance Saw cardiology, notes and results reviewed  Review of Systems Denies nausea, vomiting, constipation No lower extremity edema No chest pain or difficulty breathing  Past Medical History:  Diagnosis Date  . Chest pain    neg stress test 01/2009  . Diabetes ([facility])    A1C 6.4 (2012)  . GERD (gastroesophageal reflux disease)   . Headache(784.0)   . HTN (hypertension)   . Traumatic partial tear of right biceps tendon 06/2015    Past Surgical History:  Procedure Laterality Date  . biceps surgery Right 07-04-15   tendon reattachment  . CARPAL TUNNEL RELEASE Bilateral 06/11/2017  . HEMORRHOID SURGERY  07-2008   thrombosed  . VASECTOMY          Objective:   Physical Exam BP 123/76 (BP Location: Left Arm, Patient Position: Sitting, Cuff Size: Normal)   Pulse 60   Temp 98.2 F (36.8 C) (Temporal)   Resp 16   Ht 5\' 10"  (1.778 m)   Wt 269 lb (122 kg)   SpO2 95%   BMI 38.60 kg/m  General:   Well developed, NAD, BMI noted. HEENT:  Normocephalic . Face symmetric, atraumatic Lungs:  CTA B Normal respiratory effort, no intercostal retractions, no accessory muscle use. Heart: RRR,  no murmur.  No pretibial edema bilaterally  Skin: Not pale. Not jaundice Neurologic:  alert & oriented X3.  Speech normal, gait appropriate for age and unassisted Psych--  Cognition and judgment appear intact.  Cooperative with normal attention span and concentration.  Behavior appropriate. No anxious or depressed appearing.      Assessment       Assessment Prediabetes dx- 2012, A1c 6.4 HTN (HCTZ and sulfa) : anaphylaxis,  GERD Morbid obesity: BMI 39+ prediabetes, HTN DJD: Hands ED H/o headaches H/o  stress test 2010 (-)  PLAN HTN: Saw cardiology 08/10/2019,Work-up  including: Echo: Mild LVH, grade 1 diastolic dysfunction. 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 Diet has improved. Plan:  ContinueAmlodipine, beta-blockers, Lotensin monitoring BPs.   (He continue with metoprolol twice daily, once  he switch  To extended release beta-blockers as Rx by cardiology continue monitoring BP carefully to be sure it remains controlled) Keep follow-up with cardiology to discuss vascular ultrasound results. ED: Refill sildenafil RTC 4 months CPX   This visit occurred during the SARS-CoV-2 public health emergency.  Safety protocols were in place, including screening questions prior to the visit, additional usage of staff PPE, and extensive cleaning of exam room while observing appropriate contact time as indicated for disinfecting solutions.

## 2019-09-01 NOTE — Progress Notes (Signed)
Pre visit review using our clinic review tool, if applicable. No additional management support is needed unless otherwise documented below in the visit note. 

## 2019-09-01 NOTE — Patient Instructions (Addendum)
  GO TO THE FRONT DESK Come back for a physical in 4 months , please make an appointment   Continue checking your BPs BP GOAL is between 110/65 and  135/85. If it is consistently higher or lower, let me know

## 2019-09-02 NOTE — Assessment & Plan Note (Addendum)
HTN: Saw cardiology 08/10/2019,Work-up including: Echo: Mild LVH, grade 1 diastolic dysfunction. 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 Diet has improved. Plan:  ContinueAmlodipine, beta-blockers, Lotensin & monitoring BPs.   (He continue with metoprolol twice daily, once  he switch to extended release BB as Rx by cardiology continue monitoring BP carefully to be sure it remains controlled) Keep follow-up with cardiology to discuss vascular ultrasound results. ED: Refill sildenafil RTC 4 months CPX

## 2019-09-30 ENCOUNTER — Ambulatory Visit: Payer: Managed Care, Other (non HMO) | Admitting: Cardiovascular Disease

## 2019-10-11 ENCOUNTER — Ambulatory Visit: Payer: Managed Care, Other (non HMO) | Admitting: Physician Assistant

## 2019-10-25 ENCOUNTER — Other Ambulatory Visit: Payer: Self-pay | Admitting: Internal Medicine

## 2019-10-28 ENCOUNTER — Encounter: Payer: Managed Care, Other (non HMO) | Admitting: Physician Assistant

## 2019-10-30 NOTE — Progress Notes (Signed)
This encounter was created in error - please disregard.

## 2019-11-08 ENCOUNTER — Encounter: Payer: Self-pay | Admitting: Physician Assistant

## 2019-11-10 ENCOUNTER — Other Ambulatory Visit: Payer: Self-pay

## 2019-11-11 ENCOUNTER — Encounter: Payer: Managed Care, Other (non HMO) | Admitting: Internal Medicine

## 2019-11-25 ENCOUNTER — Encounter: Payer: Managed Care, Other (non HMO) | Admitting: Internal Medicine

## 2019-11-25 DIAGNOSIS — Z0289 Encounter for other administrative examinations: Secondary | ICD-10-CM

## 2019-12-10 ENCOUNTER — Encounter: Payer: Self-pay | Admitting: Family

## 2019-12-10 ENCOUNTER — Ambulatory Visit (INDEPENDENT_AMBULATORY_CARE_PROVIDER_SITE_OTHER): Payer: Managed Care, Other (non HMO) | Admitting: Family

## 2019-12-10 ENCOUNTER — Other Ambulatory Visit: Payer: Self-pay

## 2019-12-10 VITALS — BP 133/94 | HR 80 | Temp 98.6°F | Resp 16 | Ht 70.0 in | Wt 274.0 lb

## 2019-12-10 DIAGNOSIS — I10 Essential (primary) hypertension: Secondary | ICD-10-CM | POA: Diagnosis not present

## 2019-12-10 DIAGNOSIS — S86002A Unspecified injury of left Achilles tendon, initial encounter: Secondary | ICD-10-CM

## 2019-12-10 NOTE — Progress Notes (Addendum)
Subjective:    Patient ID: Garrett Bell, male    DOB: 17-Jun-1976, 44 y.o.   MRN: [id number]  HPI   Patient is a 44 yr old male with chief complaint of left achilles pain. Thinks he had an achilles injury 3-4 years back. Pain ultimately resolved until recently. States that pain started back up recently after he moved. He was carrying a lot of boxes and climbing stairs. States he has taken a few ibuprofen without significant relief.    Review of Systems See HPI  Past Medical History:  Diagnosis Date  . Chest pain    neg stress test 01/2009  . Diabetes ([facility])    A1C 6.4 (2012)  . GERD (gastroesophageal reflux disease)   . Headache(784.0)   . HTN (hypertension)   . Traumatic partial tear of right biceps tendon 06/2015     Social History   Socioeconomic History  . Marital status: Married    Spouse name: Not on file  . Number of children: 5  . Years of education: Not on file  . Highest education level: Not on file  Occupational History  . Occupation: Conservator, museum/gallery   Tobacco Use  . Smoking status: Never Smoker  . Smokeless tobacco: Never Used  Substance and Sexual Activity  . Alcohol use: No  . Drug use: No  . Sexual activity: Yes  Other Topics Concern  . Not on file  Social History Narrative   divorced 2011, 5 children    household is pt, 3 of the children and wife (remarried)   Social Determinants of Corporate investment banker Strain:   . Difficulty of Paying Living Expenses:   Food Insecurity:   . Worried About Programme researcher, broadcasting/film/video in the Last Year:   . Barista in the Last Year:   Transportation Needs:   . Freight forwarder (Medical):   Marland Kitchen Lack of Transportation (Non-Medical):   Physical Activity:   . Days of Exercise per Week:   . Minutes of Exercise per Session:   Stress:   . Feeling of Stress :   Social Connections:   . Frequency of Communication with Friends and Family:   . Frequency of Social Gatherings with Friends and Family:    . Attends Religious Services:   . Active Member of Clubs or Organizations:   . Attends Banker Meetings:   Marland Kitchen Marital Status:   Intimate Partner Violence:   . Fear of Current or Ex-Partner:   . Emotionally Abused:   Marland Kitchen Physically Abused:   . Sexually Abused:     Past Surgical History:  Procedure Laterality Date  . biceps surgery Right 07-04-15   tendon reattachment  . CARPAL TUNNEL RELEASE Bilateral 06/11/2017  . HEMORRHOID SURGERY  07-2008   thrombosed  . VASECTOMY      Family History  Problem Relation Age of Onset  . Hypertension Father   . Stroke Other        GF  . Aneurysm Other        GM, brain  . CAD Maternal Grandmother   . Colon cancer Neg Hx   . Prostate cancer Neg Hx     Allergies  Allergen Reactions  . Hydrochlorothiazide Anaphylaxis  . Sulfonamide Derivatives Anaphylaxis    Current Outpatient Medications on File Prior to Visit  Medication Sig Dispense Refill  . amLODipine (NORVASC) 10 MG tablet Take 1 tablet (10 mg total) by mouth daily. 30 tablet 6  .  benazepril (LOTENSIN) 40 MG tablet Take 1 tablet (40 mg total) by mouth daily. 90 tablet 3  . esomeprazole (NEXIUM) 40 MG capsule Take 1 capsule (40 mg total) by mouth daily. 30 capsule 0  . famotidine (PEPCID) 20 MG tablet Take 1 tablet (20 mg total) by mouth 2 (two) times daily before lunch and supper. 180 tablet 3  . sildenafil (REVATIO) 20 MG tablet Take 4 tablets (80 mg total) by mouth daily as needed. 30 tablet 6  . atorvastatin (LIPITOR) 20 MG tablet Take 1 tablet (20 mg total) by mouth daily. 90 tablet 3  . metoprolol succinate (TOPROL-XL) 50 MG 24 hr tablet Take 1 tablet (50 mg total) by mouth daily. Take with or immediately following a meal. 90 tablet 3   No current facility-administered medications on file prior to visit.    BP (!) 133/94 (BP Location: Right Arm, Patient Position: Sitting, Cuff Size: Large)   Pulse 80   Temp 98.6 F (37 C) (Temporal)   Resp 16   Ht 5\' 10"  (1.778  m)   Wt 274 lb (124.3 kg)   SpO2 98%   BMI 39.31 kg/m       Objective:   Physical Exam Constitutional:      General: He is not in acute distress.    Appearance: He is well-developed.  HENT:     Head: Normocephalic and atraumatic.  Musculoskeletal:     Comments: Left achilles tendon- tender to palpation. No swelling.  + pain with dorsiflexion of left foot  Skin:    General: Skin is warm and dry.  Neurological:     Mental Status: He is alert and oriented to person, place, and time.  Psychiatric:        Behavior: Behavior normal.        Thought Content: Thought content normal.           Assessment & Plan:  Injury of left achilles tendon- will refer to sports medicine for further evaluation.   HTN- DBP noted to be mildly elevated today. Advised pt to schedule appointment with PCP for blood pressure follow up.   This visit occurred during the SARS-CoV-2 public health emergency.  Safety protocols were in place, including screening questions prior to the visit, additional usage of staff PPE, and extensive cleaning of exam room while observing appropriate contact time as indicated for disinfecting solutions.

## 2019-12-10 NOTE — Patient Instructions (Signed)
You should be contacted about scheduling your appointment with Dr.  Jordan Likes, sports medicine.

## 2019-12-15 ENCOUNTER — Ambulatory Visit: Payer: Self-pay

## 2019-12-15 ENCOUNTER — Other Ambulatory Visit: Payer: Self-pay

## 2019-12-15 ENCOUNTER — Encounter: Payer: Self-pay | Admitting: Family Medicine

## 2019-12-15 ENCOUNTER — Ambulatory Visit (INDEPENDENT_AMBULATORY_CARE_PROVIDER_SITE_OTHER): Payer: Managed Care, Other (non HMO) | Admitting: Family Medicine

## 2019-12-15 VITALS — BP 133/90 | HR 92 | Ht 70.0 in | Wt 270.0 lb

## 2019-12-15 DIAGNOSIS — M6788 Other specified disorders of synovium and tendon, other site: Secondary | ICD-10-CM | POA: Diagnosis not present

## 2019-12-15 DIAGNOSIS — M7660 Achilles tendinitis, unspecified leg: Secondary | ICD-10-CM

## 2019-12-15 MED ORDER — PREDNISONE 5 MG PO TABS: ORAL_TABLET | ORAL | 0 refills | Status: DC

## 2019-12-15 MED ORDER — IBUPROFEN-FAMOTIDINE 800-26.6 MG PO TABS: 1.0000 | ORAL_TABLET | Freq: Three times a day (TID) | ORAL | 3 refills | Status: DC

## 2019-12-15 MED ORDER — NITROGLYCERIN 0.2 MG/HR TD PT24: MEDICATED_PATCH | TRANSDERMAL | 11 refills | Status: DC

## 2019-12-15 NOTE — Patient Instructions (Addendum)
Nice to meet you Please try to elevate your foot  Please try ice  Try the heel lifts  Please try the exercises  Use the duexis after the prednisone and as needed   Please send me a message in MyChart with any questions or updates.  Please see me back in 4 weeks.   --Dr. Jordan Likes  Nitroglycerin Protocol   Apply 1/4 nitroglycerin patch to affected area daily.  Change position of patch within the affected area every 24 hours.  You may experience a headache during the first 1-2 weeks of using the patch, these should subside.  If you experience headaches after beginning nitroglycerin patch treatment, you may take your preferred over the counter pain reliever.  Another side effect of the nitroglycerin patch is skin irritation or rash related to patch adhesive.  Please notify our office if you develop more severe headaches or rash, and stop the patch.  Tendon healing with nitroglycerin patch may require 12 to 24 weeks depending on the extent of injury.  Men should not use if taking Viagra, Cialis, or Levitra.   Do not use if you have migraines or rosacea.

## 2019-12-15 NOTE — Assessment & Plan Note (Signed)
Significant thickening, neovascularization bursitis and calcaneal heel spur. -Heel lifts. -Nitro.  Counseled on its use and if taking sildenafil. -Duexis. -Prednisone. -Counseled on home exercise therapy and supportive care. -Could consider retrocalcaneal bursa injection versus physical therapy.

## 2019-12-15 NOTE — Progress Notes (Signed)
Garrett Bell - 44 y.o. male MRN [id number]  Date of birth: 1975/11/17  SUBJECTIVE:  Including CC & ROS.  Chief Complaint  Patient presents with  . Tendonitis    left    Garrett Bell is a 44 y.o. male that is presenting with left Achilles pain.  The pain is acute on chronic in nature.  He ordered a few years ago when he was jumping off the lawnmower.  The pain resolved after a few days.  Is been intermittent since that time.  It is worse at the end of the day.  Is worse with walking.  No history of surgery.  Gets improvement with ibuprofen.   Review of Systems See HPI   HISTORY: Past Medical, Surgical, Social, and Family History Reviewed & Updated per EMR.   Pertinent Historical Findings include:  Past Medical History:  Diagnosis Date  . Chest pain    neg stress test 01/2009  . Diabetes ([place])    A1C 6.4 (2012)  . GERD (gastroesophageal reflux disease)   . Headache(784.0)   . HTN (hypertension)   . Traumatic partial tear of right biceps tendon 06/2015    Past Surgical History:  Procedure Laterality Date  . biceps surgery Right 07-04-15   tendon reattachment  . CARPAL TUNNEL RELEASE Bilateral 06/11/2017  . HEMORRHOID SURGERY  07-2008   thrombosed  . VASECTOMY      Family History  Problem Relation Age of Onset  . Hypertension Father   . Stroke Other        GF  . Aneurysm Other        GM, brain  . CAD Maternal Grandmother   . Colon cancer Neg Hx   . Prostate cancer Neg Hx     Social History   Socioeconomic History  . Marital status: Married    Spouse name: Not on file  . Number of children: 5  . Years of education: Not on file  . Highest education level: Not on file  Occupational History  . Occupation: Location manager   Tobacco Use  . Smoking status: Never Smoker  . Smokeless tobacco: Never Used  Substance and Sexual Activity  . Alcohol use: No  . Drug use: No  . Sexual activity: Yes  Other Topics Concern  . Not on file  Social History  Narrative   divorced 2011, 5 children    household is pt, 3 of the children and wife (remarried)   Social Determinants of Radio broadcast assistant Strain:   . Difficulty of Paying Living Expenses:   Food Insecurity:   . Worried About Charity fundraiser in the Last Year:   . Arboriculturist in the Last Year:   Transportation Needs:   . Film/video editor (Medical):   Marland Kitchen Lack of Transportation (Non-Medical):   Physical Activity:   . Days of Exercise per Week:   . Minutes of Exercise per Session:   Stress:   . Feeling of Stress :   Social Connections:   . Frequency of Communication with Friends and Family:   . Frequency of Social Gatherings with Friends and Family:   . Attends Religious Services:   . Active Member of Clubs or Organizations:   . Attends Archivist Meetings:   Marland Kitchen Marital Status:   Intimate Partner Violence:   . Fear of Current or Ex-Partner:   . Emotionally Abused:   Marland Kitchen Physically Abused:   . Sexually Abused:  PHYSICAL EXAM:  VS: BP 133/90   Pulse 92   Ht 5\' 10"  (1.778 m)   Wt 270 lb (122.5 kg)   BMI 38.74 kg/m  Physical Exam Gen: NAD, alert, cooperative with exam, well-appearing MSK:  Left ankle: Tenderness to palpation at the mid belly of the Achilles. Normal strength resistance. Plantarflexion with test. No Haglund's deformity. Neurovascularly intact  Limited ultrasound: Left Achilles:  Calcific changes at the insertion as well as a calcaneal heel spur. Retrocalcaneal bursitis apparent. Thickening and increased hyperemia to suggest tendinopathy.  Summary: Achilles tendinopathy  Ultrasound and interpretation by Janee Morn, MD    ASSESSMENT & PLAN:   Achilles tendinosis Significant thickening, neovascularization bursitis and calcaneal heel spur. -Heel lifts. -Nitro.  Counseled on its use and if taking sildenafil. -Duexis. -Prednisone. -Counseled on home exercise therapy and supportive care. -Could  consider retrocalcaneal bursa injection versus physical therapy.

## 2020-01-14 ENCOUNTER — Ambulatory Visit: Payer: Managed Care, Other (non HMO) | Admitting: Family Medicine

## 2020-01-27 ENCOUNTER — Encounter: Payer: Self-pay | Admitting: Family Medicine

## 2020-01-27 ENCOUNTER — Other Ambulatory Visit: Payer: Self-pay

## 2020-01-27 ENCOUNTER — Ambulatory Visit (INDEPENDENT_AMBULATORY_CARE_PROVIDER_SITE_OTHER): Payer: Managed Care, Other (non HMO) | Admitting: Family Medicine

## 2020-01-27 DIAGNOSIS — M6788 Other specified disorders of synovium and tendon, other site: Secondary | ICD-10-CM

## 2020-01-27 NOTE — Progress Notes (Signed)
Garrett Bell - 44 y.o. male MRN [id number]  Date of birth: 02-12-1976  SUBJECTIVE:  Including CC & ROS.  Chief Complaint  Patient presents with  . Follow-up    left achilles    Garrett Bell is a 44 y.o. male that is following up for his Achilles pain.  He denies any pain but he still notices it every so often.  It does swell at the end of the day.  He has been using the nitroglycerin patches.   Review of Systems See HPI   HISTORY: Past Medical, Surgical, Social, and Family History Reviewed & Updated per EMR.   Pertinent Historical Findings include:  Past Medical History:  Diagnosis Date  . Chest pain    neg stress test 01/2009  . Diabetes ([facility])    A1C 6.4 (2012)  . GERD (gastroesophageal reflux disease)   . Headache(784.0)   . HTN (hypertension)   . Traumatic partial tear of right biceps tendon 06/2015    Past Surgical History:  Procedure Laterality Date  . biceps surgery Right 07-04-15   tendon reattachment  . CARPAL TUNNEL RELEASE Bilateral 06/11/2017  . HEMORRHOID SURGERY  07-2008   thrombosed  . VASECTOMY      Family History  Problem Relation Age of Onset  . Hypertension Father   . Stroke Other        GF  . Aneurysm Other        GM, brain  . CAD Maternal Grandmother   . Colon cancer Neg Hx   . Prostate cancer Neg Hx     Social History   Socioeconomic History  . Marital status: Married    Spouse name: Not on file  . Number of children: 5  . Years of education: Not on file  . Highest education level: Not on file  Occupational History  . Occupation: Conservator, museum/gallery   Tobacco Use  . Smoking status: Never Smoker  . Smokeless tobacco: Never Used  Substance and Sexual Activity  . Alcohol use: No  . Drug use: No  . Sexual activity: Yes  Other Topics Concern  . Not on file  Social History Narrative   divorced 2011, 5 children    household is pt, 3 of the children and wife (remarried)   Social Determinants of Corporate investment banker  Strain:   . Difficulty of Paying Living Expenses:   Food Insecurity:   . Worried About Programme researcher, broadcasting/film/video in the Last Year:   . Barista in the Last Year:   Transportation Needs:   . Freight forwarder (Medical):   Marland Kitchen Lack of Transportation (Non-Medical):   Physical Activity:   . Days of Exercise per Week:   . Minutes of Exercise per Session:   Stress:   . Feeling of Stress :   Social Connections:   . Frequency of Communication with Friends and Family:   . Frequency of Social Gatherings with Friends and Family:   . Attends Religious Services:   . Active Member of Clubs or Organizations:   . Attends Banker Meetings:   Marland Kitchen Marital Status:   Intimate Partner Violence:   . Fear of Current or Ex-Partner:   . Emotionally Abused:   Marland Kitchen Physically Abused:   . Sexually Abused:      PHYSICAL EXAM:  VS: BP (!) 149/98   Pulse 73   Ht 5\' 10"  (1.778 m)   Wt 270 lb (122.5 kg)  BMI 38.74 kg/m  Physical Exam Gen: NAD, alert, cooperative with exam, well-appearing MSK:  Left Achilles: No redness. Ongoing swelling. Normal range of motion. Normal strength resistance. Neurovascular intact   ASSESSMENT & PLAN:   Achilles tendinosis Pain has improved but still notices discomfort from time to time.  Has been dealing with this for several months.  Seems to be on track.  -Counseled on home exercise therapy and supportive care. -Provided different heel lifts. -Continue nitro patches. -Follow-up in 6 to 8 weeks.  If no improvement could consider a retrocalcaneal bursa injection or PT.

## 2020-01-27 NOTE — Assessment & Plan Note (Signed)
Pain has improved but still notices discomfort from time to time.  Has been dealing with this for several months.  Seems to be on track.  -Counseled on home exercise therapy and supportive care. -Provided different heel lifts. -Continue nitro patches. -Follow-up in 6 to 8 weeks.  If no improvement could consider a retrocalcaneal bursa injection or PT.

## 2020-01-27 NOTE — Patient Instructions (Signed)
Good to see you Please continue the exercises  Please try the compression  Please try the heel lifts   Please send me a message in MyChart with any questions or updates.  Please see me back in 6-8 weeks.   --Dr. Jordan Likes

## 2020-03-09 ENCOUNTER — Ambulatory Visit (INDEPENDENT_AMBULATORY_CARE_PROVIDER_SITE_OTHER): Payer: Managed Care, Other (non HMO) | Admitting: Family Medicine

## 2020-03-09 ENCOUNTER — Other Ambulatory Visit: Payer: Self-pay | Admitting: *Deleted

## 2020-03-09 ENCOUNTER — Other Ambulatory Visit: Payer: Self-pay

## 2020-03-09 DIAGNOSIS — M6788 Other specified disorders of synovium and tendon, other site: Secondary | ICD-10-CM

## 2020-03-09 MED ORDER — AMLODIPINE BESYLATE 10 MG PO TABS: 10.0000 mg | ORAL_TABLET | Freq: Every day | ORAL | 0 refills | Status: DC

## 2020-03-09 NOTE — Patient Instructions (Signed)
Good to see you Please try the pennsaid  Please try the compression  Please continue the strengthening exercises   Please send me a message in MyChart with any questions or updates.  Please see me back in 2-3 months.   --Dr. Jordan Likes

## 2020-03-09 NOTE — Assessment & Plan Note (Signed)
Pain has improved about 30%.  Gets discomfort when he is active and at the end of the day. -Continue home exercises. -Provided samples of Pennsaid. -Continue nitro patches. -Could consider physical therapy or debridement if he plateaus in his rehab.

## 2020-03-09 NOTE — Progress Notes (Signed)
Garrett Bell - 44 y.o. male MRN [id number]  Date of birth: 07-Sep-1975  SUBJECTIVE:  Including CC & ROS.  No chief complaint on file.   Garrett Bell is a 44 y.o. male that is presenting with ongoing Achilles tendinosis.  He has been improving slightly.  He still gets pain if he is active through the course of the day.  No debilitating pain however.  Does get swelling intermittently.  Still continues to use nitro patches.  Has been doing the home exercises.   Review of Systems See HPI   HISTORY: Past Medical, Surgical, Social, and Family History Reviewed & Updated per EMR.   Pertinent Historical Findings include:  Past Medical History:  Diagnosis Date  . Chest pain    neg stress test 01/2009  . Diabetes ([facility])    A1C 6.4 (2012)  . GERD (gastroesophageal reflux disease)   . Headache(784.0)   . HTN (hypertension)   . Traumatic partial tear of right biceps tendon 06/2015    Past Surgical History:  Procedure Laterality Date  . biceps surgery Right 07-04-15   tendon reattachment  . CARPAL TUNNEL RELEASE Bilateral 06/11/2017  . HEMORRHOID SURGERY  07-2008   thrombosed  . VASECTOMY      Family History  Problem Relation Age of Onset  . Hypertension Father   . Stroke Other        GF  . Aneurysm Other        GM, brain  . CAD Maternal Grandmother   . Colon cancer Neg Hx   . Prostate cancer Neg Hx     Social History   Socioeconomic History  . Marital status: Married    Spouse name: Not on file  . Number of children: 5  . Years of education: Not on file  . Highest education level: Not on file  Occupational History  . Occupation: Conservator, museum/gallery   Tobacco Use  . Smoking status: Never Smoker  . Smokeless tobacco: Never Used  Substance and Sexual Activity  . Alcohol use: No  . Drug use: No  . Sexual activity: Yes  Other Topics Concern  . Not on file  Social History Narrative   divorced 2011, 5 children    household is pt, 3 of the children and wife  (remarried)   Social Determinants of Corporate investment banker Strain:   . Difficulty of Paying Living Expenses:   Food Insecurity:   . Worried About Programme researcher, broadcasting/film/video in the Last Year:   . Barista in the Last Year:   Transportation Needs:   . Freight forwarder (Medical):   Marland Kitchen Lack of Transportation (Non-Medical):   Physical Activity:   . Days of Exercise per Week:   . Minutes of Exercise per Session:   Stress:   . Feeling of Stress :   Social Connections:   . Frequency of Communication with Friends and Family:   . Frequency of Social Gatherings with Friends and Family:   . Attends Religious Services:   . Active Member of Clubs or Organizations:   . Attends Banker Meetings:   Marland Kitchen Marital Status:   Intimate Partner Violence:   . Fear of Current or Ex-Partner:   . Emotionally Abused:   Marland Kitchen Physically Abused:   . Sexually Abused:      PHYSICAL EXAM:  VS: BP (!) 140/98   Ht 5\' 10"  (1.778 m)   Wt 270 lb (122.5 kg)   BMI  38.74 kg/m  Physical Exam Gen: NAD, alert, cooperative with exam, well-appearing MSK:  Left Achilles: No redness or swelling. Normal strength resistance. No tenderness to palpation. Neurovascular intact   ASSESSMENT & PLAN:   Achilles tendinosis Pain has improved about 30%.  Gets discomfort when he is active and at the end of the day. -Continue home exercises. -Provided samples of Pennsaid. -Continue nitro patches. -Could consider physical therapy or debridement if he plateaus in his rehab.

## 2020-03-09 NOTE — Progress Notes (Signed)
Medication Samples have been provided to the patient.  Drug name: pensaid       Strength: 2%        Qty: 2 boxes  LOT: [id number]  Exp.Date: 2/22  Dosing instructions: use a pea size amount and rub gently  The patient has been instructed regarding the correct time, dose, and frequency of taking this medication, including desired effects and most common side effects.   Lillia Pauls 8:49 AM 03/09/2020

## 2020-03-13 ENCOUNTER — Other Ambulatory Visit: Payer: Self-pay | Admitting: Internal Medicine

## 2020-03-13 ENCOUNTER — Other Ambulatory Visit: Payer: Self-pay

## 2020-03-13 MED ORDER — AMLODIPINE BESYLATE 10 MG PO TABS: 10.0000 mg | ORAL_TABLET | Freq: Every day | ORAL | 0 refills | Status: DC

## 2020-03-29 ENCOUNTER — Other Ambulatory Visit: Payer: Self-pay

## 2020-03-29 MED ORDER — AMLODIPINE BESYLATE 10 MG PO TABS: 10.0000 mg | ORAL_TABLET | Freq: Every day | ORAL | 0 refills | Status: DC

## 2020-04-30 ENCOUNTER — Encounter (HOSPITAL_BASED_OUTPATIENT_CLINIC_OR_DEPARTMENT_OTHER): Payer: Self-pay | Admitting: *Deleted

## 2020-04-30 ENCOUNTER — Other Ambulatory Visit: Payer: Self-pay

## 2020-04-30 ENCOUNTER — Emergency Department (HOSPITAL_BASED_OUTPATIENT_CLINIC_OR_DEPARTMENT_OTHER)
Admission: EM | Admit: 2020-04-30 | Discharge: 2020-04-30 | Disposition: A | Discharge status: Home / Self Care | Payer: Managed Care, Other (non HMO) | Attending: Emergency Medicine | Admitting: Emergency Medicine

## 2020-04-30 DIAGNOSIS — M7989 Other specified soft tissue disorders: Secondary | ICD-10-CM | POA: Diagnosis not present

## 2020-04-30 DIAGNOSIS — Z5321 Procedure and treatment not carried out due to patient leaving prior to being seen by health care provider: Secondary | ICD-10-CM | POA: Insufficient documentation

## 2020-04-30 NOTE — ED Triage Notes (Signed)
Pt reports he returned yesterday from cruise to [place] and [place]. Neg covid test on ship. Reports while on cruise he had a small bump on his right shin thursday that now has turned into redness and swelling on his lower leg. He received IV clinda on the ship and was started on dicloxacillin. Reports since returning home swelling and redness have increased. He drove home from [facility] yesterday (reports stopping hourly to walk around)

## 2020-05-01 ENCOUNTER — Ambulatory Visit (INDEPENDENT_AMBULATORY_CARE_PROVIDER_SITE_OTHER): Payer: Managed Care, Other (non HMO) | Admitting: Internal Medicine

## 2020-05-01 ENCOUNTER — Encounter: Payer: Self-pay | Admitting: Internal Medicine

## 2020-05-01 VITALS — BP 160/124 | HR 81 | Temp 98.7°F | Resp 18 | Ht 70.0 in | Wt 276.4 lb

## 2020-05-01 DIAGNOSIS — I10 Essential (primary) hypertension: Secondary | ICD-10-CM | POA: Diagnosis not present

## 2020-05-01 DIAGNOSIS — L03115 Cellulitis of right lower limb: Secondary | ICD-10-CM

## 2020-05-01 MED ORDER — DOXYCYCLINE HYCLATE 100 MG PO TABS: 100.0000 mg | ORAL_TABLET | Freq: Two times a day (BID) | ORAL | 0 refills | Status: DC

## 2020-05-01 NOTE — Patient Instructions (Signed)
Stop antibiotic they gave you at the ship Start doxycycline 1 tablet   twice a day for 10 days  Leg elevation  Do not go back to work until Friday  Temporarily increase metoprolol to 2 tablets daily and check your blood pressure daily. BP GOAL is between 110/65 and  135/85. If it is consistently higher or lower, let me know   Call immediately if high fever, increased swelling, severe leg pain, chest pain, difficulty breathing.  Stop ibuprofen, for pain take Tylenol   GO TO THE FRONT DESK, PLEASE SCHEDULE YOUR APPOINTMENTS Come back for   A check up in 10 days        STOP BY THE FIRST FLOOR:  Schedule a ultrasound

## 2020-05-01 NOTE — Progress Notes (Signed)
Subjective:    Patient ID: Garrett Bell, male    DOB: 1975/12/27, 44 y.o.   MRN: [id number]  DOS:  05/01/2020 Type of visit - description: Acute The patient was on a cruise and on September 30 he felt unwell and had fever.  Went to see the onboard doctor.  Note reviewed: Temperature 98.4, BP 165/111.  O2 sat 97%.  Had redness of the right lower extremity tracking to the back of the right thigh. Diagnosed with cellulitis.  He received clindamycin IV x1, dicloxacillin, ketorolac. Has been taking ibuprofen multiple times since then.  Currently, the swelling and redness at the proximal area are better but he has noticed some redness/swelling around the right ankle. Last time he had fever was 2 days ago (taking ibuprofen) but overall he feels better.  He drove to [place] to take the cruise and drove back 2 days ago. Denies chest pain or difficulty breathing.  No palpitations.   Review of Systems See above   Past Medical History:  Diagnosis Date  . Chest pain    neg stress test 01/2009  . Diabetes ([facility])    A1C 6.4 (2012)  . GERD (gastroesophageal reflux disease)   . Headache(784.0)   . HTN (hypertension)   . Traumatic partial tear of right biceps tendon 06/2015    Past Surgical History:  Procedure Laterality Date  . biceps surgery Right 07-04-15   tendon reattachment  . CARPAL TUNNEL RELEASE Bilateral 06/11/2017  . HEMORRHOID SURGERY  07-2008   thrombosed  . VASECTOMY      Allergies as of 05/01/2020      Reactions   Hydrochlorothiazide Anaphylaxis   Sulfonamide Derivatives Anaphylaxis      Medication List       Accurate as of May 01, 2020 11:35 AM. If you have any questions, ask your nurse or doctor.        amLODipine 10 MG tablet Commonly known as: NORVASC Take 1 tablet (10 mg total) by mouth daily.   atorvastatin 20 MG tablet Commonly known as: LIPITOR Take 1 tablet (20 mg total) by mouth daily.   benazepril 40 MG tablet Commonly known as: LOTENSIN Take  1 tablet (40 mg total) by mouth daily.   dicloxacillin 250 MG capsule Commonly known as: DYNAPEN Take 500 mg by mouth 4 (four) times daily.   esomeprazole 40 MG capsule Commonly known as: NEXIUM Take 1 capsule (40 mg total) by mouth daily.   famotidine 20 MG tablet Commonly known as: PEPCID Take 1 tablet (20 mg total) by mouth 2 (two) times daily before lunch and supper.   Ibuprofen-Famotidine 800-26.6 MG Tabs Take 1 tablet by mouth 3 (three) times daily.   metoprolol succinate 50 MG 24 hr tablet Commonly known as: TOPROL-XL Take 1 tablet (50 mg total) by mouth daily. Take with or immediately following a meal.   nitroGLYCERIN 0.2 mg/hr patch Commonly known as: NITRODUR - Dosed in mg/24 hr Cut and apply 1/4 patch to most painful area q24h.   predniSONE 5 MG tablet Commonly known as: DELTASONE Take 6 pills for first day, 5 pills second day, 4 pills third day, 3 pills fourth day, 2 pills the fifth day, and 1 pill sixth day.   sildenafil 20 MG tablet Commonly known as: REVATIO Take 4 tablets (80 mg total) by mouth daily as needed.          Objective:   Physical Exam BP (!) 160/124 (BP Location: Left Arm, Patient Position: Sitting, Cuff Size:  Normal)   Pulse 81   Temp 98.7 F (37.1 C) (Oral)   Resp 18   Ht 5\' 10"  (1.778 m)   Wt 276 lb 6 oz (125.4 kg)   SpO2 95%   BMI 39.66 kg/m    General: In no distress, nontoxic.  Lower extremities: Obvious swelling and redness at the right leg, see pictures.  Calf is measured: 2 inches larger in circumference compared to the left. Area of cellulitis is a slightly TTP. He has good right pedal pulse.  Skin: Not pale. Not jaundice Neurologic:  alert & oriented X3.  Speech normal, gait appropriate for age and unassisted Psych--  Cognition and judgment appear intact.  Cooperative with normal attention span and concentration.  Behavior appropriate. No anxious or depressed appearing.          Assessment      Assessment Prediabetes dx- 2012, A1c 6.4 HTN (HCTZ and sulfa) : anaphylaxis,  GERD Morbid obesity: BMI 39+ prediabetes, HTN DJD: Hands ED H/o headaches H/o  stress test 2010 (-)  PLAN Cellulitis, right leg: Symptoms started 04/27/2020: He was going on cruise at the 04/29/2020, he thinks he had a small cut at the right pretibial area before the cellulitis started, has been exposed to salt water. Saw the doctor on board, received IV clindamycin x1 and was prescribed penicillin. Fever stopped 2 days ago noting that he is taking ibuprofen, overall redness proximally better but distally slightly worse. Plan: Change antibiotic to doxycycline given salt water exposure Leg elevation, stop ibuprofen, start Tylenol, call if not better. Work note provided Ultrasound today, rule out DVT right leg. HTN: BP today is elevated, heart rate 81.  Has been high in the last few days. Plan: Stop ibuprofen, continue amlodipine, Lotensin, increase metoprolol XL to 100 mg daily at least temporarily.  See instructions RTC 10 days  This visit occurred during the SARS-CoV-2 public health emergency.  Safety protocols were in place, including screening questions prior to the visit, additional usage of staff PPE, and extensive cleaning of exam room while observing appropriate contact time as indicated for disinfecting solutions.

## 2020-05-01 NOTE — Progress Notes (Signed)
Pre visit review using our clinic review tool, if applicable. No additional management support is needed unless otherwise documented below in the visit note. 

## 2020-05-02 NOTE — Assessment & Plan Note (Signed)
Cellulitis, right leg: Symptoms started 04/27/2020: He was going on cruise at the [place], he thinks he had a small cut at the right pretibial area before the cellulitis started, has been exposed to salt water. Saw the doctor on board, received IV clindamycin x1 and was prescribed penicillin. Fever stopped 2 days ago noting that he is taking ibuprofen, overall redness proximally better but distally slightly worse. Plan: Change antibiotic to doxycycline given salt water exposure Leg elevation, stop ibuprofen, start Tylenol, call if not better. Work note provided Ultrasound today, rule out DVT right leg. HTN: BP today is elevated, heart rate 81.  Has been high in the last few days. Plan: Stop ibuprofen, continue amlodipine, Lotensin, increase metoprolol XL to 100 mg daily at least temporarily.  See instructions RTC 10 days

## 2020-05-04 ENCOUNTER — Other Ambulatory Visit: Payer: Self-pay

## 2020-05-04 ENCOUNTER — Telehealth: Payer: Self-pay | Admitting: Internal Medicine

## 2020-05-04 ENCOUNTER — Ambulatory Visit (HOSPITAL_BASED_OUTPATIENT_CLINIC_OR_DEPARTMENT_OTHER)
Admission: RE | Admit: 2020-05-04 | Discharge: 2020-05-04 | Disposition: A | Discharge status: Home / Self Care | Payer: Managed Care, Other (non HMO) | Source: Ambulatory Visit | Attending: Internal Medicine | Admitting: Internal Medicine

## 2020-05-04 DIAGNOSIS — L03115 Cellulitis of right lower limb: Secondary | ICD-10-CM | POA: Diagnosis not present

## 2020-05-04 NOTE — Telephone Encounter (Signed)
Agree, just ask him what day of next week he likes to go back.

## 2020-05-04 NOTE — Telephone Encounter (Signed)
Pt is requesting an extension on his work note, doesn't think he will be able to return to work tomorrow. Please advise.

## 2020-05-04 NOTE — Telephone Encounter (Signed)
Please advise. [place] neg for DVT.

## 2020-05-04 NOTE — Telephone Encounter (Signed)
Spoke w/ Pt- he informed he was on feet for about 45 mins yesterday and leg swelled "size of canteloupe", informed to keep legs elevated, finish abx. Scheduled sooner appt for Monday 10/11 at 11:20am but instructed to go to ED or UC if fever, chills, not improving or getting worse over weekend. We agreed to extend work note until next Friday 10/15 for now.

## 2020-05-04 NOTE — Telephone Encounter (Signed)
I spoke with the patient, he confirms that the leg was more swollen yesterday but today is better. The redness at the calf is somewhat decreased but the redness and swelling around the ankle is not. He is not running fever (but is taking Tylenol regularly). Denies blisters I reinforced the need to go to the ER if this is not gradually better, definitely if he get worse.  May need further imaging and IV antibiotics. He verbalized understanding agrees with the plan.

## 2020-05-04 NOTE — Telephone Encounter (Signed)
Result was released

## 2020-05-04 NOTE — Telephone Encounter (Signed)
Caller : Jaymes Call Back # [phone]  Patient states his cellulitis has moved to his ankle, patient is scheduled to return to work in the morning. Per patient is does not think he will be able to work tomorrow due to swelling.  Please Advise

## 2020-05-08 ENCOUNTER — Encounter: Payer: Self-pay | Admitting: Internal Medicine

## 2020-05-08 ENCOUNTER — Other Ambulatory Visit: Payer: Self-pay

## 2020-05-08 ENCOUNTER — Ambulatory Visit (INDEPENDENT_AMBULATORY_CARE_PROVIDER_SITE_OTHER): Payer: Managed Care, Other (non HMO) | Admitting: Internal Medicine

## 2020-05-08 VITALS — BP 160/110 | HR 66 | Temp 97.5°F | Resp 16 | Ht 70.0 in | Wt 281.0 lb

## 2020-05-08 DIAGNOSIS — I10 Essential (primary) hypertension: Secondary | ICD-10-CM | POA: Diagnosis not present

## 2020-05-08 DIAGNOSIS — L03115 Cellulitis of right lower limb: Secondary | ICD-10-CM

## 2020-05-08 MED ORDER — DOXYCYCLINE HYCLATE 100 MG PO TABS
100.0000 mg | ORAL_TABLET | Freq: Two times a day (BID) | ORAL | 0 refills | Status: DC
Start: 2020-05-08 — End: 2020-05-18

## 2020-05-08 MED ORDER — METOPROLOL SUCCINATE ER 100 MG PO TB24: 100.0000 mg | ORAL_TABLET | Freq: Every day | ORAL | 1 refills | Status: DC

## 2020-05-08 NOTE — Progress Notes (Signed)
Pre visit review using our clinic review tool, if applicable. No additional management support is needed unless otherwise documented below in the visit note. 

## 2020-05-08 NOTE — Patient Instructions (Addendum)
Check the  blood pressure 2 or 3 times a month  BP GOAL is between 110/65 and  135/85. If it is consistently higher or lower, let me know  Will stay permanently on metoprolol XL 100 mg daily, new prescription was sent.  Other medications the same.   GO TO THE FRONT DESK, PLEASE SCHEDULE YOUR APPOINTMENTS Come back for   a physical exam, fasting, in 2 months

## 2020-05-08 NOTE — Progress Notes (Signed)
Subjective:    Patient ID: Garrett Bell, male    DOB: November 25, 1975, 44 y.o.   MRN: [id number]  DOS:  05/08/2020 Type of visit - description: Follow-up Follow-up on cellulitis Denies any fever Overall leg  looks better to him. Swelling and pain decreased but not gone BP noted to be elevated, no ambulatory BPs  BP Readings from Last 3 Encounters:  05/08/20 (!) 174/128  05/01/20 (!) 160/124  04/30/20 (!) 167/101     Review of Systems See above   Past Medical History:  Diagnosis Date  . Chest pain    neg stress test 01/2009  . Diabetes (HCC)    A1C 6.4 (2012)  . GERD (gastroesophageal reflux disease)   . Headache(784.0)   . HTN (hypertension)   . Traumatic partial tear of right biceps tendon 06/2015    Past Surgical History:  Procedure Laterality Date  . biceps surgery Right 07-04-15   tendon reattachment  . CARPAL TUNNEL RELEASE Bilateral 06/11/2017  . HEMORRHOID SURGERY  07-2008   thrombosed  . VASECTOMY      Allergies as of 05/08/2020      Reactions   Hydrochlorothiazide Anaphylaxis   Sulfonamide Derivatives Anaphylaxis      Medication List       Accurate as of May 08, 2020 11:38 AM. If you have any questions, ask your nurse or doctor.        amLODipine 10 MG tablet Commonly known as: NORVASC Take 1 tablet (10 mg total) by mouth daily.   atorvastatin 20 MG tablet Commonly known as: LIPITOR Take 1 tablet (20 mg total) by mouth daily.   benazepril 40 MG tablet Commonly known as: LOTENSIN Take 1 tablet (40 mg total) by mouth daily.   doxycycline 100 MG tablet Commonly known as: VIBRA-TABS Take 1 tablet (100 mg total) by mouth 2 (two) times daily.   esomeprazole 40 MG capsule Commonly known as: NEXIUM Take 1 capsule (40 mg total) by mouth daily.   famotidine 20 MG tablet Commonly known as: PEPCID Take 1 tablet (20 mg total) by mouth 2 (two) times daily before lunch and supper.   metoprolol succinate 50 MG 24 hr tablet Commonly known as:  TOPROL-XL Take 1 tablet (50 mg total) by mouth daily. Take with or immediately following a meal.   nitroGLYCERIN 0.2 mg/hr patch Commonly known as: NITRODUR - Dosed in mg/24 hr Cut and apply 1/4 patch to most painful area q24h.   sildenafil 20 MG tablet Commonly known as: REVATIO Take 4 tablets (80 mg total) by mouth daily as needed.          Objective:   Physical Exam BP (!) 174/128 (BP Location: Left Arm, Patient Position: Sitting, Cuff Size: Normal)   Pulse 66   Temp (!) 97.5 F (36.4 C) (Oral)   Resp 16   Ht 5\' 10"  (1.778 m)   Wt 281 lb (127.5 kg)   SpO2 97%   BMI 40.32 kg/m  General:   Well developed, NAD, BMI noted. HEENT:  Normocephalic . Face symmetric, atraumatic Lower extremities: See pictures, redness decreased particularly at the calf. Swelling still there but diminished.  Right calf is 1 inches larger in circumference.  Skin: Not pale. Not jaundice Neurologic:  alert & oriented X3.  Speech normal, gait appropriate for age and unassisted Psych--  Cognition and judgment appear intact.  Cooperative with normal attention span and concentration.  Behavior appropriate. No anxious or depressed appearing.      Assessment  Assessment Prediabetes dx- 2012, A1c 6.4 HTN (HCTZ and sulfa) : anaphylaxis,  GERD Morbid obesity: BMI 39+ prediabetes, HTN DJD: Hands ED H/o headaches H/o  stress test 2010 (-)  PLAN Cellulitis, right leg: Since the last visit it has improved, less red, much less swollen. Plan: 5 additional days of doxycycline, Rx sent, leg elevation, call if not gradually back to normal. Will change return to work for 05/10/2020 but if swelling resurface he will have to stay home more HTN: Since the last visit, metoprolol was increased to 100 mg daily, he is also on amlodipine, Lotensin.  Unable to take HCTZ or sulfa. BP elevated upon arrival, recheck: 160/110. BP was controlled up until recently plan: Continue higher dose of metoprolol and  the rest of the medications, monitor at home. Reassess in 2 months.   This visit occurred during the SARS-CoV-2 public health emergency.  Safety protocols were in place, including screening questions prior to the visit, additional usage of staff PPE, and extensive cleaning of exam room while observing appropriate contact time as indicated for disinfecting solutions.

## 2020-05-09 NOTE — Assessment & Plan Note (Signed)
Cellulitis, right leg: Since the last visit it has improved, less red, much less swollen. Plan: 5 additional days of doxycycline, Rx sent, leg elevation, call if not gradually back to normal. Will change return to work for 05/10/2020 but if swelling resurface he will have to stay home more HTN: Since the last visit, metoprolol was increased to 100 mg daily, he is also on amlodipine, Lotensin.  Unable to take HCTZ or sulfa. BP elevated upon arrival, recheck: 160/110. BP was controlled up until recently plan: Continue higher dose of metoprolol and the rest of the medications, monitor at home. Reassess in 2 months.

## 2020-05-11 ENCOUNTER — Ambulatory Visit: Payer: Managed Care, Other (non HMO) | Admitting: Family Medicine

## 2020-05-11 NOTE — Progress Notes (Deleted)
Garrett Bell - 44 y.o. male MRN [id number]  Date of birth: 08/09/1975  SUBJECTIVE:  Including CC & ROS.  No chief complaint on file.   Garrett Bell is a 44 y.o. male that is  ***.  ***   Review of Systems See HPI   HISTORY: Past Medical, Surgical, Social, and Family History Reviewed & Updated per EMR.   Pertinent Historical Findings include:  Past Medical History:  Diagnosis Date  . Chest pain    neg stress test 01/2009  . Diabetes (HCC)    A1C 6.4 (2012)  . GERD (gastroesophageal reflux disease)   . Headache(784.0)   . HTN (hypertension)   . Traumatic partial tear of right biceps tendon 06/2015    Past Surgical History:  Procedure Laterality Date  . biceps surgery Right 07-04-15   tendon reattachment  . CARPAL TUNNEL RELEASE Bilateral 06/11/2017  . HEMORRHOID SURGERY  07-2008   thrombosed  . VASECTOMY      Family History  Problem Relation Age of Onset  . Hypertension Father   . Stroke Other        GF  . Aneurysm Other        GM, brain  . CAD Maternal Grandmother   . Colon cancer Neg Hx   . Prostate cancer Neg Hx     Social History   Socioeconomic History  . Marital status: Married    Spouse name: Not on file  . Number of children: 5  . Years of education: Not on file  . Highest education level: Not on file  Occupational History  . Occupation: Conservator, museum/gallery   Tobacco Use  . Smoking status: Never Smoker  . Smokeless tobacco: Never Used  Vaping Use  . Vaping Use: Never used  Substance and Sexual Activity  . Alcohol use: No  . Drug use: No  . Sexual activity: Yes  Other Topics Concern  . Not on file  Social History Narrative   divorced 2011, 5 children    household is pt, 3 of the children and wife (remarried)   Social Determinants of Corporate investment banker Strain:   . Difficulty of Paying Living Expenses: Not on file  Food Insecurity:   . Worried About Programme researcher, broadcasting/film/video in the Last Year: Not on file  . Ran Out of Food  in the Last Year: Not on file  Transportation Needs:   . Lack of Transportation (Medical): Not on file  . Lack of Transportation (Non-Medical): Not on file  Physical Activity:   . Days of Exercise per Week: Not on file  . Minutes of Exercise per Session: Not on file  Stress:   . Feeling of Stress : Not on file  Social Connections:   . Frequency of Communication with Friends and Family: Not on file  . Frequency of Social Gatherings with Friends and Family: Not on file  . Attends Religious Services: Not on file  . Active Member of Clubs or Organizations: Not on file  . Attends Banker Meetings: Not on file  . Marital Status: Not on file  Intimate Partner Violence:   . Fear of Current or Ex-Partner: Not on file  . Emotionally Abused: Not on file  . Physically Abused: Not on file  . Sexually Abused: Not on file     PHYSICAL EXAM:  VS: There were no vitals taken for this visit. Physical Exam Gen: NAD, alert, cooperative with exam, well-appearing MSK:  ***  ASSESSMENT & PLAN:   No problem-specific Assessment & Plan notes found for this encounter.

## 2020-05-15 ENCOUNTER — Ambulatory Visit: Payer: Managed Care, Other (non HMO) | Admitting: Internal Medicine

## 2020-05-15 ENCOUNTER — Telehealth: Payer: Self-pay | Admitting: Internal Medicine

## 2020-05-15 DIAGNOSIS — L03115 Cellulitis of right lower limb: Secondary | ICD-10-CM

## 2020-05-15 NOTE — Telephone Encounter (Signed)
Please advise 

## 2020-05-15 NOTE — Telephone Encounter (Signed)
Caller Zaquan Duffner  Call Back # [phone]  Patient is calling in reference to cellulitis on his leg. Patient is requesting a return call from CMA of Dr Drue Novel, patient states that he has been in and out of here for weeks now and still no better.  Please Advise

## 2020-05-16 NOTE — Telephone Encounter (Signed)
Orders placed. Work note printed, Art therapist.

## 2020-05-16 NOTE — Addendum Note (Signed)
Addended byConrad Glasgow D on: 05/16/2020 09:16 AM   Modules accepted: Orders

## 2020-05-16 NOTE — Telephone Encounter (Signed)
LMOM, asked for a call back 

## 2020-05-16 NOTE — Telephone Encounter (Signed)
Spoke with the patient, swelling is about the same.  No fever chills. He went back to work, after working for few hours on his feet, legs get tender and slightly larger. Plan: -Repeat ultrasound r/o DVT -Do not return to work until 05/26/2020.  Needs a letter today and will do his [employer] paperwork when he brings it. -Office visit with me  next week

## 2020-05-16 NOTE — Telephone Encounter (Signed)
Note placed at front desk for pick up.

## 2020-05-17 ENCOUNTER — Telehealth: Payer: Self-pay | Admitting: Internal Medicine

## 2020-05-17 ENCOUNTER — Other Ambulatory Visit: Payer: Self-pay

## 2020-05-17 ENCOUNTER — Ambulatory Visit (HOSPITAL_BASED_OUTPATIENT_CLINIC_OR_DEPARTMENT_OTHER)
Admission: RE | Admit: 2020-05-17 | Discharge: 2020-05-17 | Disposition: A | Discharge status: Home / Self Care | Payer: Managed Care, Other (non HMO) | Source: Ambulatory Visit | Attending: Internal Medicine | Admitting: Internal Medicine

## 2020-05-17 DIAGNOSIS — L03115 Cellulitis of right lower limb: Secondary | ICD-10-CM | POA: Diagnosis present

## 2020-05-17 NOTE — Telephone Encounter (Signed)
Pt dropped off document for provider to fill out ( FMLA paperwork 4 pages) Pt would like to be called when document ready to pick up at [phone]. Document put at front office tray under providers name.

## 2020-05-17 NOTE — Telephone Encounter (Signed)
Awaiting response from Pt- needing to know what days he is requesting payment, or the days he missed work outside of the planned vacation when he was on the cruise.

## 2020-05-18 ENCOUNTER — Telehealth: Payer: Self-pay | Admitting: Internal Medicine

## 2020-05-18 MED ORDER — CEPHALEXIN 500 MG PO CAPS: 500.0000 mg | ORAL_CAPSULE | Freq: Four times a day (QID) | ORAL | 0 refills | Status: DC

## 2020-05-18 MED ORDER — DOXYCYCLINE HYCLATE 100 MG PO TABS: 100.0000 mg | ORAL_TABLET | Freq: Two times a day (BID) | ORAL | 0 refills | Status: DC

## 2020-05-18 NOTE — Telephone Encounter (Signed)
I remain concerned about the patient cellulitis which is not completely resolved after 2 weeks of antibiotics, case discussed with ID doctor of the day for community questions, he recommended 10 additional days of dual antibiotics: Doxycycline and Keflex. Above discussed with the patient, he is in agreement, Rx sent. Also, paperwork to be completed, he will be  able to go back to work on November 3.

## 2020-05-18 NOTE — Telephone Encounter (Signed)
Pt missing work from 05/16/2020 to 05/30/2020. To return back to work on 05/31/2020. Forms completed, awaiting PCP signature.

## 2020-05-19 DIAGNOSIS — Z0279 Encounter for issue of other medical certificate: Secondary | ICD-10-CM

## 2020-05-19 NOTE — Telephone Encounter (Signed)
Spoke w/ Pt- informed that FMLA forms ready for pick up. I have not seen ST disability form yet. Copy of form sent for scanning.

## 2020-05-29 ENCOUNTER — Telehealth: Payer: Self-pay | Admitting: Internal Medicine

## 2020-05-29 NOTE — Telephone Encounter (Signed)
Please advise 

## 2020-05-29 NOTE — Telephone Encounter (Signed)
Patient states he was taking out  Of work form 10/19-11/3. He now needs a return to work note. Please contact to advise.

## 2020-05-29 NOTE — Telephone Encounter (Signed)
Advise patient: If he is better and feels ready to go back to work okay to provide a release

## 2020-05-29 NOTE — Telephone Encounter (Signed)
Previous paperwork completed dated that he can return to work on 05/31/2020. Work note released to [employer].

## 2020-07-10 ENCOUNTER — Ambulatory Visit (INDEPENDENT_AMBULATORY_CARE_PROVIDER_SITE_OTHER): Payer: Managed Care, Other (non HMO) | Admitting: Internal Medicine

## 2020-07-10 ENCOUNTER — Other Ambulatory Visit: Payer: Self-pay

## 2020-07-10 ENCOUNTER — Encounter: Payer: Self-pay | Admitting: Internal Medicine

## 2020-07-10 VITALS — BP 160/100 | HR 69 | Temp 98.3°F | Ht 70.0 in | Wt 285.0 lb

## 2020-07-10 DIAGNOSIS — Z0189 Encounter for other specified special examinations: Secondary | ICD-10-CM | POA: Diagnosis not present

## 2020-07-10 DIAGNOSIS — Z0000 Encounter for general adult medical examination without abnormal findings: Secondary | ICD-10-CM | POA: Diagnosis not present

## 2020-07-10 DIAGNOSIS — I10 Essential (primary) hypertension: Secondary | ICD-10-CM | POA: Diagnosis not present

## 2020-07-10 MED ORDER — TADALAFIL 2.5 MG PO TABS: 1.0000 | ORAL_TABLET | Freq: Every day | ORAL | 6 refills | Status: DC

## 2020-07-10 MED ORDER — AMLODIPINE BESYLATE 10 MG PO TABS: 10.0000 mg | ORAL_TABLET | Freq: Every day | ORAL | 0 refills | Status: DC

## 2020-07-10 NOTE — Patient Instructions (Addendum)
Your blood your blood pressure is elevated. Please be sure you take 3 medications: Amlodipine Metoprolol Benazepril. Check your blood pressure twice a week. BP GOAL is between 110/65 and  135/85. If it is consistently higher or lower, let me know  We are referring you back to Dr. Renea Ee to try tadalafil daily.  If you decide to try it, do not take sildenafil. You cannot make start feel with nitroglycerin so if you decide to take it you need to stop nitroglycerin.  Please consider see the wellness clinic to help you with weight loss.   GO TO THE FRONT DESK, PLEASE SCHEDULE YOUR APPOINTMENTS Come back for blood work in few days fasting  Come back for a checkup in 3 months.

## 2020-07-10 NOTE — Progress Notes (Signed)
Subjective:    Patient ID: Garrett Bell, male    DOB: 1975/09/20, 44 y.o.   MRN: [id number]  DOS:  07/10/2020 Type of visit - description: CPX Here for CPX  The last visit he was seen with cellulitis, is fully recovered except for some persistent swelling of the right lower extremity. BP is elevated today and has been consistently elevated. Still snores.  BP Readings from Last 3 Encounters:  07/10/20 (!) 160/100  05/08/20 (!) 160/110  05/01/20 (!) 160/124    Review of Systems  Other than above, a 14 point review of systems is negative      Past Medical History:  Diagnosis Date  . Chest pain    neg stress test 01/2009  . Diabetes (HCC)    A1C 6.4 (2012)  . GERD (gastroesophageal reflux disease)   . Headache(784.0)   . HTN (hypertension)   . Traumatic partial tear of right biceps tendon 06/2015    Past Surgical History:  Procedure Laterality Date  . biceps surgery Right 07-04-15   tendon reattachment  . CARPAL TUNNEL RELEASE Bilateral 06/11/2017  . HEMORRHOID SURGERY  07-2008   thrombosed  . VASECTOMY      Allergies as of 07/10/2020      Reactions   Hydrochlorothiazide Anaphylaxis   Sulfonamide Derivatives Anaphylaxis      Medication List       Accurate as of July 10, 2020 11:59 PM. If you have any questions, ask your nurse or doctor.        STOP taking these medications   atorvastatin 20 MG tablet Commonly known as: LIPITOR Stopped by: Willow Ora, MD   cephALEXin 500 MG capsule Commonly known as: KEFLEX Stopped by: Willow Ora, MD   doxycycline 100 MG tablet Commonly known as: VIBRA-TABS Stopped by: Willow Ora, MD   nitroGLYCERIN 0.2 mg/hr patch Commonly known as: NITRODUR - Dosed in mg/24 hr Stopped by: Willow Ora, MD     TAKE these medications   amLODipine 10 MG tablet Commonly known as: NORVASC Take 1 tablet (10 mg total) by mouth daily.   benazepril 40 MG tablet Commonly known as: LOTENSIN Take 1 tablet (40 mg total) by mouth  daily.   esomeprazole 40 MG capsule Commonly known as: NEXIUM Take 1 capsule (40 mg total) by mouth daily.   famotidine 20 MG tablet Commonly known as: PEPCID Take 1 tablet (20 mg total) by mouth 2 (two) times daily before lunch and supper.   metoprolol succinate 100 MG 24 hr tablet Commonly known as: Toprol XL Take 1 tablet (100 mg total) by mouth daily. Take with or immediately following a meal.   sildenafil 20 MG tablet Commonly known as: REVATIO Take 4 tablets (80 mg total) by mouth daily as needed.   Tadalafil 2.5 MG Tabs Take 1 tablet (2.5 mg total) by mouth daily. Started by: Willow Ora, MD          Objective:   Physical Exam BP (!) 160/100 (BP Location: Right Arm, Patient Position: Sitting, Cuff Size: Large)   Pulse 69   Temp 98.3 F (36.8 C) (Oral)   Ht 5\' 10"  (1.778 m)   Wt 285 lb (129.3 kg)   SpO2 95%   BMI 40.89 kg/m  General: Well developed, NAD, BMI noted Neck: No  thyromegaly  HEENT:  Normocephalic . Face symmetric, atraumatic Lungs:  CTA B Normal respiratory effort, no intercostal retractions, no accessory muscle use. Heart: RRR,  no murmur.  Abdomen:  Not distended,  soft, non-tender. No rebound or rigidity.   Lower extremities: No pitting edema but the right calf is larger by 1 inch in circumference compared to the left.  No TTP.  No redness or warmness. Skin: Exposed areas without rash. Not pale. Not jaundice Neurologic:  alert & oriented X3.  Speech normal, gait appropriate for age and unassisted Strength symmetric and appropriate for age.  Psych: Cognition and judgment appear intact.  Cooperative with normal attention span and concentration.  Behavior appropriate. No anxious or depressed appearing.     Assessment    Assessment Prediabetes dx- 2012, A1c 6.4 HTN - (HCTZ and sulfa) : anaphylaxis,  -Saw cardiology 08/10/2019: -Echo: Mild LVH, grade 1 diastolic dysfunction. 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   Only on  benazepril 40 mg, metoprolol XL 100 mg, not taking amlodipine (reason?) Plan: Take  medications as prescribed, refer back to Dr Tresa Endo (HTN management, OSA?) OSA?  Refer back to cardiology, they were planning home sleep study. Cellulitis, right leg: Resolved, still the right calf is larger but no evidence of active infection. ED: Would like to take daily tadalafil, Rx printed.  If cost is an issue continue sildenafil.  At some point had NTG at home but that ran out, nevertheless precautions discussed Morbid obesity: This remains a major challenge, wellness clinic information provided RTC for lab fasting in few days RTC office visit 3 months    This visit occurred during the SARS-CoV-2 public health emergency.  Safety protocols were in place, including screening questions prior to the visit, additional usage of staff PPE, and extensive cleaning of exam room while observing appropriate contact time as indicated for disinfecting solutions.

## 2020-07-11 ENCOUNTER — Encounter: Payer: Self-pay | Admitting: Internal Medicine

## 2020-07-11 ENCOUNTER — Other Ambulatory Visit (INDEPENDENT_AMBULATORY_CARE_PROVIDER_SITE_OTHER): Payer: Managed Care, Other (non HMO)

## 2020-07-11 DIAGNOSIS — Z0000 Encounter for general adult medical examination without abnormal findings: Secondary | ICD-10-CM

## 2020-07-11 LAB — CBC WITH DIFFERENTIAL/PLATELET
Basophils Absolute: 0.1 10*3/uL (ref 0.0–0.1)
Basophils Relative: 1.1 % (ref 0.0–3.0)
Eosinophils Absolute: 0.2 10*3/uL (ref 0.0–0.7)
Eosinophils Relative: 3.4 % (ref 0.0–5.0)
HCT: 46.9 % (ref 39.0–52.0)
Hemoglobin: 15.6 g/dL (ref 13.0–17.0)
Lymphocytes Relative: 34.5 % (ref 12.0–46.0)
Lymphs Abs: 1.7 10*3/uL (ref 0.7–4.0)
MCHC: 33.3 g/dL (ref 30.0–36.0)
MCV: 89.5 fl (ref 78.0–100.0)
Monocytes Absolute: 0.6 10*3/uL (ref 0.1–1.0)
Monocytes Relative: 12.6 % — ABNORMAL HIGH (ref 3.0–12.0)
Neutro Abs: 2.4 10*3/uL (ref 1.4–7.7)
Neutrophils Relative %: 48.4 % (ref 43.0–77.0)
Platelets: 172 10*3/uL (ref 150.0–400.0)
RBC: 5.24 Mil/uL (ref 4.22–5.81)
RDW: 13.6 % (ref 11.5–15.5)
WBC: 5 10*3/uL (ref 4.0–10.5)

## 2020-07-11 LAB — T4, FREE: Free T4: 0.77 ng/dL (ref 0.60–1.60)

## 2020-07-11 LAB — COMPREHENSIVE METABOLIC PANEL
ALT: 21 U/L (ref 0–53)
AST: 16 U/L (ref 0–37)
Albumin: 4.1 g/dL (ref 3.5–5.2)
Alkaline Phosphatase: 50 U/L (ref 39–117)
BUN: 18 mg/dL (ref 6–23)
CO2: 29 mEq/L (ref 19–32)
Calcium: 9 mg/dL (ref 8.4–10.5)
Chloride: 102 mEq/L (ref 96–112)
Creatinine, Ser: 0.91 mg/dL (ref 0.40–1.50)
GFR: 100.91 mL/min (ref >60.00)
Glucose, Bld: 103 mg/dL — ABNORMAL HIGH (ref 70–99)
Potassium: 4.4 mEq/L (ref 3.5–5.1)
Sodium: 139 mEq/L (ref 135–145)
Total Bilirubin: 0.5 mg/dL (ref 0.2–1.2)
Total Protein: 7 g/dL (ref 6.0–8.3)

## 2020-07-11 LAB — LIPID PANEL
Cholesterol: 223 mg/dL — ABNORMAL HIGH (ref 0–200)
HDL: 36.5 mg/dL — ABNORMAL LOW (ref >39.00)
LDL Cholesterol: 152 mg/dL — ABNORMAL HIGH (ref 0–99)
NonHDL: 186.54
Total CHOL/HDL Ratio: 6
Triglycerides: 174 mg/dL — ABNORMAL HIGH (ref 0.0–149.0)
VLDL: 34.8 mg/dL (ref 0.0–40.0)

## 2020-07-11 LAB — HEMOGLOBIN A1C: Hgb A1c MFr Bld: 6.2 % (ref 4.6–6.5)

## 2020-07-11 LAB — TSH: TSH: 5.51 u[IU]/mL — ABNORMAL HIGH (ref 0.35–4.50)

## 2020-07-11 NOTE — Assessment & Plan Note (Signed)
-  Td 2019 - moderna x 2, booster rec - to have a flu shot  @ work - CCS: Recommend to start CCS with  I fob - Labs: CMP, FLP, CBC, A1c, TSH, T4, I fob - Diet exercise: Counseled.

## 2020-07-11 NOTE — Assessment & Plan Note (Signed)
For CPX Prediabetes: Checking labs HTN: Not well controlled, at home is consistently elevated.   Only on  benazepril 40 mg, metoprolol XL 100 mg, not taking amlodipine (reason?) Plan: Take  medications as prescribed, refer back to Dr Tresa Endo (HTN management, OSA?) OSA?  Refer back to cardiology, they were planning home sleep study. Cellulitis, right leg: Resolved, still the right calf is larger but no evidence of active infection. ED: Would like to take daily tadalafil, Rx printed.  If cost is an issue continue sildenafil.  At some point had NTG at home but that ran out, nevertheless precautions discussed Morbid obesity: This remains a major challenge, wellness clinic information provided RTC for lab fasting in few days RTC office visit 3 months

## 2020-07-12 ENCOUNTER — Other Ambulatory Visit: Payer: Self-pay

## 2020-07-12 MED ORDER — TADALAFIL 2.5 MG PO TABS: 1.0000 | ORAL_TABLET | Freq: Every day | ORAL | 6 refills | Status: DC

## 2020-07-13 ENCOUNTER — Telehealth: Payer: Self-pay | Admitting: Internal Medicine

## 2020-07-13 MED ORDER — TADALAFIL 2.5 MG PO TABS: 1.0000 | ORAL_TABLET | Freq: Every day | ORAL | 6 refills | Status: DC

## 2020-07-13 NOTE — Telephone Encounter (Signed)
Tadalafil 2.5 MG TABS [[id number]]    [facility] - [place], [place], [place] - [address] Phone:  [phone]  Fax:  [phone]

## 2020-07-13 NOTE — Telephone Encounter (Signed)
Rx sent 

## 2020-08-16 ENCOUNTER — Encounter: Payer: Self-pay | Admitting: General Practice

## 2020-09-10 ENCOUNTER — Other Ambulatory Visit: Payer: Self-pay | Admitting: Internal Medicine

## 2020-09-19 ENCOUNTER — Other Ambulatory Visit: Payer: Self-pay

## 2020-09-19 ENCOUNTER — Encounter: Payer: Self-pay | Admitting: Internal Medicine

## 2020-09-19 ENCOUNTER — Ambulatory Visit (INDEPENDENT_AMBULATORY_CARE_PROVIDER_SITE_OTHER): Payer: Managed Care, Other (non HMO) | Admitting: Internal Medicine

## 2020-09-19 VITALS — BP 144/92 | HR 92 | Temp 98.5°F | Resp 18 | Ht 70.0 in | Wt 284.4 lb

## 2020-09-19 DIAGNOSIS — R600 Localized edema: Secondary | ICD-10-CM

## 2020-09-19 DIAGNOSIS — I10 Essential (primary) hypertension: Secondary | ICD-10-CM | POA: Diagnosis not present

## 2020-09-19 MED ORDER — METOPROLOL SUCCINATE ER 100 MG PO TB24: 100.0000 mg | ORAL_TABLET | Freq: Two times a day (BID) | ORAL | 1 refills | Status: DC

## 2020-09-19 NOTE — Patient Instructions (Addendum)
Stop amlodipine  Increase metoprolol to 100 mg to 1 tablet twice a day  Watch your salt intake  Compression stockings 15 to 20 mmHg  Check the  blood pressure 2 or 3 times a week BP GOAL is between 110/65 and  135/85. If it is consistently higher or lower, let me know    GO TO THE FRONT DESK, PLEASE SCHEDULE YOUR APPOINTMENTS Come back for for a checkup in 4 to 5 weeks

## 2020-09-19 NOTE — Progress Notes (Unsigned)
Subjective:    Patient ID: Garrett Bell, male    DOB: 04/02/1976, 45 y.o.   MRN: [id number]  DOS:  09/19/2020 Type of visit - description: Acute  The patient had cellulitis about October, it took a while to get better. Since then, his R calf is bigger compared to the left and he has periankle edema at the end of the day. The leg does not feel warm and is not red.  Not painful.  Review of Systems Denies fever chills No dysuria or gross hematuria No abdominal pain, diarrhea or blood in the stools.  Past Medical History:  Diagnosis Date  . Chest pain    neg stress test 01/2009  . Diabetes (HCC)    A1C 6.4 (2012)  . GERD (gastroesophageal reflux disease)   . Headache(784.0)   . HTN (hypertension)   . Traumatic partial tear of right biceps tendon 06/2015    Past Surgical History:  Procedure Laterality Date  . biceps surgery Right 07-04-15   tendon reattachment  . CARPAL TUNNEL RELEASE Bilateral 06/11/2017  . HEMORRHOID SURGERY  07-2008   thrombosed  . VASECTOMY      Allergies as of 09/19/2020      Reactions   Hydrochlorothiazide Anaphylaxis   Sulfonamide Derivatives Anaphylaxis      Medication List       Accurate as of September 19, 2020  3:37 PM. If you have any questions, ask your nurse or doctor.        amLODipine 10 MG tablet Commonly known as: NORVASC Take 1 tablet (10 mg total) by mouth daily.   benazepril 40 MG tablet Commonly known as: LOTENSIN Take 1 tablet (40 mg total) by mouth daily.   esomeprazole 40 MG capsule Commonly known as: NEXIUM Take 1 capsule (40 mg total) by mouth daily.   famotidine 20 MG tablet Commonly known as: PEPCID Take 1 tablet (20 mg total) by mouth 2 (two) times daily before lunch and supper.   metoprolol succinate 100 MG 24 hr tablet Commonly known as: Toprol XL Take 1 tablet (100 mg total) by mouth daily. Take with or immediately following a meal.   sildenafil 20 MG tablet Commonly known as: REVATIO Take 4 tablets  (80 mg total) by mouth daily as needed.   Tadalafil 2.5 MG Tabs Take 1 tablet (2.5 mg total) by mouth daily.          Objective:   Physical Exam BP (!) 144/92 (BP Location: Right Arm, Patient Position: Sitting, Cuff Size: Normal)   Pulse 92   Temp 98.5 F (36.9 C) (Oral)   Resp 18   Ht 5\' 10"  (1.778 m)   Wt 284 lb 6 oz (129 kg)   SpO2 98%   BMI 40.80 kg/m  General:   Well developed, NAD, BMI noted.  HEENT:  Normocephalic . Face symmetric, atraumatic Abdomen:  Not distended, soft, non-tender. No rebound or rigidity. Groins: No lymphadenopathy Vascular: Normal femoral and pedal pulses Skin: Not pale. Not jaundice Lower extremities:  Right calf is larger by 1-1/4 inch on circumference compared to the left but is not red, warm or TTP. + Edema my periankle: R  ++/+++, L: Trace Neurologic:  alert & oriented X3.  Speech normal, gait appropriate for age and unassisted Psych--  Cognition and judgment appear intact.  Cooperative with normal attention span and concentration.  Behavior appropriate. No anxious or depressed appearing.     Assessment     Assessment Prediabetes dx- 2012, A1c 6.4  HTN - (HCTZ and sulfa) : anaphylaxis,  -Saw cardiology 08/10/2019: -Echo: Mild LVH, grade 1 diastolic dysfunction. -Vascular ultrasound: Mild bilateral stenosis of renal artery.  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 He is also on amlodipine and that may be playing a role. Plan: Stop amlodipine, increase metoprolol XL 100 mg to twice daily to compensate (heart rate today in the 90s). Continue Lotensin.  Monitor BPs.  Use OTC compression stockings, 15 to  20 mmHg. Follow-up in 4 to 5 weeks.  Consider vascular referral. HTN: See above RTC 4 to 5 weeks   This visit occurred during the SARS-CoV-2 public health emergency.  Safety protocols were in place, including screening questions prior to the visit, additional usage of staff PPE, and extensive cleaning of exam room while observing appropriate contact time as indicated for disinfecting solutions.

## 2020-09-19 NOTE — Progress Notes (Signed)
Pre visit review using our clinic review tool, if applicable. No additional management support is needed unless otherwise documented below in the visit note. 

## 2020-09-21 NOTE — Assessment & Plan Note (Signed)
Right leg edema, larger right calf: Probably venous insufficiency, triggered by or  made worse but right leg cellulitis October 2021. He is also on amlodipine and that may be playing a role. Plan: Stop amlodipine, increase metoprolol XL 100 mg to twice daily to compensate (heart rate today in the 90s). Continue Lotensin.  Monitor BPs.  Use OTC compression stockings, 15 to 20 mmHg. Follow-up in 4 to 5 weeks.  Consider vascular referral. HTN: See above RTC 4 to 5 weeks

## 2020-10-12 ENCOUNTER — Encounter: Payer: Self-pay | Admitting: Internal Medicine

## 2020-10-12 ENCOUNTER — Other Ambulatory Visit: Payer: Self-pay

## 2020-10-12 ENCOUNTER — Ambulatory Visit (INDEPENDENT_AMBULATORY_CARE_PROVIDER_SITE_OTHER): Payer: Managed Care, Other (non HMO) | Admitting: Internal Medicine

## 2020-10-12 VITALS — BP 164/96 | HR 61 | Temp 98.0°F | Resp 16 | Ht 70.0 in | Wt 285.2 lb

## 2020-10-12 DIAGNOSIS — I10 Essential (primary) hypertension: Secondary | ICD-10-CM

## 2020-10-12 DIAGNOSIS — Z0189 Encounter for other specified special examinations: Secondary | ICD-10-CM | POA: Diagnosis not present

## 2020-10-12 MED ORDER — HYDRALAZINE HCL 25 MG PO TABS: 25.0000 mg | ORAL_TABLET | Freq: Two times a day (BID) | ORAL | 3 refills | Status: DC

## 2020-10-12 NOTE — Progress Notes (Signed)
Subjective:    Patient ID: Garrett Bell, male    DOB: 01-02-1976, 45 y.o.   MRN: [id number]  DOS:  10/12/2020 Type of visit - description: rov Since the last office visit he is feeling well. Edema decreased Good med compliance No ambulatory BPs   Review of Systems See above   Past Medical History:  Diagnosis Date  . Chest pain    neg stress test 01/2009  . Diabetes (HCC)    A1C 6.4 (2012)  . GERD (gastroesophageal reflux disease)   . Headache(784.0)   . HTN (hypertension)   . Traumatic partial tear of right biceps tendon 06/2015    Past Surgical History:  Procedure Laterality Date  . biceps surgery Right 07-04-15   tendon reattachment  . CARPAL TUNNEL RELEASE Bilateral 06/11/2017  . HEMORRHOID SURGERY  07-2008   thrombosed  . VASECTOMY      Allergies as of 10/12/2020      Reactions   Hydrochlorothiazide Anaphylaxis   Sulfonamide Derivatives Anaphylaxis      Medication List       Accurate as of October 12, 2020 11:59 PM. If you have any questions, ask your nurse or doctor.        benazepril 40 MG tablet Commonly known as: LOTENSIN Take 1 tablet (40 mg total) by mouth daily.   esomeprazole 40 MG capsule Commonly known as: NEXIUM Take 1 capsule (40 mg total) by mouth daily.   famotidine 20 MG tablet Commonly known as: PEPCID Take 1 tablet (20 mg total) by mouth 2 (two) times daily before lunch and supper.   hydrALAZINE 25 MG tablet Commonly known as: APRESOLINE Take 1 tablet (25 mg total) by mouth 2 (two) times daily. Started by: Willow Ora, MD   metoprolol succinate 100 MG 24 hr tablet Commonly known as: Toprol XL Take 1 tablet (100 mg total) by mouth in the morning and at bedtime. Take with or immediately following a meal.   sildenafil 20 MG tablet Commonly known as: REVATIO Take 4 tablets (80 mg total) by mouth daily as needed.   Tadalafil 2.5 MG Tabs Take 1 tablet (2.5 mg total) by mouth daily.          Objective:   Physical Exam BP (!)  164/96 (BP Location: Left Arm, Patient Position: Sitting, Cuff Size: Normal)   Pulse 61   Temp 98 F (36.7 C) (Oral)   Resp 16   Ht 5\' 10"  (1.778 m)   Wt 285 lb 4 oz (129.4 kg)   SpO2 97%   BMI 40.93 kg/m  General:   Well developed, NAD, BMI noted. HEENT:  Normocephalic . Face symmetric, atraumatic Lungs:  CTA B Normal respiratory effort, no intercostal retractions, no accessory muscle use. Heart: RRR,  no murmur.  Lower extremities: No pitting edema Right calf remain larger by almost 1 inch in circumference, the calf is soft and nontender Skin: Not pale. Not jaundice Neurologic:  alert & oriented X3.  Speech normal, gait appropriate for age and unassisted Psych--  Cognition and judgment appear intact.  Cooperative with normal attention span and concentration.  Behavior appropriate. No anxious or depressed appearing.      Assessment      Assessment Prediabetes dx- 2012, A1c 6.4 HTN - (HCTZ and sulfa) : anaphylaxis,  -Saw cardiology 08/10/2019: -Echo: Mild LVH, grade 1 diastolic dysfunction. 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 -At last visit amlodipine was discontinue, edema definitely improved. -Currently on metoprolol 100 mg twice daily, Lotensin 40 mg daily. -Unable to prescribe HCTZ or sulfa, had anaphylaxis Needs better control -Add  hydralazine 25 mg twice daily -Refer to neurology, OSA? -Redouble efforts to decrease salt intake -Check ambulatory BPs, he works at [employer],   pharmacist to check twice weekly. Right leg edema, larger left calf: Edema definitely better after amlodipine discontinued. Right calf remained  larger by 1 inch in diameter. Morbid obesity: Recommend a structured plan, weight watchers? NOOM?  Wellness clinic? Increased TSH: Recheck on RTC RTC 3 months   This visit occurred during the SARS-CoV-2 public health emergency.  Safety protocols were in place, including screening questions prior to the visit, additional usage of staff PPE, and extensive cleaning of exam room while observing appropriate contact time as indicated for disinfecting solutions.

## 2020-10-12 NOTE — Patient Instructions (Addendum)
Happy belated [place] Ouch!  Hydralazine 25 mg twice daily  Check the  blood pressure 2 or 3 times a week BP GOAL is between 110/65 and  135/85. If it is consistently higher or lower, let me know  NOOM?  [employer]?   GO TO THE FRONT DESK, PLEASE SCHEDULE YOUR APPOINTMENTS Come back for a checkup in 3 months

## 2020-10-13 NOTE — Assessment & Plan Note (Signed)
HTN: No ambulatory BPs, BP today 164/96, I rechecked manually 155/100. -At last visit amlodipine was discontinue, edema definitely improved. -Currently on metoprolol 100 mg twice daily, Lotensin 40 mg daily. -Unable to prescribe HCTZ or sulfa, had anaphylaxis Needs better control -Add  hydralazine 25 mg twice daily -Refer to neurology, OSA? -Redouble efforts to decrease salt intake -Check ambulatory BPs, he works at [employer],   pharmacist to check twice weekly. Right leg edema, larger left calf: Edema definitely better after amlodipine discontinued. Right calf remained larger by 1 inch in diameter. Morbid obesity: Recommend a structured plan, weight watchers? NOOM?  Wellness clinic? Increased TSH: Recheck on RTC RTC 3 months

## 2020-10-19 ENCOUNTER — Ambulatory Visit: Payer: Managed Care, Other (non HMO) | Admitting: Internal Medicine

## 2020-12-05 ENCOUNTER — Ambulatory Visit (INDEPENDENT_AMBULATORY_CARE_PROVIDER_SITE_OTHER): Payer: Managed Care, Other (non HMO) | Admitting: Neurology

## 2020-12-05 ENCOUNTER — Encounter: Payer: Self-pay | Admitting: Neurology

## 2020-12-05 VITALS — BP 175/110 | HR 74 | Ht 70.5 in | Wt 290.5 lb

## 2020-12-05 DIAGNOSIS — R351 Nocturia: Secondary | ICD-10-CM

## 2020-12-05 DIAGNOSIS — R635 Abnormal weight gain: Secondary | ICD-10-CM

## 2020-12-05 DIAGNOSIS — R030 Elevated blood-pressure reading, without diagnosis of hypertension: Secondary | ICD-10-CM

## 2020-12-05 DIAGNOSIS — R0683 Snoring: Secondary | ICD-10-CM

## 2020-12-05 DIAGNOSIS — I10 Essential (primary) hypertension: Secondary | ICD-10-CM

## 2020-12-05 NOTE — Progress Notes (Signed)
Subjective:    Patient ID: Garrett FellersJacob E Bell is a 45 y.o. male.  HPI     Huston FoleySaima Elif Yonts, MD, PhD [facility] [address], [address] [address] [place] [address]  Dear Dr. Drue NovelPaz,   I saw your patient, Garrett Bell, upon your kind request in my sleep clinic today for initial consultation of his sleep disorder, in particular, concern for underlying obstructive sleep apnea.  The patient is unaccompanied today.  As you know, Garrett Bell is a 45 year old right-handed gentleman with an underlying medical history of reflux disease, pre-diabetes, history of cellulitis of the right leg, history of chest pain, treatment resistant hypertension and severe obesity with a BMI of over 40, who reports snoring and ongoing difficulty with BP control. I reviewed your office note from 10/12/2020.  Was on metoprolol and lisinopril at the time.  He had swelling with amlodipine and discontinued it.  He could not tolerate hydrochlorothiazide but was advised to start hydralazine 25 mg twice daily.  He reports that he took it for about 2 weeks but it caused swelling in his lower extremities so he stopped it.  He has not discussed further treatment options with you since his March visit. His Epworth sleepiness score is 1 out of 24, fatigue severity score is 16 out of 63.  He is married and lives with his wife and 2 of his 6 children.  They have 1 dog and 2 cats in the household, dog sleeps in the bedroom and is quite.  He does not have a TV in the bedroom.  In the past year or 2 he has had some weight gain, in the realm of 20 or 30 pounds.  He works as a Social research officer, governmentstore manager.  Bedtime is generally between 9 and 11 PM and rise time around 6 AM.  He is a non-smoker and drinks alcohol rarely, no daily caffeine.  He has no obvious family history of sleep apnea.  He denies recurrent morning headaches but has nocturia about once per average night.  His Past Medical History Is Significant For: Past Medical History:   Diagnosis Date  . Chest pain    neg stress test 01/2009  . Diabetes (HCC)    A1C 6.4 (2012)  . GERD (gastroesophageal reflux disease)   . Headache(784.0)   . HTN (hypertension)   . Traumatic partial tear of right biceps tendon 06/2015    His Past Surgical History Is Significant For: Past Surgical History:  Procedure Laterality Date  . biceps surgery Right 07-04-15   tendon reattachment  . CARPAL TUNNEL RELEASE Bilateral 06/11/2017  . HEMORRHOID SURGERY  07-2008   thrombosed  . VASECTOMY      His Family History Is Significant For: Family History  Problem Relation Age of Onset  . Hypertension Father   . Stroke Other        GF  . Aneurysm Other        GM, brain  . CAD Maternal Grandmother   . Colon cancer Neg Hx   . Prostate cancer Neg Hx     His Social History Is Significant For: Social History   Socioeconomic History  . Marital status: Married    Spouse name: Not on file  . Number of children: 5  . Years of education: Not on file  . Highest education level: Not on file  Occupational History  . Occupation: Conservator, museum/gallerymanager [employer]   Tobacco Use  . Smoking status: Never Smoker  . Smokeless tobacco:  Never Used  Vaping Use  . Vaping Use: Never used  Substance and Sexual Activity  . Alcohol use: No  . Drug use: No  . Sexual activity: Yes  Other Topics Concern  . Not on file  Social History Narrative   divorced 2011, 5 children    household is pt, 3 of the children and wife (remarried)   Social Determinants of Corporate investment banker Strain: Not on file  Food Insecurity: Not on file  Transportation Needs: Not on file  Physical Activity: Not on file  Stress: Not on file  Social Connections: Not on file    His Allergies Are:  Allergies  Allergen Reactions  . Hydrochlorothiazide Anaphylaxis  . Sulfonamide Derivatives Anaphylaxis  :   His Current Medications Are:  Outpatient Encounter Medications as of 12/05/2020  Medication Sig  . benazepril  (LOTENSIN) 40 MG tablet Take 1 tablet (40 mg total) by mouth daily.  Marland Kitchen esomeprazole (NEXIUM) 40 MG capsule Take 1 capsule (40 mg total) by mouth daily.  . famotidine (PEPCID) 20 MG tablet Take 1 tablet (20 mg total) by mouth 2 (two) times daily before lunch and supper.  . metoprolol succinate (TOPROL XL) 100 MG 24 hr tablet Take 1 tablet (100 mg total) by mouth in the morning and at bedtime. Take with or immediately following a meal.  . Tadalafil 2.5 MG TABS Take 1 tablet (2.5 mg total) by mouth daily.  . [DISCONTINUED] hydrALAZINE (APRESOLINE) 25 MG tablet Take 1 tablet (25 mg total) by mouth 2 (two) times daily.  . [DISCONTINUED] sildenafil (REVATIO) 20 MG tablet Take 4 tablets (80 mg total) by mouth daily as needed. (Patient not taking: No sig reported)   No facility-administered encounter medications on file as of 12/05/2020.  :   Review of Systems:  Out of a complete 14 point review of systems, all are reviewed and negative with the exception of these symptoms as listed below:  Review of Systems  Neurological:       Here for sleep consult. No prior sleep study. Pt reports snoring is present and hx of uncontrolled HTN.  Epworth Sleepiness Scale 0= would never doze 1= slight chance of dozing 2= moderate chance of dozing 3= high chance of dozing  Sitting and reading:0 Watching TV:1 Sitting inactive in a public place (ex. Theater or meeting):0 As a passenger in a car for an hour without a break:0 Lying down to rest in the afternoon:0 Sitting and talking to someone:0 Sitting quietly after lunch (no alcohol):0 In a car, while stopped in traffic:0 Total:1     Objective:  Neurological Exam  Physical Exam Physical Examination:   Vitals:   12/05/20 1318  BP: (!) 175/110  Pulse: 74    General Examination: The patient is a very pleasant 45 y.o. male in no acute distress. He appears well-developed and well-nourished and well groomed.  He denies any blurry vision, headache,  chest pain or shortness of breath with his elevated blood pressure reading today.  HEENT: Normocephalic, atraumatic, pupils are equal, round and reactive to light, extraocular tracking is good without limitation to gaze excursion or nystagmus noted. Hearing is grossly intact. Face is symmetric with normal facial animation. Speech is clear with no dysarthria noted. There is no hypophonia. There is no lip, neck/head, jaw or voice tremor. Neck is supple with full range of passive and active motion. There are no carotid bruits on auscultation. Oropharynx exam reveals: mild mouth dryness, adequate dental hygiene and moderate airway  crowding, due to small airway entry, tonsillar size of 1-2+ on the right and 2+ on the left, prominent uvula.  Tongue protrudes centrally and palate elevates symmetrically.  Mallampati class II.  Neck circumference of 18-7/8 inches.  He has no overbite.  Chest: Clear to auscultation without wheezing, rhonchi or crackles noted.  Heart: S1+S2+0, regular and normal without murmurs, rubs or gallops noted.   Abdomen: Soft, non-tender and non-distended with normal bowel sounds appreciated on auscultation.  Extremities: There is no pitting edema in the distal lower extremities bilaterally.   Skin: Warm and dry without trophic changes noted.   Musculoskeletal: exam reveals no obvious joint deformities, tenderness or joint swelling or erythema.   Neurologically:  Mental status: The patient is awake, alert and oriented in all 4 spheres. His immediate and remote memory, attention, language skills and fund of knowledge are appropriate. There is no evidence of aphasia, agnosia, apraxia or anomia. Speech is clear with normal prosody and enunciation. Thought process is linear. Mood is normal and affect is normal.  Cranial nerves II - XII are as described above under HEENT exam.  Motor exam: Normal bulk, strength and tone is noted. There is no tremor, fine motor skills and coordination:  grossly intact.  Cerebellar testing: No dysmetria or intention tremor. There is no truncal or gait ataxia.  Sensory exam: intact to light touch in the upper and lower extremities.  Gait, station and balance: He stands easily. No veering to one side is noted. No leaning to one side is noted. Posture is age-appropriate and stance is narrow based. Gait shows normal stride length and normal pace. No problems turning are noted. Tandem walk is unremarkable.                Assessment and Plan:  In summary, Garrett Bell is a very pleasant 45 y.o.-year old male with an underlying medical history of reflux disease, pre-diabetes, history of cellulitis of the right leg, history of chest pain, treatment resistant hypertension and severe obesity with a BMI of over 40, whose history and physical exam are concerning for obstructive sleep apnea (OSA). I had a long chat with the patient about my findings and the diagnosis of OSA, its prognosis and treatment options. We talked about medical treatments, surgical interventions and non-pharmacological approaches. I explained in particular the risks and ramifications of untreated moderate to severe OSA, especially with respect to developing cardiovascular disease down the Road, including congestive heart failure, difficult to treat hypertension, cardiac arrhythmias, or stroke. Even type 2 diabetes has, in part, been linked to untreated OSA. Symptoms of untreated OSA include daytime sleepiness, memory problems, mood irritability and mood disorder such as depression and anxiety, lack of energy, as well as recurrent headaches, especially morning headaches. We talked about trying to maintain a healthy lifestyle in general, as well as the importance of weight control. We also talked about the importance of good sleep hygiene. I recommended the following at this time: sleep study.  He is furthermore advised to get in touch with you through either MyChart or phone call regarding his  blood pressure medication regimen as he recently stopped taking hydralazine due to swelling reported.  His blood pressure was elevated today.  He did not have any symptoms with that. I explained the sleep test procedure to the patient and also outlined possible surgical and non-surgical treatment options of OSA, including the use of a custom-made dental device (which would require a referral to a specialist dentist or oral  surgeon), upper airway surgical options, such as traditional UPPP or a novel less invasive surgical option in the form of Inspire hypoglossal nerve stimulation (which would involve a referral to an ENT surgeon).  I explained the difference between a laboratory attended sleep study and a home sleep test.  I also explained the CPAP treatment option to the patient, who indicated that he would be willing to try CPAP if the need arises. I answered all his questions today and the patient was in agreement. I plan to see him back after the sleep study is completed and encouraged him to call with any interim questions, concerns, problems or updates.   Thank you very much for allowing me to participate in the care of this nice patient. If I can be of any further assistance to you please do not hesitate to call me at [phone].  Sincerely,   Huston Foley, MD, PhD

## 2020-12-05 NOTE — Patient Instructions (Signed)

## 2020-12-18 ENCOUNTER — Telehealth: Payer: Self-pay

## 2020-12-18 NOTE — Telephone Encounter (Signed)
LVM for pt to call me back to schedule sleep study  

## 2020-12-21 ENCOUNTER — Telehealth: Payer: Self-pay | Admitting: Internal Medicine

## 2020-12-21 NOTE — Telephone Encounter (Signed)
Pt, states he need a prescription his celluliti Has flare up again  Please advice

## 2020-12-22 NOTE — Telephone Encounter (Signed)
Called pt back to clarify question, he stated that he ended up going to the urgent care on 12/21/2020 and they RX an antibiotic for 10 days. Pt was advised that if not doing better to f/u with Dr. Drue Novel after he finishes his antibiotic. -JMA

## 2020-12-26 ENCOUNTER — Other Ambulatory Visit: Payer: Self-pay

## 2020-12-26 ENCOUNTER — Telehealth: Payer: Self-pay

## 2020-12-26 ENCOUNTER — Ambulatory Visit (INDEPENDENT_AMBULATORY_CARE_PROVIDER_SITE_OTHER): Payer: Managed Care, Other (non HMO) | Admitting: Medical

## 2020-12-26 ENCOUNTER — Encounter: Payer: Self-pay | Admitting: Medical

## 2020-12-26 VITALS — BP 142/92 | HR 71 | Temp 98.4°F | Resp 18 | Ht 70.0 in | Wt 281.0 lb

## 2020-12-26 DIAGNOSIS — R509 Fever, unspecified: Secondary | ICD-10-CM | POA: Diagnosis not present

## 2020-12-26 DIAGNOSIS — L0390 Cellulitis, unspecified: Secondary | ICD-10-CM | POA: Diagnosis not present

## 2020-12-26 LAB — CBC WITH DIFFERENTIAL/PLATELET
Basophils Absolute: 0 10*3/uL (ref 0.0–0.1)
Basophils Relative: 0.6 % (ref 0.0–3.0)
Eosinophils Absolute: 0.1 10*3/uL (ref 0.0–0.7)
Eosinophils Relative: 2.1 % (ref 0.0–5.0)
HCT: 48 % (ref 39.0–52.0)
Hemoglobin: 16.1 g/dL (ref 13.0–17.0)
Lymphocytes Relative: 25.3 % (ref 12.0–46.0)
Lymphs Abs: 1.8 10*3/uL (ref 0.7–4.0)
MCHC: 33.6 g/dL (ref 30.0–36.0)
MCV: 89.5 fl (ref 78.0–100.0)
Monocytes Absolute: 0.8 10*3/uL (ref 0.1–1.0)
Monocytes Relative: 12 % (ref 3.0–12.0)
Neutro Abs: 4.2 10*3/uL (ref 1.4–7.7)
Neutrophils Relative %: 60 % (ref 43.0–77.0)
Platelets: 215 10*3/uL (ref 150.0–400.0)
RBC: 5.36 Mil/uL (ref 4.22–5.81)
RDW: 13.4 % (ref 11.5–15.5)
WBC: 7 10*3/uL (ref 4.0–10.5)

## 2020-12-26 LAB — SEDIMENTATION RATE: Sed Rate: 26 mm/hr — ABNORMAL HIGH (ref 0–15)

## 2020-12-26 LAB — C-REACTIVE PROTEIN: CRP: 4 mg/dL (ref 0.5–20.0)

## 2020-12-26 MED ORDER — CLINDAMYCIN HCL 300 MG PO CAPS: 300.0000 mg | ORAL_CAPSULE | Freq: Three times a day (TID) | ORAL | 0 refills | Status: DC

## 2020-12-26 MED ORDER — CEFTRIAXONE SODIUM 1 G IJ SOLR
1.0000 g | Freq: Once | INTRAMUSCULAR | Status: AC
  Administered 2020-12-26: 1 g via INTRAMUSCULAR

## 2020-12-26 NOTE — Telephone Encounter (Signed)
We have attempted to call the patient 2 times to schedule sleep study. Patient has been unavailable at the phone numbers we have on file and has not returned our calls. If patient calls back we will schedule them for their sleep study. ° °

## 2020-12-26 NOTE — Patient Instructions (Addendum)
Right lower extremity cellulitis that appears to be worsening since yesterday after review of your recent history.  Recommend that you discontinue doxycycline and will prescribe clindamycin 300 mg 3 times daily.  Rx advisement given.  Please eat probiotic rich foods as well as get probiotic tabs over-the-counter.  1 g of Rocephin given today.  If you get any pain behind your knee let us know and in that event would get ultrasound of lower extremity.     We gave you 1 g Rocephin IM today.  Will get CBC, sed rate, c reactive protein and lactate level.  If red area expands/worsens as discussed then recommend ED evaluation as IM antibiotics may be neated.  Follow up in 2 days or as needed

## 2020-12-26 NOTE — Progress Notes (Signed)
Subjective:    Patient ID: Garrett Bell, male    DOB: September 08, 1975, 45 y.o.   MRN: [id number]  HPI  Pt in for rt lower extremity redness and swelling. Pt was seen last week by [facility] urgent care. Pt was placed on doxycycline antibiotic. Pt has been taking since Thursday night.   Pt states on Thursday he only redness to his posterior calf.  But then yesterday he got redness to anterior calf.   Pt had some fever, chills and sweats on Thursday. Friday, Saturday and Sunday fever went away. Last night got fever again.  No sob or wheezing. No popliteal pain.    Review of Systems  Constitutional: Positive for fever. Negative for chills and fatigue.  Respiratory: Negative for cough, chest tightness and wheezing.   Cardiovascular: Negative for chest pain and palpitations.  Gastrointestinal: Negative for abdominal pain.  Musculoskeletal:       See hpi.  Skin: Positive for rash.  Neurological: Negative for dizziness, weakness and light-headedness.  Hematological: Negative for adenopathy. Does not bruise/bleed easily.  Psychiatric/Behavioral: Negative for behavioral problems and confusion.       Past Medical History:  Diagnosis Date  . Chest pain    neg stress test 01/2009  . Diabetes (HCC)    A1C 6.4 (2012)  . GERD (gastroesophageal reflux disease)   . Headache(784.0)   . HTN (hypertension)   . Traumatic partial tear of right biceps tendon 06/2015     Social History   Socioeconomic History  . Marital status: Married    Spouse name: Not on file  . Number of children: 5  . Years of education: Not on file  . Highest education level: Not on file  Occupational History  . Occupation: Conservator, museum/gallery   Tobacco Use  . Smoking status: Never Smoker  . Smokeless tobacco: Never Used  Vaping Use  . Vaping Use: Never used  Substance and Sexual Activity  . Alcohol use: No  . Drug use: No  . Sexual activity: Yes  Other Topics Concern  . Not on file  Social History  Narrative   divorced 2011, 5 children    household is pt, 3 of the children and wife (remarried)   Social Determinants of Corporate investment banker Strain: Not on file  Food Insecurity: Not on file  Transportation Needs: Not on file  Physical Activity: Not on file  Stress: Not on file  Social Connections: Not on file  Intimate Partner Violence: Not on file    Past Surgical History:  Procedure Laterality Date  . biceps surgery Right 07-04-15   tendon reattachment  . CARPAL TUNNEL RELEASE Bilateral 06/11/2017  . HEMORRHOID SURGERY  07-2008   thrombosed  . VASECTOMY      Family History  Problem Relation Age of Onset  . Hypertension Father   . Stroke Other        GF  . Aneurysm Other        GM, brain  . CAD Maternal Grandmother   . Colon cancer Neg Hx   . Prostate cancer Neg Hx     Allergies  Allergen Reactions  . Hydrochlorothiazide Anaphylaxis  . Sulfonamide Derivatives Anaphylaxis    Current Outpatient Medications on File Prior to Visit  Medication Sig Dispense Refill  . benazepril (LOTENSIN) 40 MG tablet Take 1 tablet (40 mg total) by mouth daily. 90 tablet 1  . esomeprazole (NEXIUM) 40 MG capsule Take 1 capsule (40 mg total) by mouth  daily. 30 capsule 0  . famotidine (PEPCID) 20 MG tablet Take 1 tablet (20 mg total) by mouth 2 (two) times daily before lunch and supper. 180 tablet 3  . metoprolol succinate (TOPROL XL) 100 MG 24 hr tablet Take 1 tablet (100 mg total) by mouth in the morning and at bedtime. Take with or immediately following a meal. 180 tablet 1  . Tadalafil 2.5 MG TABS Take 1 tablet (2.5 mg total) by mouth daily. 30 tablet 6   No current facility-administered medications on file prior to visit.    BP (!) 154/100   Pulse 71   Temp 98.4 F (36.9 C)   Resp 18   Ht 5\' 10"  (1.778 m)   Wt 281 lb (127.5 kg)   SpO2 95%   BMI 40.32 kg/m       Objective:   Physical Exam   General- No acute distress. Pleasant patient. Neck- Full range of  motion, no jvd Lungs- Clear, even and unlabored. Heart- regular rate and rhythm. Neurologic- CNII- XII grossly intact.  Rt lower ext- anterior calf area/pretibial region that is  Red,  warm to palpation, swollen and indurated. Area is about 14 cm x 8 cm wrapping around posterir calf. Back of calf does not look swollen. Negative homans signs.  Rt thigh- one circular hyperpimented area distal thigh about 2.5 cm wide mild tender. No warm to touch. No induration.      Assessment & Plan:  Right lower extremity cellulitis that appears to be worsening since yesterday after review of your recent history.  Recommend that you discontinue doxycycline and will prescribe clindamycin 300 mg 3 times daily.  Rx advisement given.  Please eat probiotic rich foods as well as get probiotic tabs over-the-counter.  1 g of Rocephin given today.  If you get any pain behind your knee let know and in that event would get ultrasound of lower extremity.     We gave you 1 g Rocephin IM today.  Will get CBC, sed rate, c reactive protein and lactate level.  Follow up in 2 days or as needed

## 2020-12-27 LAB — LACTIC ACID, PLASMA: LACTIC ACID: 0.6 mmol/L (ref 0.4–1.8)

## 2020-12-28 ENCOUNTER — Ambulatory Visit (INDEPENDENT_AMBULATORY_CARE_PROVIDER_SITE_OTHER): Payer: Managed Care, Other (non HMO) | Admitting: Medical

## 2020-12-28 ENCOUNTER — Other Ambulatory Visit: Payer: Self-pay

## 2020-12-28 VITALS — BP 137/90 | HR 73 | Resp 20 | Ht 70.0 in | Wt 278.0 lb

## 2020-12-28 DIAGNOSIS — R700 Elevated erythrocyte sedimentation rate: Secondary | ICD-10-CM | POA: Diagnosis not present

## 2020-12-28 DIAGNOSIS — L0390 Cellulitis, unspecified: Secondary | ICD-10-CM

## 2020-12-28 LAB — SEDIMENTATION RATE: Sed Rate: 17 mm/hr — ABNORMAL HIGH (ref 0–15)

## 2020-12-28 LAB — CBC WITH DIFFERENTIAL/PLATELET
Basophils Absolute: 0.1 10*3/uL (ref 0.0–0.1)
Basophils Relative: 1.2 % (ref 0.0–3.0)
Eosinophils Absolute: 0.1 10*3/uL (ref 0.0–0.7)
Eosinophils Relative: 1.9 % (ref 0.0–5.0)
HCT: 47.9 % (ref 39.0–52.0)
Hemoglobin: 16 g/dL (ref 13.0–17.0)
Lymphocytes Relative: 34.1 % (ref 12.0–46.0)
Lymphs Abs: 2 10*3/uL (ref 0.7–4.0)
MCHC: 33.3 g/dL (ref 30.0–36.0)
MCV: 90.5 fl (ref 78.0–100.0)
Monocytes Absolute: 0.6 10*3/uL (ref 0.1–1.0)
Monocytes Relative: 11 % (ref 3.0–12.0)
Neutro Abs: 3 10*3/uL (ref 1.4–7.7)
Neutrophils Relative %: 51.8 % (ref 43.0–77.0)
Platelets: 237 10*3/uL (ref 150.0–400.0)
RBC: 5.3 Mil/uL (ref 4.22–5.81)
RDW: 13.9 % (ref 11.5–15.5)
WBC: 5.8 10*3/uL (ref 4.0–10.5)

## 2020-12-28 NOTE — Progress Notes (Signed)
   Subjective:    Patient ID: Garrett Bell, male    DOB: 1976/03/02, 45 y.o.   MRN: [id number]  HPI  Pt in for follow up.  No fever or chills. He sweated little bit last night.  Pt cbc, c reactive protein and lactic acid were all normal. Sed rate was elevated.   Pt thinks the area looks better and feels better. Little bit more swollen at end of the day.  Pt has compression sock that he uses on and off.  Pt started clindamycin on Tuesday. He had 5 doses already. He stopped doxycycline. He had no diarrhea.    Review of Systems  Constitutional: Negative for chills, fatigue and fever.  Respiratory: Negative for cough, chest tightness, shortness of breath and wheezing.   Cardiovascular: Negative for chest pain and palpitations.  Skin:       See physical exam  Psychiatric/Behavioral: Negative for behavioral problems, decreased concentration, dysphoric mood and hallucinations.       Objective:   Physical Exam   General- No acute distress. Pleasant patient. Neck- Full range of motion, no jvd Lungs- Clear, even and unlabored. Heart- regular rate and rhythm. Neurologic- CNII- XII grossly intact.  Rt lower ext- now pretibial ara about 8 cm x 3 cm width. Does not rap around to back of calf as it did before. Still hyperpigmentation from prior cellulitis posterior calf in past.  Rt thigh- prior area of redness no longer prominent. Not tender to palpation.  Rt thigh- one circular hyperpimented area distal thigh about 2.5 cm wide mild tender. No warm to touch. No induration.  Rt lower extremity- negative homans sign. Not swollen except for fait swelling in pretibial area that is red. No fluctuance over red area.      Assessment & Plan:  Improving appearance to rt lower extremity. Continue clindamycin antibiotic and probiotic.  Will get cbc and sed rate today. Sed rate mid elevated and will check make sure wbc not elevated as you mentioned some sweating last night. Prior lactic  acid and c reactive protein normal.  If area worsens/expands then ED evaluation.  Much improved. No popliteal pain. [place] not indicated.  Follow up Tuesday or Wednesday with pcp or myself. Sooner if needed  [employer], [employer]

## 2020-12-28 NOTE — Patient Instructions (Addendum)
Improving appearance to rt lower extremity. Continue clindamycin antibiotic and probiotic.  Will get cbc and sed rate today. Sed rate mid elevated and will check make sure wbc not elevated as you mentioned some sweating last night. Prior lactic acid and c reactive protein normal.  If area worsens/expands then ED evaluation.  Much improved. No popliteal pain. [place] not indicated.  Follow up Tuesday or Wednesday with pcp or myself. Sooner if needed

## 2021-01-03 ENCOUNTER — Ambulatory Visit (INDEPENDENT_AMBULATORY_CARE_PROVIDER_SITE_OTHER): Payer: Managed Care, Other (non HMO) | Admitting: Internal Medicine

## 2021-01-03 ENCOUNTER — Encounter: Payer: Self-pay | Admitting: Internal Medicine

## 2021-01-03 ENCOUNTER — Other Ambulatory Visit: Payer: Self-pay

## 2021-01-03 VITALS — BP 134/88 | HR 66 | Temp 98.1°F | Resp 16 | Ht 70.0 in | Wt 279.4 lb

## 2021-01-03 DIAGNOSIS — L03115 Cellulitis of right lower limb: Secondary | ICD-10-CM | POA: Diagnosis not present

## 2021-01-03 DIAGNOSIS — I10 Essential (primary) hypertension: Secondary | ICD-10-CM | POA: Diagnosis not present

## 2021-01-03 MED ORDER — CLINDAMYCIN HCL 300 MG PO CAPS: 300.0000 mg | ORAL_CAPSULE | Freq: Three times a day (TID) | ORAL | 0 refills | Status: DC

## 2021-01-03 NOTE — Patient Instructions (Addendum)
Continue antibiotics   See you in few days

## 2021-01-03 NOTE — Progress Notes (Signed)
Subjective:    Patient ID: BRANDELL MAREADY, male    DOB: 10-08-1975, 45 y.o.   MRN: [id number]  DOS:  01/03/2021 Type of visit - description: Acute  Seen 12/21/2020, 12/26/2020, 12/28/2020 for right leg cellulitis Chart reviewed. Initially on doxycycline, subsequently switched to clindamycin and got a Rocephin shot on May 21. Since then, he is improving. Denies fever chills.  HTN: I noticed his BP has improved, he is not taking hydralazine, has changed his diet, eating low-salt, no sodas  Obesity: Wonders about Trulicity.  Review of Systems See above   Past Medical History:  Diagnosis Date   Chest pain    neg stress test 01/2009   Diabetes (HCC)    A1C 6.4 (2012)   GERD (gastroesophageal reflux disease)    Headache(784.0)    HTN (hypertension)    Traumatic partial tear of right biceps tendon 06/2015    Past Surgical History:  Procedure Laterality Date   biceps surgery Right 07-04-15   tendon reattachment   CARPAL TUNNEL RELEASE Bilateral 06/11/2017   HEMORRHOID SURGERY  07-2008   thrombosed   VASECTOMY      Allergies as of 01/03/2021       Reactions   Hydrochlorothiazide Anaphylaxis   Sulfonamide Derivatives Anaphylaxis        Medication List        Accurate as of January 03, 2021 11:59 PM. If you have any questions, ask your nurse or doctor.          benazepril 40 MG tablet Commonly known as: LOTENSIN Take 1 tablet (40 mg total) by mouth daily.   clindamycin 300 MG capsule Commonly known as: Cleocin Take 1 capsule (300 mg total) by mouth 3 (three) times daily.   esomeprazole 40 MG capsule Commonly known as: NEXIUM Take 1 capsule (40 mg total) by mouth daily.   famotidine 20 MG tablet Commonly known as: PEPCID Take 1 tablet (20 mg total) by mouth 2 (two) times daily before lunch and supper.   metoprolol succinate 100 MG 24 hr tablet Commonly known as: Toprol XL Take 1 tablet (100 mg total) by mouth in the morning and at bedtime. Take with or  immediately following a meal.   Tadalafil 2.5 MG Tabs Take 1 tablet (2.5 mg total) by mouth daily.           Objective:   Physical Exam BP 134/88 (BP Location: Left Arm, Patient Position: Sitting, Cuff Size: Normal)   Pulse 66   Temp 98.1 F (36.7 C) (Oral)   Resp 16   Ht 5\' 10"  (1.778 m)   Wt 279 lb 6 oz (126.7 kg)   SpO2 97%   BMI 40.09 kg/m  General:   Well developed, NAD, BMI noted. HEENT:  Normocephalic . Face symmetric, atraumatic Neurologic:  alert & oriented X3.  Speech normal, gait appropriate for age and unassisted Skin: Redness has decreased, see picture.  Still slightly red at the calf the distal anterior pretibial area Psych--  Cognition and judgment appear intact.  Cooperative with normal attention span and concentration.  Behavior appropriate. No anxious or depressed appearing.        Assessment     Assessment Prediabetes dx- 2012, A1c 6.4 HTN - (HCTZ and sulfa) : anaphylaxis,  -Saw cardiology 08/10/2019: -Echo: Mild LVH, grade 1 diastolic dysfunction. 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 At this point he is doing better with clindamycin, will extend the treatment 5 days.  Call if improvement does not improve HTN: BP has been very good lately, has changed his diet and eating low-salt.  I think  the key for better BP control in addition to medicines is his lifestyle. He was Rx hydralazine but could not tolerate due to swelling. Plan: Cont Lotensin and metoprolol.  Will reassess on RTC Morbid obesity: In the past could not tolerate Ozempic but would like to  try Trulicity.  Will discuss on RTC RTC scheduled for 01/18/2021     This visit occurred during the SARS-CoV-2 public health emergency.  Safety protocols were in place, including screening questions prior to the visit, additional usage of staff PPE, and extensive cleaning of exam room while observing appropriate contact time as indicated for disinfecting solutions.

## 2021-01-04 NOTE — Assessment & Plan Note (Signed)
R leg cellulitis: He again developed a bout of cellulitis, it has been somewhat difficult to treat just as it was in October 2021. At this point he is doing better with clindamycin, will extend the treatment 5 days.  Call if improvement does not improve HTN: BP has been very good lately, has changed his diet and eating low-salt.  I think  the key for better BP control in addition to medicines is his lifestyle. He was Rx hydralazine but could not tolerate due to swelling. Plan: Cont Lotensin and metoprolol.  Will reassess on RTC Morbid obesity: In the past could not tolerate Ozempic but would like to try Trulicity.  Will discuss on RTC RTC scheduled for 01/18/2021

## 2021-01-18 ENCOUNTER — Ambulatory Visit (INDEPENDENT_AMBULATORY_CARE_PROVIDER_SITE_OTHER): Payer: Managed Care, Other (non HMO) | Admitting: Internal Medicine

## 2021-01-18 ENCOUNTER — Encounter: Payer: Self-pay | Admitting: Internal Medicine

## 2021-01-18 ENCOUNTER — Other Ambulatory Visit: Payer: Self-pay

## 2021-01-18 VITALS — BP 138/86 | HR 81 | Temp 98.0°F | Resp 16 | Ht 70.0 in | Wt 274.5 lb

## 2021-01-18 DIAGNOSIS — R7989 Other specified abnormal findings of blood chemistry: Secondary | ICD-10-CM | POA: Diagnosis not present

## 2021-01-18 DIAGNOSIS — I10 Essential (primary) hypertension: Secondary | ICD-10-CM

## 2021-01-18 DIAGNOSIS — R739 Hyperglycemia, unspecified: Secondary | ICD-10-CM

## 2021-01-18 LAB — BASIC METABOLIC PANEL
BUN: 19 mg/dL (ref 6–23)
CO2: 28 mEq/L (ref 19–32)
Calcium: 9.7 mg/dL (ref 8.4–10.5)
Chloride: 103 mEq/L (ref 96–112)
Creatinine, Ser: 0.86 mg/dL (ref 0.40–1.50)
GFR: 103.29 mL/min (ref >60.00)
Glucose, Bld: 94 mg/dL (ref 70–99)
Potassium: 4.2 mEq/L (ref 3.5–5.1)
Sodium: 137 mEq/L (ref 135–145)

## 2021-01-18 LAB — T4, FREE: Free T4: 0.85 ng/dL (ref 0.60–1.60)

## 2021-01-18 LAB — HEMOGLOBIN A1C: Hgb A1c MFr Bld: 6.4 % (ref 4.6–6.5)

## 2021-01-18 LAB — TSH: TSH: 4.96 u[IU]/mL — ABNORMAL HIGH (ref 0.35–4.50)

## 2021-01-18 NOTE — Patient Instructions (Addendum)
Check the  blood pressure BP GOAL is between 110/65 and  135/85. If it is consistently higher or lower, let me know   For the rash on your feet: Get an athletes foot cream  and hydrocortisone 1% both are over-the-counter and applying daily   GO TO THE LAB : Get the blood work     GO TO THE FRONT DESK, PLEASE SCHEDULE YOUR APPOINTMENTS Come back for   for a checkup in 4 months

## 2021-01-18 NOTE — Progress Notes (Signed)
Subjective:    Patient ID: Garrett Bell, male    DOB: 01/04/1976, 45 y.o.   MRN: [id number]  DOS:  01/18/2021 Type of visit - description: Routine visit  Today we talked about hyperglycemia, hypertension, leg cellulitis, morbid obesity, snoring. Since the last visit, he continued to do well with diet. Leg is essentially back to normal. He also complains of a itchy rash,  at the feet.   Wt Readings from Last 3 Encounters:  01/18/21 274 lb 8 oz (124.5 kg)  01/03/21 279 lb 6 oz (126.7 kg)  12/28/20 278 lb (126.1 kg)     Review of Systems Continued snoring, energy levels are okay. Denies any nausea, vomiting.  No constipation  Past Medical History:  Diagnosis Date   Chest pain    neg stress test 01/2009   Diabetes (HCC)    A1C 6.4 (2012)   GERD (gastroesophageal reflux disease)    Headache(784.0)    HTN (hypertension)    Traumatic partial tear of right biceps tendon 06/2015    Past Surgical History:  Procedure Laterality Date   biceps surgery Right 07-04-15   tendon reattachment   CARPAL TUNNEL RELEASE Bilateral 06/11/2017   HEMORRHOID SURGERY  07-2008   thrombosed   VASECTOMY      Allergies as of 01/18/2021       Reactions   Hydrochlorothiazide Anaphylaxis   Sulfonamide Derivatives Anaphylaxis        Medication List        Accurate as of January 18, 2021  8:03 AM. If you have any questions, ask your nurse or doctor.          benazepril 40 MG tablet Commonly known as: LOTENSIN Take 1 tablet (40 mg total) by mouth daily.   clindamycin 300 MG capsule Commonly known as: Cleocin Take 1 capsule (300 mg total) by mouth 3 (three) times daily.   esomeprazole 40 MG capsule Commonly known as: NEXIUM Take 1 capsule (40 mg total) by mouth daily.   famotidine 20 MG tablet Commonly known as: PEPCID Take 1 tablet (20 mg total) by mouth 2 (two) times daily before lunch and supper.   metoprolol succinate 100 MG 24 hr tablet Commonly known as: Toprol XL Take  1 tablet (100 mg total) by mouth in the morning and at bedtime. Take with or immediately following a meal.   Tadalafil 2.5 MG Tabs Take 1 tablet (2.5 mg total) by mouth daily.           Objective:   Physical Exam BP 138/86 (BP Location: Left Arm, Patient Position: Sitting, Cuff Size: Normal)   Pulse 81   Temp 98 F (36.7 C) (Oral)   Resp 16   Ht 5\' 10"  (1.778 m)   Wt 274 lb 8 oz (124.5 kg)   SpO2 98%   BMI 39.39 kg/m  General:   Well developed, NAD, BMI noted. HEENT:  Normocephalic . Face symmetric, atraumatic Lungs:  CTA B Normal respiratory effort, no intercostal retractions, no accessory muscle use. Heart: RRR,  no murmur.  Lower extremities: no pretibial edema bilaterally  Skin: Dry skin of the feet particularly at the plantar aspect near the fourth and fifth toe.  No maceration. Neurologic:  alert & oriented X3.  Speech normal, gait appropriate for age and unassisted Psych--  Cognition and judgment appear intact.  Cooperative with normal attention span and concentration.  Behavior appropriate. No anxious or depressed appearing.      Assessment     Assessment  Prediabetes dx- 2012, A1c 6.4 HTN - (HCTZ and sulfa) : anaphylaxis,  -Saw cardiology 08/10/2019: -Echo: Mild LVH, grade 1 diastolic dysfunction. -Vascular ultrasound: Mild bilateral stenosis of renal artery.  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  Continue Lotensin, metoprolol, check a BMP. Morbid obesity: Continue to do well, diet improved, has lost 3 pounds.  He is a still interested on medications, we are checking labs and subsequently if appropriate will  prescribe Trulicity. Snoring: Cardiology recommended a sleep study, that is pending Hyperlipidemia: diet controlled,reassess in few months Increased TSH: Check TSH, T4 anti-TPO RTC 4 months   This visit occurred during the SARS-CoV-2 public health emergency.  Safety protocols were in place, including screening questions prior to the visit, additional usage of staff PPE, and extensive cleaning of exam room while observing appropriate contact time as indicated for disinfecting solutions.

## 2021-01-18 NOTE — Assessment & Plan Note (Signed)
Hyperglycemia: check a a1c, doing well w/  diet R leg cellulitis: Resolved HTN: BP today is acceptable, at home is reportedly within normal.  Continue Lotensin, metoprolol, check a BMP. Morbid obesity: Continue to do well, diet improved, has lost 3 pounds.  He is a still interested on medications, we are checking labs and subsequently if appropriate will prescribe Trulicity. Snoring: Cardiology recommended a sleep study, that is pending Hyperlipidemia: diet controlled,reassess in few months Increased TSH: Check TSH, T4 anti-TPO RTC 4 months

## 2021-01-19 LAB — THYROID PEROXIDASE ANTIBODIES (TPO) (REFL): Thyroperoxidase Ab SerPl-aCnc: 1 IU/mL (ref <9)

## 2021-02-10 IMAGING — CT CT HEAD W/O CM
3 series · 16 of 47 positions shown, 19 images · non-contrast
Comparison: 01/08/2019

CLINICAL DATA: Headache since 6 a.m.  History of hypertension.

EXAM:
CT HEAD WITHOUT CONTRAST
TECHNIQUE: Contiguous axial images were obtained from the base of the skull
through the vertex without intravenous contrast.

[Series 2: head wo · axial · 0.48mm/px · z∈[+842,+992]mm · 10 of 36 slices shown, 13 images]
[im 3/36  brain]
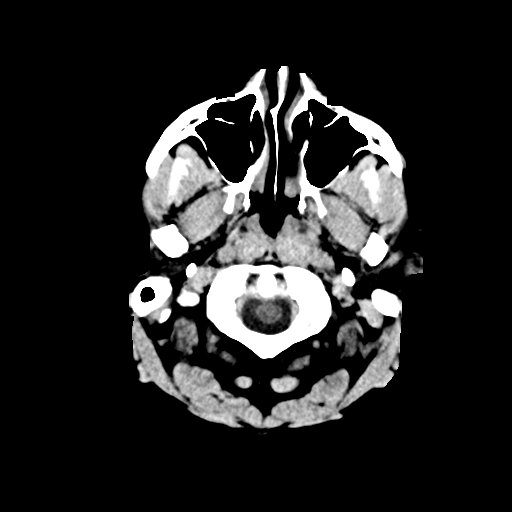
[im 3/36  bone]
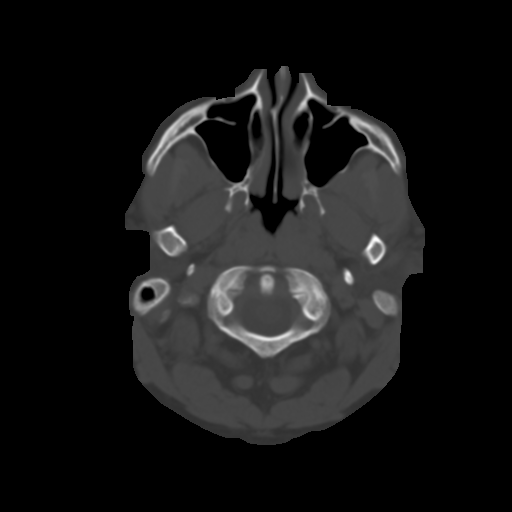
[im 7/36  brain]
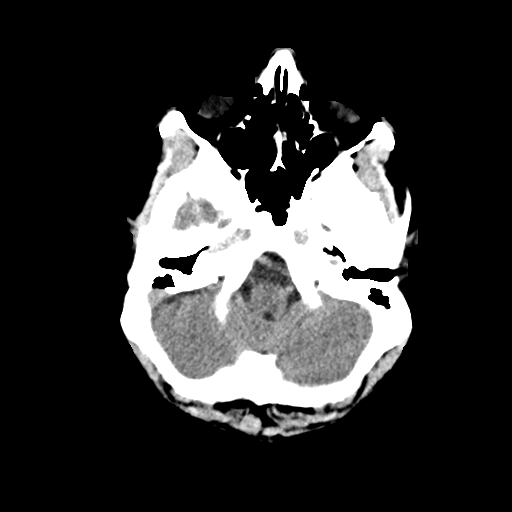
[im 10/36  brain]
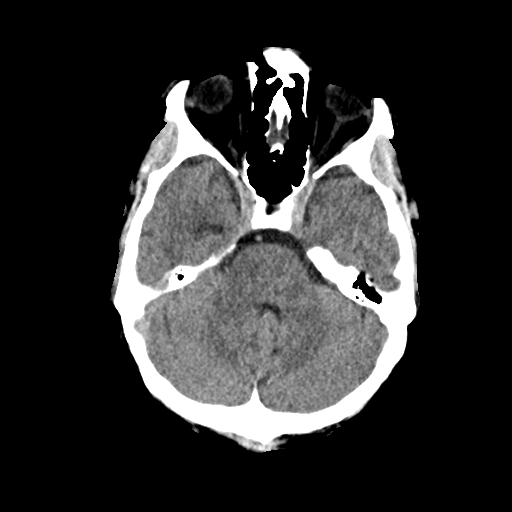
[im 13/36  brain]
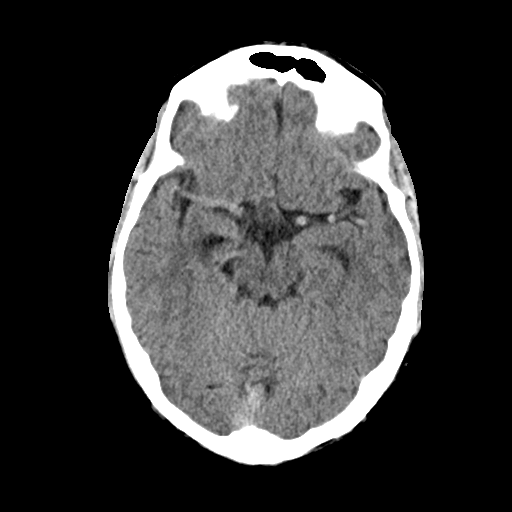
[im 16/36  brain]
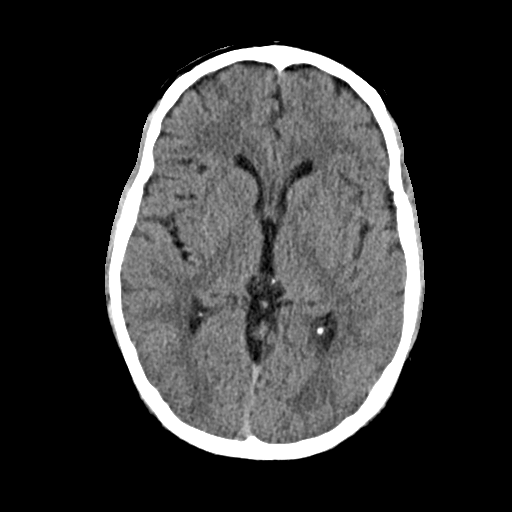
[im 16/36  bone]
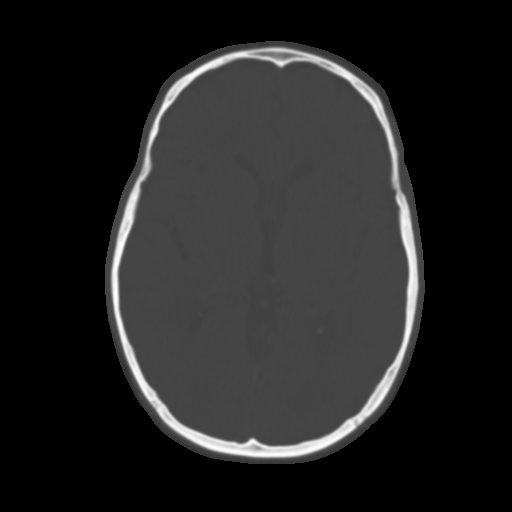
[im 20/36  brain]
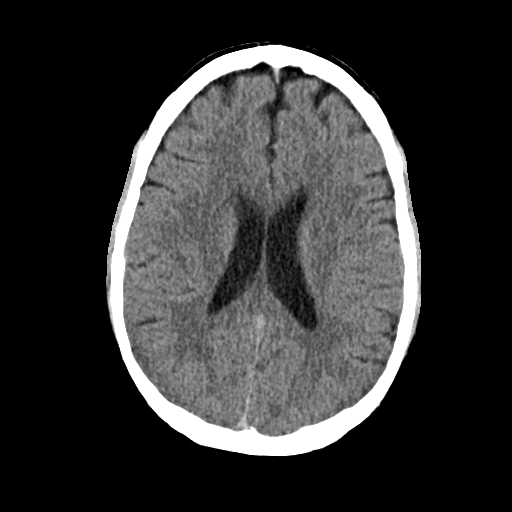
[im 23/36  brain]
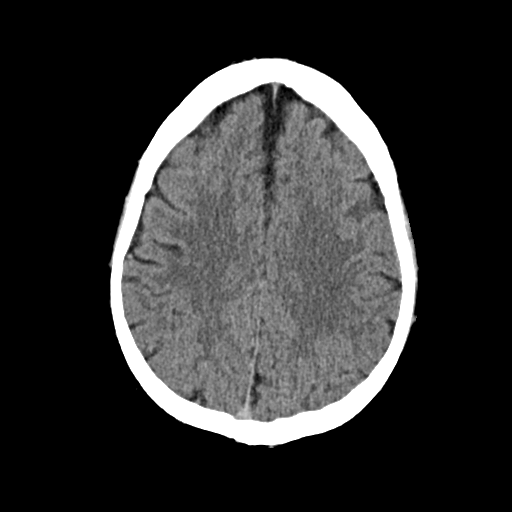
[im 27/36  brain]
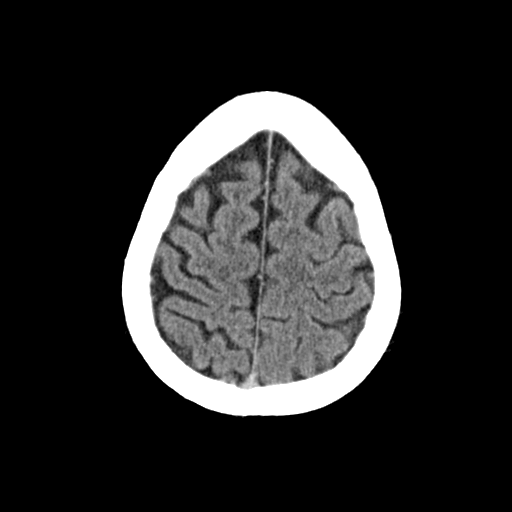
[im 29/36  brain]
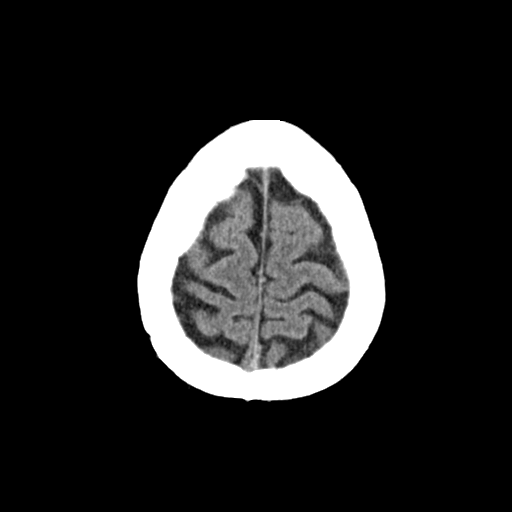
[im 29/36  bone]
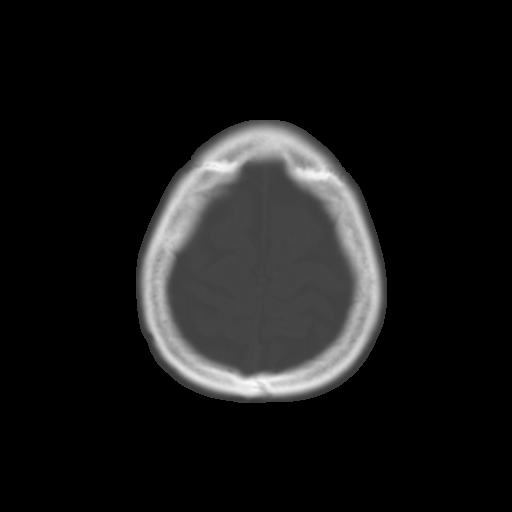
[im 33/36  brain]
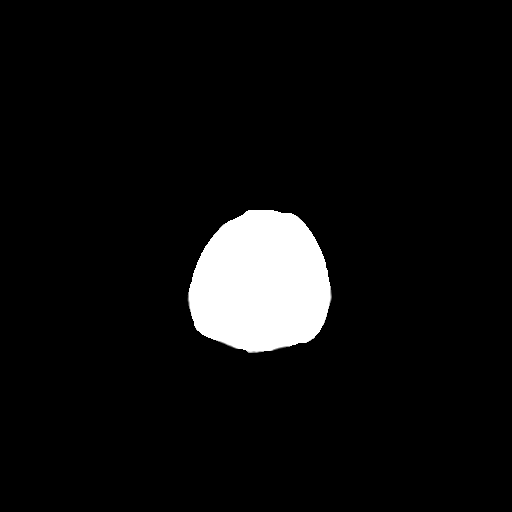

[Series 4: coronal soft · coronal · 0.36mm/px · 3 of 74 slices shown]
[im 25/74  brain]
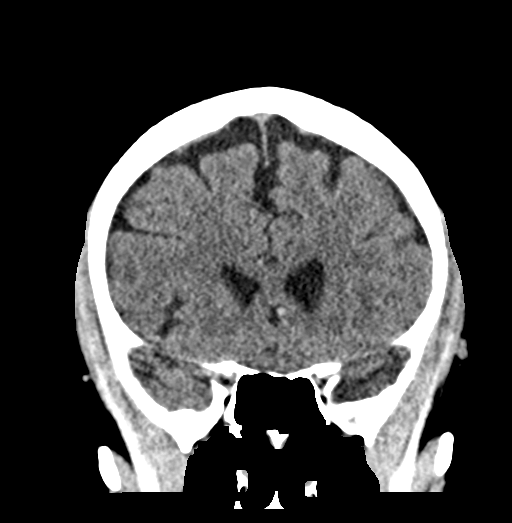
[im 33/74  brain]
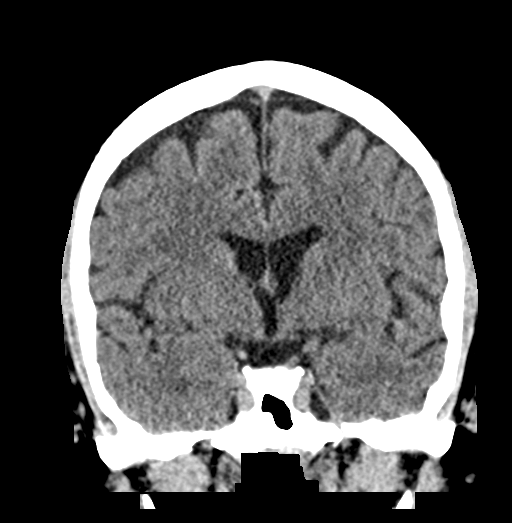
[im 41/74  brain]
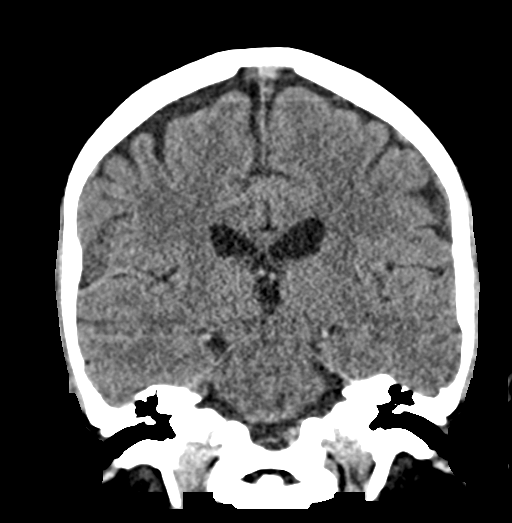

[Series 5: sag soft · sagittal · 0.37mm/px · 3 of 59 slices shown]
[im 20/59  brain]
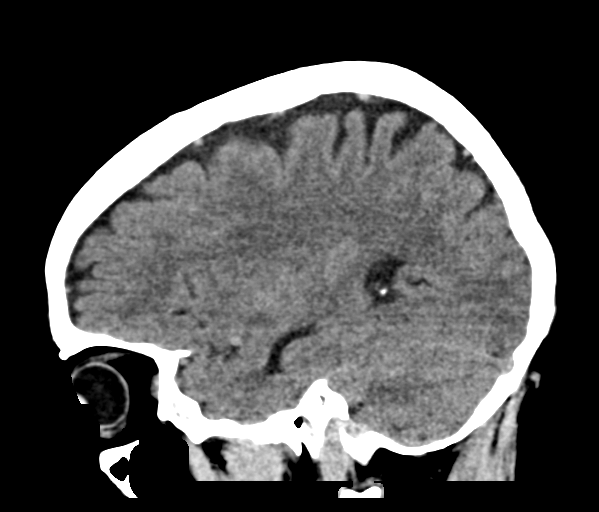
[im 30/59  brain]
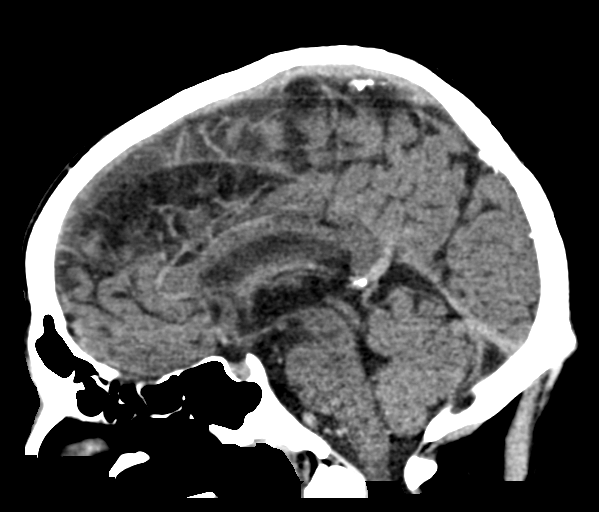
[im 39/59  brain]
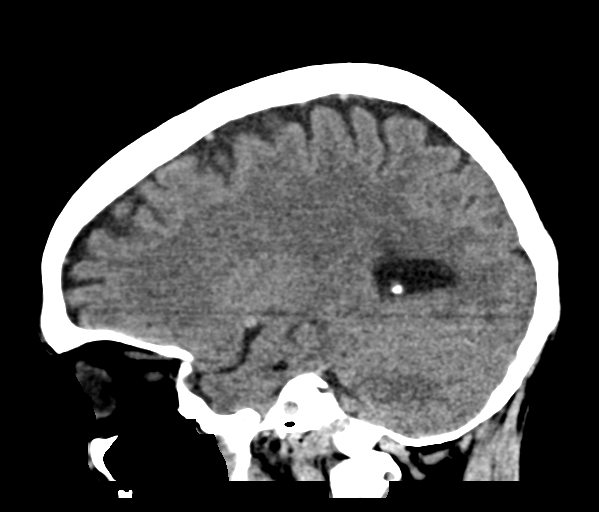

[16 of 47 positions shown; findings below may reference images not displayed]

FINDINGS: Brain: No evidence of acute infarction, hemorrhage, hydrocephalus,
extra-axial collection or mass lesion/mass effect.

Vascular: No hyperdense vessel or unexpected calcification.

Skull: Normal. Negative for fracture or focal lesion.

Sinuses/Orbits: Globes and orbits are unremarkable. Visualized
sinuses and mastoid air cells are clear.

Other: None.
IMPRESSION: Normal enhanced CT scan of the brain

## 2021-03-05 ENCOUNTER — Other Ambulatory Visit: Payer: Self-pay | Admitting: Internal Medicine

## 2021-03-10 ENCOUNTER — Other Ambulatory Visit: Payer: Self-pay | Admitting: Internal Medicine

## 2021-04-05 ENCOUNTER — Ambulatory Visit (INDEPENDENT_AMBULATORY_CARE_PROVIDER_SITE_OTHER): Payer: Managed Care, Other (non HMO) | Admitting: Medical

## 2021-04-05 ENCOUNTER — Other Ambulatory Visit: Payer: Self-pay

## 2021-04-05 VITALS — BP 150/90 | HR 73 | Resp 18 | Ht 70.0 in | Wt 259.0 lb

## 2021-04-05 DIAGNOSIS — L089 Local infection of the skin and subcutaneous tissue, unspecified: Secondary | ICD-10-CM

## 2021-04-05 DIAGNOSIS — L03115 Cellulitis of right lower limb: Secondary | ICD-10-CM

## 2021-04-05 MED ORDER — NYSTATIN 100000 UNIT/GM EX CREA: 1.0000 "application " | TOPICAL_CREAM | Freq: Two times a day (BID) | CUTANEOUS | 0 refills | Status: DC

## 2021-04-05 NOTE — Progress Notes (Addendum)
Subjective:    Patient ID: Garrett Bell, male    DOB: Aug 01, 1975, 45 y.o.   MRN: [id number]  HPI   Pt in for some bilateral redness and tenderness to both medial thigh and groin area. Pt states area was itching first for 2 days. Then noticed some red spots. Overall 5 days into this.   Pt went to UC yesterday.  Pt was given doxycycline antibiotic and given injection antibiotic. Rocephin 1 gram.  Pt state hurts to walk.  No fever, no chills or sweats.  Pt had prior cellultis of lower ext in the past.  Pt has borderline elevated sugar in past. Since last visit he has cut back on carbs and sodas. More vegetables. Lost 25 lb purposeful.    Early on had testicle itching but no presently.    Review of Systems  Constitutional:  Negative for chills, fatigue and fever.  HENT:  Negative for congestion and drooling.   Respiratory:  Negative for cough, chest tightness, shortness of breath and wheezing.   Cardiovascular:  Negative for chest pain and palpitations.  Gastrointestinal:  Negative for abdominal pain.  Genitourinary:  Negative for difficulty urinating, enuresis, genital sores and hematuria.  Musculoskeletal:  Negative for back pain.  Skin:  Positive for rash.       See hpi.   Past Medical History:  Diagnosis Date   Chest pain    neg stress test 01/2009   Diabetes (HCC)    A1C 6.4 (2012)   GERD (gastroesophageal reflux disease)    Headache(784.0)    HTN (hypertension)    Traumatic partial tear of right biceps tendon 06/2015     Social History   Socioeconomic History   Marital status: Married    Spouse name: Not on file   Number of children: 5   Years of education: Not on file   Highest education level: Not on file  Occupational History   Occupation: Conservator, museum/gallery   Tobacco Use   Smoking status: Never   Smokeless tobacco: Never  Vaping Use   Vaping Use: Never used  Substance and Sexual Activity   Alcohol use: No   Drug use: No   Sexual activity:  Yes  Other Topics Concern   Not on file  Social History Narrative   divorced 2011, 5 children    household is pt, 3 of the children and wife (remarried)   Social Determinants of Corporate investment banker Strain: Not on file  Food Insecurity: Not on file  Transportation Needs: Not on file  Physical Activity: Not on file  Stress: Not on file  Social Connections: Not on file  Intimate Partner Violence: Not on file    Past Surgical History:  Procedure Laterality Date   biceps surgery Right 07-04-15   tendon reattachment   CARPAL TUNNEL RELEASE Bilateral 06/11/2017   HEMORRHOID SURGERY  07-2008   thrombosed   VASECTOMY      Family History  Problem Relation Age of Onset   Hypertension Father    Stroke Other        GF   Aneurysm Other        GM, brain   CAD Maternal Grandmother    Colon cancer Neg Hx    Prostate cancer Neg Hx     Allergies  Allergen Reactions   Hydrochlorothiazide Anaphylaxis   Sulfonamide Derivatives Anaphylaxis    Current Outpatient Medications on File Prior to Visit  Medication Sig Dispense Refill   benazepril (LOTENSIN)  40 MG tablet Take 1 tablet (40 mg total) by mouth daily. 90 tablet 1   esomeprazole (NEXIUM) 40 MG capsule Take 1 capsule (40 mg total) by mouth daily. 30 capsule 0   famotidine (PEPCID) 20 MG tablet Take 1 tablet (20 mg total) by mouth 2 (two) times daily before lunch and supper. 180 tablet 3   metoprolol succinate (TOPROL XL) 100 MG 24 hr tablet Take 1 tablet (100 mg total) by mouth in the morning and at bedtime. Take with or immediately following a meal. 180 tablet 1   Tadalafil 2.5 MG TABS TAKE ONE TABLET BY MOUTH DAILY 30 tablet 6   No current facility-administered medications on file prior to visit.    BP (!) 150/90   Pulse 73   Resp 18   Ht 5\' 10"  (1.778 m)   Wt 259 lb (117.5 kg)   SpO2 97%   BMI 37.16 kg/m        Objective:   Physical Exam   General- No acute distress. Pleasant patient. Neck- Full range of  motion, no jvd Lungs- Clear, even and unlabored. Heart- regular rate and rhythm. Neurologic- CNII- XII grossly intact.  Skin- mild faint bilateral pinkish red rash to bother upper medial aspect of thighs. Not real warmth to touch or tender. No induration or fluctuance.(Wound culture taken rubbing swab against skin both medial thighs but no dc present)    Assessment & Plan:   Patient Instructions  Bilateral upper thigh skin infection/forming cellulitis with some folliculitis. Abscess could be in process of forming. If you see abscess type presentation with severe pain then may need incision and drainage thru ED.   Early in treatment of doxycyline. If worsening or changing let know.  [place] If area worsens or changes then would need to see local UC or ED.  Can update [place] tomorrow morning before heading [place].  Follow up with [place] Sept 19, 2022 or be seen sooner [place] if needed.   05-07-1998, PA-C

## 2021-04-05 NOTE — Patient Instructions (Addendum)
Bilateral upper thigh skin infection/forming cellulitis with some folliculitis. Abscess could be in process of forming. If you see abscess type presentation with severe pain then may need incision and drainage thru ED.   Early in treatment of doxycyline. If worsening or changing let us know.  [place]. If area worsens or changes then would need to see local UC or ED.  Can update [place] tomorrow morning before heading [place].  Follow up with [place] Sept 19, 2022 or be seen sooner [place] if needed.  Making nystatin available for groin redness or scrotal redness. Fungal component possible.

## 2021-04-08 LAB — WOUND CULTURE
MICRO NUMBER:: 12347355
SPECIMEN QUALITY:: ADEQUATE

## 2021-04-14 ENCOUNTER — Other Ambulatory Visit: Payer: Self-pay | Admitting: Internal Medicine

## 2021-04-19 ENCOUNTER — Ambulatory Visit: Payer: Managed Care, Other (non HMO) | Admitting: Medical

## 2021-05-06 ENCOUNTER — Encounter: Payer: Self-pay | Admitting: Internal Medicine

## 2021-05-08 ENCOUNTER — Encounter: Payer: Self-pay | Admitting: Internal Medicine

## 2021-05-08 ENCOUNTER — Other Ambulatory Visit: Payer: Self-pay

## 2021-05-08 ENCOUNTER — Ambulatory Visit (INDEPENDENT_AMBULATORY_CARE_PROVIDER_SITE_OTHER): Payer: Managed Care, Other (non HMO) | Admitting: Internal Medicine

## 2021-05-08 VITALS — BP 136/90 | HR 59 | Temp 97.9°F | Resp 16 | Ht 70.0 in | Wt 258.5 lb

## 2021-05-08 DIAGNOSIS — K649 Unspecified hemorrhoids: Secondary | ICD-10-CM | POA: Insufficient documentation

## 2021-05-08 DIAGNOSIS — K641 Second degree hemorrhoids: Secondary | ICD-10-CM

## 2021-05-08 MED ORDER — HYDROCORTISONE ACETATE 25 MG RE SUPP: 25.0000 mg | Freq: Two times a day (BID) | RECTAL | 1 refills | Status: DC | PRN

## 2021-05-08 NOTE — Assessment & Plan Note (Signed)
External & internal hemorrhoid: As described above, does not seem to be infected or thrombosed. No previous colonoscopy but he recalls that years ago he had a hemorrhoid,   apparently it was lancet (thrombosed?)  Plan: Increase fluids, fiber rich diet, Anusol suppositories, call if not gradually better. Will discuss a colonoscopy at his next physical exam

## 2021-05-08 NOTE — Progress Notes (Signed)
Subjective:    Patient ID: Garrett Bell, male    DOB: [birth year], 45 y.o.   MRN: [id number]  DOS:  05/08/2021 Type of visit - description: Acute  Chief complaint today is hemorrhoids Noted a lump at the anus a week ago. Denies any bleeding or itching,area is somewhat uncomfortable.  Other than that he denies fever chills No nausea or vomiting No diarrhea or constipation.  Review of Systems See above   Past Medical History:  Diagnosis Date   Chest pain    neg stress test 01/2009   Diabetes (HCC)    A1C 6.4 (2012)   GERD (gastroesophageal reflux disease)    Headache(784.0)    HTN (hypertension)    Traumatic partial tear of right biceps tendon 06/2015    Past Surgical History:  Procedure Laterality Date   biceps surgery Right 07-04-15   tendon reattachment   CARPAL TUNNEL RELEASE Bilateral 06/11/2017   HEMORRHOID SURGERY  07-2008   thrombosed   VASECTOMY      Allergies as of 05/08/2021       Reactions   Hydrochlorothiazide Anaphylaxis   Sulfonamide Derivatives Anaphylaxis        Medication List        Accurate as of May 08, 2021 11:26 AM. If you have any questions, ask your nurse or doctor.          benazepril 40 MG tablet Commonly known as: LOTENSIN Take 1 tablet (40 mg total) by mouth daily.   esomeprazole 40 MG capsule Commonly known as: NEXIUM Take 1 capsule (40 mg total) by mouth daily.   famotidine 20 MG tablet Commonly known as: PEPCID Take 1 tablet (20 mg total) by mouth 2 (two) times daily before lunch and supper.   metoprolol succinate 100 MG 24 hr tablet Commonly known as: TOPROL-XL TAKE ONE TABLET BY MOUTH EVERY MORNING AND AT BEDTIME TAKE WITH OR IMMEDIATELY FOLLOWING A MEAL   nystatin cream Commonly known as: MYCOSTATIN Apply 1 application topically 2 (two) times daily.   Tadalafil 2.5 MG Tabs TAKE ONE TABLET BY MOUTH DAILY           Objective:   Physical Exam BP 136/90 (BP Location: Left Arm, Patient Position:  Sitting, Cuff Size: Normal)   Pulse (!) 59   Temp 97.9 F (36.6 C) (Oral)   Resp 16   Ht 5\' 10"  (1.778 m)   Wt 258 lb 8 oz (117.3 kg)   SpO2 96%   BMI 37.09 kg/m  General:   Well developed, NAD, BMI noted. HEENT:  Normocephalic . Face symmetric, atraumatic DRE: External examination: Has a approximately 2 x 1 cm hemorrhoid, soft, not tender or warm. Anoscopy: Multiple small internal hemorrhoids, no polyps noted. Lower extremities: no pretibial edema bilaterally  Skin: Not pale. Not jaundice Neurologic:  alert & oriented X3.  Speech normal, gait appropriate for age and unassisted Psych--  Cognition and judgment appear intact.  Cooperative with normal attention span and concentration.  Behavior appropriate. No anxious or depressed appearing.      Assessment    Assessment Prediabetes dx- 2012, A1c 6.4 HTN - (HCTZ and sulfa) : anaphylaxis,  -Saw cardiology 08/10/2019: -Echo: Mild LVH, grade 1 diastolic dysfunction. 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 No previous colonoscopy but he recalls that years ago he had a hemorrhoid,   apparently it was lancet (thrombosed?)  Plan: Increase fluids, fiber rich diet, Anusol suppositories, call if not gradually better. Will discuss a colonoscopy at his next physical exam   This visit occurred during the SARS-CoV-2 public health emergency.  Safety protocols were in place, including screening questions prior to the visit, additional usage of staff PPE, and extensive cleaning of exam room while observing appropriate contact time as indicated for disinfecting solutions.

## 2021-05-08 NOTE — Patient Instructions (Signed)
Drink plenty of fluids  You need a high-fiber diet: Fruits, vegetables. You could also take Metamucil supplements  You can use over-the-counter hemorrhoid cream or use the suppositories as needed.  Call if not gradually better  Hemorrhoids Hemorrhoids are swollen veins that may develop: In the butt (rectum). These are called internal hemorrhoids. Around the opening of the butt (anus). These are called external hemorrhoids. Hemorrhoids can cause pain, itching, or bleeding. Most of the time, they do not cause serious problems. They usually get better with diet changes, lifestyle changes, and other home treatments. What are the causes? This condition may be caused by: Having trouble pooping (constipation). Pushing hard (straining) to poop. Watery poop (diarrhea). Pregnancy. Being very overweight (obese). Sitting for long periods of time. Heavy lifting or other activity that causes you to strain. Anal sex. Riding a bike for a long period of time. What are the signs or symptoms? Symptoms of this condition include: Pain. Itching or soreness in the butt. Bleeding from the butt. Leaking poop. Swelling in the area. One or more lumps around the opening of your butt. How is this diagnosed? A doctor can often diagnose this condition by looking at the affected area. The doctor may also: Do an exam that involves feeling the area with a gloved hand (digital rectal exam). Examine the area inside your butt using a small tube (anoscope). Order blood tests. This may be done if you have lost a lot of blood. Have you get a test that involves looking inside the colon using a flexible tube with a camera on the end (sigmoidoscopy or colonoscopy). How is this treated? This condition can usually be treated at home. Your doctor may tell you to change what you eat, make lifestyle changes, or try home treatments. If these do not help, procedures can be done to remove the hemorrhoids or make them smaller.  These may involve: Placing rubber bands at the base of the hemorrhoids to cut off their blood supply. Injecting medicine into the hemorrhoids to shrink them. Shining a type of light energy onto the hemorrhoids to cause them to fall off. Doing surgery to remove the hemorrhoids or cut off their blood supply. Follow these instructions at home: Eating and drinking  Eat foods that have a lot of fiber in them. These include whole grains, beans, nuts, fruits, and vegetables. Ask your doctor about taking products that have added fiber (fibersupplements). Reduce the amount of fat in your diet. You can do this by: Eating low-fat dairy products. Eating less red meat. Avoiding processed foods. Drink enough fluid to keep your pee (urine) pale yellow. Managing pain and swelling  Take a warm-water bath (sitz bath) for 20 minutes to ease pain. Do this 3-4 times a day. You may do this in a bathtub or using a portable sitz bath that fits over the toilet. If told, put ice on the painful area. It may be helpful to use ice between your warm baths. Put ice in a plastic bag. Place a towel between your skin and the bag. Leave the ice on for 20 minutes, 2-3 times a day. General instructions Take over-the-counter and prescription medicines only as told by your doctor. Medicated creams and medicines may be used as told. Exercise often. Ask your doctor how much and what kind of exercise is best for you. Go to the bathroom when you have the urge to poop. Do not wait. Avoid pushing too hard when you poop. Keep your butt dry and clean. Use  wet toilet paper or moist towelettes after pooping. Do not sit on the toilet for a long time. Keep all follow-up visits as told by your doctor. This is important. Contact a doctor if you: Have pain and swelling that do not get better with treatment or medicine. Have trouble pooping. Cannot poop. Have pain or swelling outside the area of the hemorrhoids. Get help right away if  you have: Bleeding that will not stop. Summary Hemorrhoids are swollen veins in the butt or around the opening of the butt. They can cause pain, itching, or bleeding. Eat foods that have a lot of fiber in them. These include whole grains, beans, nuts, fruits, and vegetables. Take a warm-water bath (sitz bath) for 20 minutes to ease pain. Do this 3-4 times a day. This information is not intended to replace advice given to you by your health care provider. Make sure you discuss any questions you have with your health care provider. Document Revised: 07/23/2018 Document Reviewed: 12/04/2017 Elsevier Patient Education  2022 [employer].

## 2021-05-17 ENCOUNTER — Ambulatory Visit: Payer: Managed Care, Other (non HMO) | Admitting: Internal Medicine

## 2021-05-24 ENCOUNTER — Ambulatory Visit: Payer: Managed Care, Other (non HMO) | Admitting: Internal Medicine

## 2021-05-31 ENCOUNTER — Other Ambulatory Visit: Payer: Self-pay

## 2021-05-31 ENCOUNTER — Ambulatory Visit (INDEPENDENT_AMBULATORY_CARE_PROVIDER_SITE_OTHER): Payer: Managed Care, Other (non HMO) | Admitting: Internal Medicine

## 2021-05-31 VITALS — BP 118/80 | HR 74 | Temp 98.2°F | Resp 18 | Ht 70.0 in | Wt 257.0 lb

## 2021-05-31 DIAGNOSIS — R739 Hyperglycemia, unspecified: Secondary | ICD-10-CM

## 2021-05-31 DIAGNOSIS — R0683 Snoring: Secondary | ICD-10-CM | POA: Diagnosis not present

## 2021-05-31 DIAGNOSIS — R7989 Other specified abnormal findings of blood chemistry: Secondary | ICD-10-CM

## 2021-05-31 NOTE — Progress Notes (Signed)
Subjective:    Patient ID: Garrett Bell, male    DOB: 10-Mar-1976, 45 y.o.   MRN: [id number]  DOS:  05/31/2021 Type of visit - description: f/u  We discussed several issues. Hemorrhoids: Denies any bleeding, pain or discharge. Morbid obesity: Doing better, has lost some weight. Went to urgent care several days ago, had swelling and discharge from the right groin, was Rx antibiotics, much improved. Denies fever chills. We also talk about vaccinations.  Wt Readings from Last 3 Encounters:  05/31/21 257 lb (116.6 kg)  05/08/21 258 lb 8 oz (117.3 kg)  04/05/21 259 lb (117.5 kg)     Review of Systems See above   Past Medical History:  Diagnosis Date   Chest pain    neg stress test 01/2009   Diabetes (HCC)    A1C 6.4 (2012)   GERD (gastroesophageal reflux disease)    Headache(784.0)    HTN (hypertension)    Traumatic partial tear of right biceps tendon 06/2015    Past Surgical History:  Procedure Laterality Date   biceps surgery Right 07-04-15   tendon reattachment   CARPAL TUNNEL RELEASE Bilateral 06/11/2017   HEMORRHOID SURGERY  07-2008   thrombosed   VASECTOMY      Allergies as of 05/31/2021       Reactions   Hydrochlorothiazide Anaphylaxis   Sulfonamide Derivatives Anaphylaxis        Medication List        Accurate as of May 31, 2021  1:36 PM. If you have any questions, ask your nurse or doctor.          benazepril 40 MG tablet Commonly known as: LOTENSIN Take 1 tablet (40 mg total) by mouth daily.   esomeprazole 40 MG capsule Commonly known as: NEXIUM Take 1 capsule (40 mg total) by mouth daily.   famotidine 20 MG tablet Commonly known as: PEPCID Take 1 tablet (20 mg total) by mouth 2 (two) times daily before lunch and supper.   hydrocortisone 25 MG suppository Commonly known as: ANUSOL-HC Place 1 suppository (25 mg total) rectally 2 (two) times daily as needed for hemorrhoids.   metoprolol succinate 100 MG 24 hr tablet Commonly  known as: TOPROL-XL TAKE ONE TABLET BY MOUTH EVERY MORNING AND AT BEDTIME TAKE WITH OR IMMEDIATELY FOLLOWING A MEAL   nystatin cream Commonly known as: MYCOSTATIN Apply 1 application topically 2 (two) times daily.   Tadalafil 2.5 MG Tabs TAKE ONE TABLET BY MOUTH DAILY           Objective:   Physical Exam BP 118/80   Pulse 74   Temp 98.2 F (36.8 C)   Resp 18   Ht 5\' 10"  (1.778 m)   Wt 257 lb (116.6 kg)   SpO2 98%   BMI 36.88 kg/m  General:   Well developed, NAD, BMI noted. HEENT:  Normocephalic . Face symmetric, atraumatic Lungs:  CTA B Normal respiratory effort, no intercostal retractions, no accessory muscle use. Heart: RRR,  no murmur.  Lower extremities: no pretibial edema bilaterally  Skin: At the  R groin has a small opening, small amount of a sebaceous material was found, no discharge, no bleeding, no fluctuance Neurologic:  alert & oriented X3.  Speech normal, gait appropriate for age and unassisted Psych--  Cognition and judgment appear intact.  Cooperative with normal attention span and concentration.  Behavior appropriate. No anxious or depressed appearing.      Assessment      Assessment Prediabetes dx- 2012,  A1c 6.4 HTN - (HCTZ and sulfa) : anaphylaxis,  -Saw cardiology 08/10/2019: -Echo: Mild LVH, grade 1 diastolic dysfunction. -Vascular ultrasound: Mild bilateral stenosis of renal artery.  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 HTN: Blood pressure is great, reports good ambulatory BPs.  Continue metoprolol, Lotensin. Hemorrhoids: Still able to feel the hemorrhoids but has no symptoms. Increased TSH: Labs GERD: Only on PPIs, not requiring H2 blockers  (probably due to better diet). Morbid obesity: Making progress! Changing his lifestyle  Snoring: Was recommended a sleep study, declined, prefers to work on weight loss Skin infection:  went to UC, had an infection at the R groin, better on doxycycline, see physical exam. Preventive care: Recommend flu shot and a COVID booster. RTC CPX 4 months    This visit occurred during the SARS-CoV-2 public health emergency.  Safety protocols were in place, including screening questions prior to the visit, additional usage of staff PPE, and extensive cleaning of exam room while observing appropriate contact time as indicated for disinfecting solutions.

## 2021-05-31 NOTE — Patient Instructions (Addendum)
Vaccinations are recommended: Flu shot COVID booster   GO TO THE LAB : Get the blood work     GO TO THE FRONT DESK, PLEASE SCHEDULE YOUR APPOINTMENTS Come back for   a physical exam in approximately 4 months

## 2021-06-01 LAB — T4, FREE: Free T4: 0.88 ng/dL (ref 0.60–1.60)

## 2021-06-01 LAB — TSH: TSH: 3.93 u[IU]/mL (ref 0.35–5.50)

## 2021-06-01 LAB — T3, FREE: T3, Free: 3.5 pg/mL (ref 2.3–4.2)

## 2021-06-01 LAB — HEMOGLOBIN A1C: Hgb A1c MFr Bld: 6.1 % (ref 4.6–6.5)

## 2021-06-01 NOTE — Assessment & Plan Note (Signed)
Prediabetes: Doing better with diet, small amount of weight loss, praised, check A1c. HTN: Blood pressure is great, reports good ambulatory BPs.  Continue metoprolol, Lotensin. Hemorrhoids: Still able to feel the hemorrhoids but has no symptoms. Increased TSH: Labs GERD: Only on PPIs, not requiring H2 blockers (probably due to better diet). Morbid obesity: Making progress! Changing his lifestyle  Snoring: Was recommended a sleep study, declined, prefers to work on weight loss Skin infection:  went to UC, had an infection at the R groin, better on doxycycline, see physical exam. Preventive care: Recommend flu shot and a COVID booster. RTC CPX 4 months

## 2021-07-24 ENCOUNTER — Emergency Department (HOSPITAL_COMMUNITY)
Admission: EM | Admit: 2021-07-24 | Discharge: 2021-07-25 | Disposition: A | Discharge status: Home / Self Care | Payer: Managed Care, Other (non HMO) | Attending: Emergency Medicine | Admitting: Emergency Medicine

## 2021-07-24 ENCOUNTER — Encounter (HOSPITAL_COMMUNITY): Payer: Self-pay | Admitting: Emergency Medicine

## 2021-07-24 ENCOUNTER — Telehealth: Payer: Self-pay | Admitting: Internal Medicine

## 2021-07-24 ENCOUNTER — Ambulatory Visit (INDEPENDENT_AMBULATORY_CARE_PROVIDER_SITE_OTHER): Payer: Managed Care, Other (non HMO) | Admitting: Family

## 2021-07-24 ENCOUNTER — Encounter: Payer: Self-pay | Admitting: Family

## 2021-07-24 ENCOUNTER — Emergency Department (HOSPITAL_COMMUNITY): Payer: Managed Care, Other (non HMO)

## 2021-07-24 VITALS — BP 150/100 | HR 88 | Temp 99.7°F | Ht 70.0 in | Wt 253.8 lb

## 2021-07-24 DIAGNOSIS — L0291 Cutaneous abscess, unspecified: Secondary | ICD-10-CM | POA: Diagnosis not present

## 2021-07-24 DIAGNOSIS — I10 Essential (primary) hypertension: Secondary | ICD-10-CM | POA: Diagnosis not present

## 2021-07-24 DIAGNOSIS — E119 Type 2 diabetes mellitus without complications: Secondary | ICD-10-CM | POA: Insufficient documentation

## 2021-07-24 DIAGNOSIS — L03115 Cellulitis of right lower limb: Secondary | ICD-10-CM

## 2021-07-24 DIAGNOSIS — R509 Fever, unspecified: Secondary | ICD-10-CM | POA: Diagnosis present

## 2021-07-24 DIAGNOSIS — Z79899 Other long term (current) drug therapy: Secondary | ICD-10-CM | POA: Diagnosis not present

## 2021-07-24 DIAGNOSIS — L02416 Cutaneous abscess of left lower limb: Secondary | ICD-10-CM | POA: Diagnosis not present

## 2021-07-24 DIAGNOSIS — L0390 Cellulitis, unspecified: Secondary | ICD-10-CM

## 2021-07-24 LAB — CBC WITH DIFFERENTIAL/PLATELET
Abs Immature Granulocytes: 0.06 10*3/uL (ref 0.00–0.07)
Basophils Absolute: 0.1 10*3/uL (ref 0.0–0.1)
Basophils Relative: 1 %
Eosinophils Absolute: 0.1 10*3/uL (ref 0.0–0.5)
Eosinophils Relative: 1 %
HCT: 45.5 % (ref 39.0–52.0)
Hemoglobin: 15.2 g/dL (ref 13.0–17.0)
Immature Granulocytes: 1 %
Lymphocytes Relative: 19 %
Lymphs Abs: 2.4 10*3/uL (ref 0.7–4.0)
MCH: 30 pg (ref 26.0–34.0)
MCHC: 33.4 g/dL (ref 30.0–36.0)
MCV: 89.7 fL (ref 80.0–100.0)
Monocytes Absolute: 1.5 10*3/uL — ABNORMAL HIGH (ref 0.1–1.0)
Monocytes Relative: 12 %
Neutro Abs: 8.3 10*3/uL — ABNORMAL HIGH (ref 1.7–7.7)
Neutrophils Relative %: 66 %
Platelets: 150 10*3/uL (ref 150–400)
RBC: 5.07 MIL/uL (ref 4.22–5.81)
RDW: 13.5 % (ref 11.5–15.5)
WBC: 12.4 10*3/uL — ABNORMAL HIGH (ref 4.0–10.5)
nRBC: 0 % (ref 0.0–0.2)

## 2021-07-24 LAB — BASIC METABOLIC PANEL
Anion gap: 5 (ref 5–15)
BUN: 15 mg/dL (ref 6–20)
CO2: 25 mmol/L (ref 22–32)
Calcium: 8.2 mg/dL — ABNORMAL LOW (ref 8.9–10.3)
Chloride: 104 mmol/L (ref 98–111)
Creatinine, Ser: 0.97 mg/dL (ref 0.61–1.24)
GFR, Estimated: >60 mL/min (ref >60)
Glucose, Bld: 104 mg/dL — ABNORMAL HIGH (ref 70–99)
Potassium: 3.2 mmol/L — ABNORMAL LOW (ref 3.5–5.1)
Sodium: 134 mmol/L — ABNORMAL LOW (ref 135–145)

## 2021-07-24 LAB — HEPATIC FUNCTION PANEL
ALT: 18 U/L (ref 0–44)
AST: 16 U/L (ref 15–41)
Albumin: 3.8 g/dL (ref 3.5–5.0)
Alkaline Phosphatase: 47 U/L (ref 38–126)
Bilirubin, Direct: 0.2 mg/dL (ref 0.0–0.2)
Indirect Bilirubin: 0.9 mg/dL (ref 0.3–0.9)
Total Bilirubin: 1.1 mg/dL (ref 0.3–1.2)
Total Protein: 7.1 g/dL (ref 6.5–8.1)

## 2021-07-24 LAB — LACTIC ACID, PLASMA: Lactic Acid, Venous: 0.6 mmol/L (ref 0.5–1.9)

## 2021-07-24 MED ORDER — VANCOMYCIN HCL 2000 MG/400ML IV SOLN
2000.0000 mg | Freq: Once | INTRAVENOUS | Status: DC
  Filled 2021-07-24: qty 400

## 2021-07-24 MED ORDER — IOHEXOL 350 MG/ML SOLN
80.0000 mL | Freq: Once | INTRAVENOUS | Status: AC | PRN
  Administered 2021-07-24: 80 mL via INTRAVENOUS

## 2021-07-24 MED ORDER — VANCOMYCIN HCL 2000 MG/400ML IV SOLN
2000.0000 mg | Freq: Once | INTRAVENOUS | Status: AC
  Administered 2021-07-25: 01:00:00 2000 mg via INTRAVENOUS
  Filled 2021-07-24: qty 400

## 2021-07-24 MED ORDER — IBUPROFEN 200 MG PO TABS
400.0000 mg | ORAL_TABLET | Freq: Once | ORAL | Status: AC
  Administered 2021-07-24: 20:00:00 400 mg via ORAL
  Filled 2021-07-24: qty 2

## 2021-07-24 MED ORDER — DOXYCYCLINE HYCLATE 100 MG PO TABS: 100.0000 mg | ORAL_TABLET | Freq: Two times a day (BID) | ORAL | 0 refills | Status: DC

## 2021-07-24 MED ORDER — LACTATED RINGERS IV BOLUS
1000.0000 mL | Freq: Once | INTRAVENOUS | Status: AC
  Administered 2021-07-24: 23:00:00 1000 mL via INTRAVENOUS

## 2021-07-24 NOTE — ED Provider Notes (Signed)
Emergency Medicine Provider Triage Evaluation Note  Garrett Bell , a 45 y.o. male  was evaluated in triage.  Pt complains of right inner thigh pain started 3 days ago. Seems to have worsened. Has had one dose of doxycycline today after being seen by PCP/UC.  Fever at home of 100.4. No NV. LH or dizziness.   Review of Systems  Positive: Thigh pain and redness/swelling Negative: Nausea   Physical Exam  BP (!) 154/106 (BP Location: Left Arm)    Pulse 82    Temp 98.7 F (37.1 C) (Oral)    Resp 16    SpO2 99%  Gen:   Awake, no distress Resp:  Normal effort MSK:   Moves extremities without difficulty Other:  Cellulitic appearance of right inner thigh   Medical Decision Making  Medically screening exam initiated at 7:18 PM.  Appropriate orders placed.  Garrett Bell was informed that the remainder of the evaluation will be completed by another provider, this initial triage assessment does not replace that evaluation, and the importance of remaining in the ED until their evaluation is complete.    Solon Augusta [place], [place] 07/24/21 Norberta Keens    Gloris Manchester, MD 07/25/21 [phone]

## 2021-07-24 NOTE — ED Triage Notes (Signed)
Patient here from home reporting abscess to right posterior thigh x3 days. Doxycycline with no relief that started today.

## 2021-07-24 NOTE — Patient Instructions (Signed)
Please apply warm moist compresses to the inner right thigh. We are working on getting you an immediate referral to general surgery. Someone from our office will be in touch to get you scheduled. Please be on the lookout for a phone call from our office.  I have sent in Doxycycline for you to start taking for treatment. Please apply warm moist compresses to the inner area.

## 2021-07-24 NOTE — Telephone Encounter (Signed)
I spoke with Dr. Lucretia Roers who felt patient needed to be seen in the ER since he had become febrile since time of OV.  I then spoke to patient to relay this message and explain that the conversation I had had with her. He expressed understanding and agreed to go to [facility]. He will keep appointment with [facility] that is already scheduled for tomorrow.

## 2021-07-24 NOTE — ED Provider Notes (Signed)
Provider Note MRN:  [id number]  Arrival date & time: 07/25/21    ED Course and Medical Decision Making  Assumed care from Dr. Durwin Nora at shift change.  Rapidly worsening cellulitis of the pelvic region, awaiting CT pelvis, anticipating admission.  CT scan revealing possible abscess with surrounding cellulitis.  On my exam there is a large indurated region without obvious fluctuance.  Ultrasound utilized, and there is demonstration of an abscess.  Drained as described below.  Admission offered but patient prefers discharge.  He has follow-up with a surgeon tomorrow, advised to keep this appointment for wound recheck.  He has been well-appearing with normal vital signs and no fever during his ED stay and so the alternate plan of discharge with this close follow-up is appropriate.  Marland Kitchen.Incision and Drainage  Date/Time: 07/25/2021 2:00 AM Performed by: Sabas Sous, MD Authorized by: Sabas Sous, MD   Consent:    Consent obtained:  Verbal   Consent given by:  Patient   Risks, benefits, and alternatives were discussed: yes     Risks discussed:  Bleeding, damage to other organs, infection, incomplete drainage and pain Universal protocol:    Procedure explained and questions answered to patient or proxy's satisfaction: yes     Immediately prior to procedure, a time out was called: yes     Patient identity confirmed:  Verbally with patient Location:    Type:  Abscess   Size:  2cm   Location:  Lower extremity   Lower extremity location: right upper posterior thigh. Anesthesia:    Anesthesia method:  Local infiltration   Local anesthetic:  Lidocaine 1% w/o epi Procedure type:    Complexity:  Complex Procedure details:    Ultrasound guidance: yes     Incision types:  Cruciate   Incision depth:  Subcutaneous   Wound management:  Probed and deloculated, irrigated with saline, debrided and extensive cleaning   Drainage:  Bloody and purulent   Drainage amount:  Moderate   Wound  treatment:  Wound left open   Packing materials:  1/2 in iodoform gauze Post-procedure details:    Procedure completion:  Tolerated well, no immediate complications Ultrasound ED Soft Tissue  Date/Time: 07/25/2021 2:02 AM Performed by: Sabas Sous, MD Authorized by: Sabas Sous, MD   Procedure details:    Indications: localization of abscess and evaluate for cellulitis     Transverse view:  Visualized   Longitudinal view:  Visualized   Images: not archived   Location:    Location: lower extremity     Side:  Right Findings:     abscess present    cellulitis present  Final Clinical Impressions(s) / ED Diagnoses     ICD-10-CM   1. Abscess  L02.91     2. Cellulitis, unspecified cellulitis site  L03.90       ED Discharge Orders          Ordered    oxyCODONE (ROXICODONE) 5 MG immediate release tablet  Every 4 hours PRN        07/25/21 0156              Discharge Instructions      You were evaluated in the Emergency Department and after careful evaluation, we did not find any emergent condition requiring admission or further testing in the hospital.  Your exam/testing today was overall reassuring.  Symptoms seem to be due to an abscess with surrounding cellulitis.  We drained the abscess here in the emergency department.  Continue taking the doxycycline and keep your follow-up with the surgeon tomorrow.  Please return to the Emergency Department if you experience any worsening of your condition.  Thank you for allowing [place] to be a part of your care.       Elmer Sow. Pilar Plate, MD [facility] Emergency Medicine [facility] [email or web address]    Sabas Sous, MD 07/25/21 [phone]

## 2021-07-24 NOTE — ED Provider Notes (Signed)
[facility]-EMERGENCY DEPT Provider Note   CSN: [id number] Arrival date & time: 07/24/21  1725     History Chief Complaint  Patient presents with   Abscess   Fever    Garrett Bell is a 45 y.o. male.   Abscess Associated symptoms: fever   Associated symptoms: no fatigue, no nausea and no vomiting   Fever Associated symptoms: no chest pain, no chills, no confusion, no congestion, no cough, no diarrhea, no dysuria, no ear pain, no myalgias, no nausea, no rash, no sore throat and no vomiting   Patient is a 45 year old male with history of prediabetes, presenting for worsening cellulitis.  He first noticed a area of swelling on his proximal inner right thigh 3 days ago.  Over the past 24 hours, area has increased in redness, swelling, and pain.  He was seen by his primary care doctor earlier today for this.  He was prescribed doxycycline and took 1 dose of this prior to arrival.  Following his visit with PCP, patient experienced rapid spreading of the area of redness.  He also developed a low-grade fever at home.  Prior to admitting the ED, patient was given ibuprofen.  Currently, he endorses moderate pain in this area.  He does have a history of skin infections to his right leg, as well as to his perineum.  He has no known family history of abscesses/skin infections.    Past Medical History:  Diagnosis Date   Chest pain    neg stress test 01/2009   Diabetes (HCC)    A1C 6.4 (2012)   GERD (gastroesophageal reflux disease)    Headache(784.0)    HTN (hypertension)    Traumatic partial tear of right biceps tendon 06/2015    Patient Active Problem List   Diagnosis Date Noted   Hemorrhoids 05/08/2021   Achilles tendinosis 12/15/2019   Morbid obesity (HCC) 06/10/2018   PCP NOTES >>>>>>>>>>>>>>>>>>>> 10/31/2015   Skin lesion 12/25/2013   CTS (carpal tunnel syndrome) 08/11/2012   Annual physical exam 08/20/2011   Hyperglycemia 08/20/2011   HEADACHE 06/16/2007    Essential hypertension 04/07/2007   GERD 04/07/2007    Past Surgical History:  Procedure Laterality Date   biceps surgery Right 07-04-15   tendon reattachment   CARPAL TUNNEL RELEASE Bilateral 06/11/2017   HEMORRHOID SURGERY  07-2008   thrombosed   VASECTOMY         Family History  Problem Relation Age of Onset   Hypertension Father    Stroke Other        GF   Aneurysm Other        GM, brain   CAD Maternal Grandmother    Colon cancer Neg Hx    Prostate cancer Neg Hx     Social History   Tobacco Use   Smoking status: Never   Smokeless tobacco: Never  Vaping Use   Vaping Use: Never used  Substance Use Topics   Alcohol use: No   Drug use: No    Home Medications Prior to Admission medications   Medication Sig Start Date End Date Taking? Authorizing Provider  benazepril (LOTENSIN) 40 MG tablet Take 1 tablet (40 mg total) by mouth daily. 03/12/21  Yes Paz, Nolon Rod, MD  doxycycline (VIBRA-TABS) 100 MG tablet Take 1 tablet (100 mg total) by mouth 2 (two) times daily. Patient taking differently: Take 100 mg by mouth 2 (two) times daily. Start date : 07/24/21 07/24/21  Yes Olive Bass, FNP  esomeprazole (  NEXIUM) 40 MG capsule Take 1 capsule (40 mg total) by mouth daily. 12/30/16  Yes Domenic Moras, PA-C  metoprolol succinate (TOPROL-XL) 100 MG 24 hr tablet TAKE ONE TABLET BY MOUTH EVERY MORNING AND AT BEDTIME TAKE WITH OR IMMEDIATELY FOLLOWING A MEAL Patient taking differently: 100 mg 2 (two) times daily. 04/16/21  Yes Paz, Alda Berthold, MD  oxyCODONE (ROXICODONE) 5 MG immediate release tablet Take 1 tablet (5 mg total) by mouth every 4 (four) hours as needed for severe pain. 07/25/21  Yes Maudie Flakes, MD  Tadalafil 2.5 MG TABS TAKE ONE TABLET BY MOUTH DAILY Patient taking differently: Take 2.5 mg by mouth daily. 03/05/21  Yes Paz, Alda Berthold, MD  nystatin cream (MYCOSTATIN) Apply 1 application topically 2 (two) times daily. Patient not taking: Reported on 07/24/2021 04/05/21    Saguier, Percell Miller, PA-C    Allergies    Hydrochlorothiazide and Sulfonamide derivatives  Review of Systems   Review of Systems  Constitutional:  Positive for fever. Negative for chills, diaphoresis and fatigue.  HENT:  Negative for congestion, ear pain and sore throat.   Eyes:  Negative for pain and visual disturbance.  Respiratory:  Negative for cough, chest tightness and shortness of breath.   Cardiovascular:  Negative for chest pain and palpitations.  Gastrointestinal:  Negative for abdominal pain, diarrhea, nausea and vomiting.  Genitourinary:  Negative for dysuria, flank pain, hematuria, penile discharge, penile pain, penile swelling, scrotal swelling and testicular pain.  Musculoskeletal:  Negative for arthralgias, back pain, myalgias and neck pain.  Skin:  Negative for color change and rash.       Swelling and cellulitic change to proximal right thigh  Allergic/Immunologic: Negative for immunocompromised state.  Neurological:  Negative for dizziness, seizures, syncope, weakness and numbness.  Hematological:  Does not bruise/bleed easily.  Psychiatric/Behavioral:  Negative for confusion and decreased concentration.   All other systems reviewed and are negative.  Physical Exam Updated Vital Signs BP (!) 167/106    Pulse 79    Temp 98.7 F (37.1 C) (Oral)    Resp 18    SpO2 94%   Physical Exam Vitals and nursing note reviewed.  Constitutional:      General: He is not in acute distress.    Appearance: Normal appearance. He is well-developed and normal weight. He is not ill-appearing, toxic-appearing or diaphoretic.  HENT:     Head: Normocephalic and atraumatic.     Right Ear: External ear normal.     Left Ear: External ear normal.     Nose: Nose normal.     Mouth/Throat:     Mouth: Mucous membranes are moist.     Pharynx: Oropharynx is clear.  Eyes:     Extraocular Movements: Extraocular movements intact.     Conjunctiva/sclera: Conjunctivae normal.  Cardiovascular:      Rate and Rhythm: Normal rate and regular rhythm.     Heart sounds: No murmur heard. Pulmonary:     Effort: Pulmonary effort is normal. No respiratory distress.     Breath sounds: Normal breath sounds.  Abdominal:     Palpations: Abdomen is soft.     Tenderness: There is no abdominal tenderness.  Musculoskeletal:        General: Tenderness present. No swelling. Normal range of motion.     Cervical back: Normal range of motion and neck supple. No rigidity.  Skin:    General: Skin is warm and dry.     Capillary Refill: Capillary refill takes less than 2  seconds.     Findings: Erythema present.     Comments: Warmth, erythema, induration, and tenderness to proximal inner right thigh.  Neurological:     General: No focal deficit present.     Mental Status: He is alert and oriented to person, place, and time.     Cranial Nerves: No cranial nerve deficit.     Sensory: No sensory deficit.     Motor: No weakness.  Psychiatric:        Mood and Affect: Mood normal.        Behavior: Behavior normal.        Thought Content: Thought content normal.        Judgment: Judgment normal.    ED Results / Procedures / Treatments   Labs (all labs ordered are listed, but only abnormal results are displayed) Labs Reviewed  CBC WITH DIFFERENTIAL/PLATELET - Abnormal; Notable for the following components:      Result Value   WBC 12.4 (*)    Neutro Abs 8.3 (*)    Monocytes Absolute 1.5 (*)    All other components within normal limits  BASIC METABOLIC PANEL - Abnormal; Notable for the following components:   Sodium 134 (*)    Potassium 3.2 (*)    Glucose, Bld 104 (*)    Calcium 8.2 (*)    All other components within normal limits  C-REACTIVE PROTEIN - Abnormal; Notable for the following components:   CRP 7.4 (*)    All other components within normal limits  CULTURE, BLOOD (ROUTINE X 2)  CULTURE, BLOOD (ROUTINE X 2)  SEDIMENTATION RATE  LACTIC ACID, PLASMA  HEPATIC FUNCTION PANEL  LACTIC ACID,  PLASMA    EKG None  Radiology CT PELVIS W CONTRAST  Result Date: 07/25/2021 CLINICAL DATA:  Soft tissue infection suspected. Right inner thigh pain. EXAM: CT PELVIS WITH CONTRAST TECHNIQUE: Multidetector CT imaging of the pelvis was performed using the standard protocol following the bolus administration of intravenous contrast. CONTRAST:  28mL OMNIPAQUE IOHEXOL 350 MG/ML SOLN COMPARISON:  None. FINDINGS: Urinary Tract:  No abnormality visualized. Bowel: No dilatation or wall thickening including the appendix. Uncomplicated sigmoid diverticulosis. Vascular/Lymphatic: There are mildly enlarged right inguinal chain nodes up to 1.3 cm in short axis, a few borderline size left inguinal chain nodes. In the pelvis there are slightly prominent right external iliac chain nodes up to 1.2 cm short axis. There is no further pelvic adenopathy. The iliac and proximal femoral arteries are normal. Reproductive: There is no prostatomegaly. The testicles are normal in attenuation and there is a small left scrotal hydrocele. There are nonspecific calcifications in the scrotal wall Other: There is no pelvic free air, hemorrhage or fluid. No umbilical hernia. No inguinal hernia. Musculoskeletal: There are L5 pars defects with a chronic appearance and trace grade 1 L5-S1 spondylolisthesis without disc collapse or foraminal stenosis. The bony pelvis, sacrum and proximal femurs are intact. Imaging was carried out through the proximal thighs. In the medial upper right thigh there is edema in the subcutaneous plane with skin thickening. There is a small subcutaneous fluid collection in the medial upper thigh just below and posterior to the right inguinal fold measuring 1.8 x 1.6 cm and 15.7 Hounsfield units which is most likely a small abscess, beginning about 5 mm deep to the thickened skin. No perianal abscess is seen. This is well below the anal verge. IMPRESSION: 1. Induration and skin thickening in the medial upper right  thigh most likely due to cellulitis, with  small subcutaneous fluid collection most consistent with a small abscess (measuring 1.8 x 1.6 cm). 2. Mildly prominent inguinal lymph nodes right greater than left most likely reactive, slightly prominent right external iliac chain nodes. 3. Diverticulosis without evidence of diverticulitis. 4. Small left scrotal hydrocele. 5. Nonspecific calcifications of the scrotal wall. 6. 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 Electronically Signed   By: Telford Nab M.D.   On: 07/25/2021 00:02    Procedures Procedures   Medications Ordered in ED Medications  ibuprofen (ADVIL) tablet 400 mg (400 mg Oral Given 07/24/21 1951)  lactated ringers bolus 1,000 mL (0 mLs Intravenous Stopped 07/25/21 0036)  vancomycin (VANCOREADY) IVPB 2000 mg/400 mL (0 mg Intravenous Stopped 07/25/21 0221)  iohexol (OMNIPAQUE) 350 MG/ML injection 80 mL (80 mLs Intravenous Contrast Given 07/24/21 2341)  lidocaine (PF) (XYLOCAINE) 1 % injection 30 mL (30 mLs Infiltration Given 07/25/21 0046)    ED Course  I have reviewed the triage vital signs and the nursing notes.  Pertinent labs & imaging results that were available during my care of the patient were reviewed by me and considered in my medical decision making (see chart for details).    MDM Rules/Calculators/A&P                         Patient is for worsening cellulitis and abscess to area proximal right inner thigh.  Area of swelling was first noticed 3 days ago.  Today, he had rapid worsening of the size of the area as well as the swelling and associated pain.  He also developed a low-grade fever at home.  On arrival in the ED, he is well-appearing.  He does not seem to have risk factors for necrotizing infection, areas concerning for development of Fournier's gangrene.  Laboratory work-up and CT scan were ordered.  Patient declined any analgesia at this time.  IV fluids and vancomycin ordered.  Care of patient  was signed out to oncoming ED provider.  Final Clinical Impression(s) / ED Diagnoses Final diagnoses:  Abscess  Cellulitis, unspecified cellulitis site    Rx / DC Orders ED Discharge Orders          Ordered    oxyCODONE (ROXICODONE) 5 MG immediate release tablet  Every 4 hours PRN        07/25/21 0156             Godfrey Pick, MD 07/25/21 [phone]

## 2021-07-24 NOTE — Telephone Encounter (Signed)
Dr. Lucretia Roers from [facility] called and stated that she would like a call back on her cell. She wants to get some more information regarding appointment to see if pt needs to come into hospital on this evening.  Cell#: [phone]

## 2021-07-24 NOTE — Progress Notes (Signed)
Garrett Bell is a 45 y.o. male with the following history as recorded in Epic[employer]:  Patient Active Problem List   Diagnosis Date Noted   Hemorrhoids 05/08/2021   Achilles tendinosis 12/15/2019   Morbid obesity (HCC) 06/10/2018   PCP NOTES >>>>>>>>>>>>>>>>>>>> 10/31/2015   Skin lesion 12/25/2013   CTS (carpal tunnel syndrome) 08/11/2012   Annual physical exam 08/20/2011   Hyperglycemia 08/20/2011   HEADACHE 06/16/2007   Essential hypertension 04/07/2007   GERD 04/07/2007    Current Outpatient Medications  Medication Sig Dispense Refill   benazepril (LOTENSIN) 40 MG tablet Take 1 tablet (40 mg total) by mouth daily. 90 tablet 1   esomeprazole (NEXIUM) 40 MG capsule Take 1 capsule (40 mg total) by mouth daily. 30 capsule 0   metoprolol succinate (TOPROL-XL) 100 MG 24 hr tablet TAKE ONE TABLET BY MOUTH EVERY MORNING AND AT BEDTIME TAKE WITH OR IMMEDIATELY FOLLOWING A MEAL (Patient taking differently: 100 mg 2 (two) times daily.) 180 tablet 1   nystatin cream (MYCOSTATIN) Apply 1 application topically 2 (two) times daily. (Patient not taking: Reported on 07/24/2021) 30 g 0   Tadalafil 2.5 MG TABS TAKE ONE TABLET BY MOUTH DAILY (Patient taking differently: Take 2.5 mg by mouth daily.) 30 tablet 6   doxycycline (VIBRA-TABS) 100 MG tablet Take 1 tablet (100 mg total) by mouth 2 (two) times daily. (Patient taking differently: Take 100 mg by mouth 2 (two) times daily. Start date : 07/24/21) 20 tablet 0   oxyCODONE (ROXICODONE) 5 MG immediate release tablet Take 1 tablet (5 mg total) by mouth every 4 (four) hours as needed for severe pain. 5 tablet 0   No current facility-administered medications for this visit.    Allergies: Hydrochlorothiazide and Sulfonamide derivatives  Past Medical History:  Diagnosis Date   Chest pain    neg stress test 01/2009   Diabetes (HCC)    A1C 6.4 (2012)   GERD (gastroesophageal reflux disease)    Headache(784.0)    HTN (hypertension)    Traumatic partial  tear of right biceps tendon 06/2015    Past Surgical History:  Procedure Laterality Date   biceps surgery Right 07-04-15   tendon reattachment   CARPAL TUNNEL RELEASE Bilateral 06/11/2017   HEMORRHOID SURGERY  07-2008   thrombosed   VASECTOMY      Family History  Problem Relation Age of Onset   Hypertension Father    Stroke Other        GF   Aneurysm Other        GM, brain   CAD Maternal Grandmother    Colon cancer Neg Hx    Prostate cancer Neg Hx     Social History   Tobacco Use   Smoking status: Never   Smokeless tobacco: Never  Substance Use Topics   Alcohol use: No    Subjective:   Suspected boil in inner right thigh; symptoms x 4-5 days; painful to touch; has had problems with recurrent cellulitis on his right extremity in the past;      Objective:  Vitals:   07/24/21 1337  BP: (!) 150/100  Pulse: 88  Temp: 99.7 F (37.6 C)  TempSrc: Oral  SpO2: 98%  Weight: 253 lb 12.8 oz (115.1 kg)  Height: 5\' 10"  (1.778 m)    General: Well developed, well nourished, in no acute distress  Skin : Warm and dry. Large abscess noted over right groin area with surrounding cellulitis;  Lungs: Respirations unlabored;  Neurologic: Alert and oriented; speech  intact; face symmetrical; moves all extremities well; CNII-XII intact without focal deficit   Assessment:  1. Abscess   2. Cellulitis of right lower extremity     Plan:  Suspect he will need to be seen at ER today- ? Need for IV antibiotics; will put in STAT surgical referral hoping he can be seen immediately; will go ahead and start Doxycycline; follow up to be determined based on surgeon's response.   This visit occurred during the SARS-CoV-2 public health emergency.  Safety protocols were in place, including screening questions prior to the visit, additional usage of staff PPE, and extensive cleaning of exam room while observing appropriate contact time as indicated for disinfecting solutions.    No follow-ups on  file.  Orders Placed This Encounter  Procedures   Ambulatory referral to General Surgery    Referral Priority:   Emergency    Referral Type:   Surgical    Referral Reason:   Specialty Services Required    Requested Specialty:   General Surgery    Number of Visits Requested:   1    Requested Prescriptions   Signed Prescriptions Disp Refills   doxycycline (VIBRA-TABS) 100 MG tablet 20 tablet 0    Sig: Take 1 tablet (100 mg total) by mouth 2 (two) times daily.    Patient taking differently: Take 100 mg by mouth 2 (two) times daily. Start date : 07/24/21

## 2021-07-25 LAB — SEDIMENTATION RATE: Sed Rate: 15 mm/hr (ref 0–16)

## 2021-07-25 LAB — C-REACTIVE PROTEIN: CRP: 7.4 mg/dL — ABNORMAL HIGH (ref <1.0)

## 2021-07-25 MED ORDER — OXYCODONE HCL 5 MG PO TABS: 5.0000 mg | ORAL_TABLET | ORAL | 0 refills | Status: DC | PRN

## 2021-07-25 MED ORDER — LIDOCAINE HCL (PF) 1 % IJ SOLN
30.0000 mL | Freq: Once | INTRAMUSCULAR | Status: AC
  Administered 2021-07-25: 01:00:00 30 mL
  Filled 2021-07-25: qty 30

## 2021-07-25 NOTE — Discharge Instructions (Addendum)
You were evaluated in the Emergency Department and after careful evaluation, we did not find any emergent condition requiring admission or further testing in the hospital.  Your exam/testing today was overall reassuring.  Symptoms seem to be due to an abscess with surrounding cellulitis.  We drained the abscess here in the emergency department.  Continue taking the doxycycline and keep your follow-up with the surgeon tomorrow.  Please return to the Emergency Department if you experience any worsening of your condition.  Thank you for allowing [place] to be a part of your care.

## 2021-07-30 LAB — CULTURE, BLOOD (ROUTINE X 2)
Culture: NO GROWTH
Special Requests: ADEQUATE

## 2021-09-10 ENCOUNTER — Other Ambulatory Visit: Payer: Self-pay | Admitting: Internal Medicine

## 2021-10-04 ENCOUNTER — Ambulatory Visit (INDEPENDENT_AMBULATORY_CARE_PROVIDER_SITE_OTHER): Payer: Managed Care, Other (non HMO) | Admitting: Internal Medicine

## 2021-10-04 ENCOUNTER — Encounter: Payer: Self-pay | Admitting: Internal Medicine

## 2021-10-04 VITALS — BP 138/86 | HR 52 | Temp 97.9°F | Resp 16 | Ht 70.0 in | Wt 252.0 lb

## 2021-10-04 DIAGNOSIS — Z1159 Encounter for screening for other viral diseases: Secondary | ICD-10-CM

## 2021-10-04 DIAGNOSIS — R739 Hyperglycemia, unspecified: Secondary | ICD-10-CM | POA: Diagnosis not present

## 2021-10-04 DIAGNOSIS — Z0000 Encounter for general adult medical examination without abnormal findings: Secondary | ICD-10-CM

## 2021-10-04 DIAGNOSIS — R7989 Other specified abnormal findings of blood chemistry: Secondary | ICD-10-CM | POA: Diagnosis not present

## 2021-10-04 DIAGNOSIS — I10 Essential (primary) hypertension: Secondary | ICD-10-CM | POA: Diagnosis not present

## 2021-10-04 LAB — TSH: TSH: 2.92 u[IU]/mL (ref 0.35–5.50)

## 2021-10-04 LAB — LIPID PANEL
Cholesterol: 227 mg/dL — ABNORMAL HIGH (ref 0–200)
HDL: 47.5 mg/dL (ref >39.00)
LDL Cholesterol: 162 mg/dL — ABNORMAL HIGH (ref 0–99)
NonHDL: 179.09
Total CHOL/HDL Ratio: 5
Triglycerides: 83 mg/dL (ref 0.0–149.0)
VLDL: 16.6 mg/dL (ref 0.0–40.0)

## 2021-10-04 LAB — BASIC METABOLIC PANEL
BUN: 25 mg/dL — ABNORMAL HIGH (ref 6–23)
CO2: 30 mEq/L (ref 19–32)
Calcium: 9.4 mg/dL (ref 8.4–10.5)
Chloride: 104 mEq/L (ref 96–112)
Creatinine, Ser: 0.92 mg/dL (ref 0.40–1.50)
GFR: 98.73 mL/min (ref >60.00)
Glucose, Bld: 107 mg/dL — ABNORMAL HIGH (ref 70–99)
Potassium: 4.7 mEq/L (ref 3.5–5.1)
Sodium: 140 mEq/L (ref 135–145)

## 2021-10-04 LAB — T4, FREE: Free T4: 0.78 ng/dL (ref 0.60–1.60)

## 2021-10-04 LAB — HEMOGLOBIN A1C: Hgb A1c MFr Bld: 5.9 % (ref 4.6–6.5)

## 2021-10-04 NOTE — Progress Notes (Signed)
? ?Subjective:  ? ? Patient ID: Garrett Bell, male    DOB: [birth year], 46 y.o.   MRN: [id number] ? ?DOS:  10/04/2021 ?Type of visit - description: cpx ? ?Since the last visit, had problems with an abscess and induration at the R thigh documented by CT. ?It was draining at the ER, subsequently seen by surgery, last seen 08/02/2021 he was doing better.  At this point he is asymptomatic. ? ?Other than above he is doing well. ?He is trying to eat healthier ? ?Wt Readings from Last 3 Encounters:  ?10/04/21 252 lb (114.3 kg)  ?07/24/21 253 lb 12.8 oz (115.1 kg)  ?05/31/21 257 lb (116.6 kg)  ? ? ? ?Review of Systems ? ?Other than above, a 14 point review of systems is negative  ? ?  ? ?Past Medical History:  ?Diagnosis Date  ? Chest pain   ? neg stress test 01/2009  ? Diabetes ([place])   ? A1C 6.4 (2012)  ? GERD (gastroesophageal reflux disease)   ? Headache(784.0)   ? HTN (hypertension)   ? Traumatic partial tear of right biceps tendon 06/2015  ? ? ?Past Surgical History:  ?Procedure Laterality Date  ? biceps surgery Right 07-04-15  ? tendon reattachment  ? CARPAL TUNNEL RELEASE Bilateral 06/11/2017  ? HEMORRHOID SURGERY  07-2008  ? thrombosed  ? VASECTOMY    ? ?Social History  ? ?Socioeconomic History  ? Marital status: Married  ?  Spouse name: Not on file  ? Number of children: 5  ? Years of education: Not on file  ? Highest education level: Not on file  ?Occupational History  ? Occupation: Location manager   ?Tobacco Use  ? Smoking status: Never  ? Smokeless tobacco: Never  ?Vaping Use  ? Vaping Use: Never used  ?Substance and Sexual Activity  ? Alcohol use: No  ? Drug use: No  ? Sexual activity: Yes  ?Other Topics Concern  ? Not on file  ?Social History Narrative  ? divorced 2011, 5 children   ? household is pt, 3 of the children and wife (remarried)  ? ?Social Determinants of Health  ? ?Financial Resource Strain: Not on file  ?Food Insecurity: Not on file  ?Transportation Needs: Not on file  ?Physical Activity: Not on  file  ?Stress: Not on file  ?Social Connections: Not on file  ?Intimate Partner Violence: Not on file  ? ? ? ?Current Outpatient Medications  ?Medication Instructions  ? benazepril (LOTENSIN) 40 MG tablet TAKE ONE TABLET BY MOUTH DAILY  ? esomeprazole (NEXIUM) 40 mg, Oral, Daily  ? metoprolol succinate (TOPROL-XL) 100 MG 24 hr tablet TAKE ONE TABLET BY MOUTH EVERY MORNING AND AT BEDTIME TAKE WITH OR IMMEDIATELY FOLLOWING A MEAL  ? Tadalafil 2.5 MG TABS TAKE ONE TABLET BY MOUTH DAILY  ? ? ?   ?Objective:  ? Physical Exam ?BP 138/86 (BP Location: Left Arm, Patient Position: Sitting, Cuff Size: Normal)   Pulse (!) 52   Temp 97.9 ?F (36.6 ?C) (Oral)   Resp 16   Ht 5\' 10"  (1.778 m)   Wt 252 lb (114.3 kg)   SpO2 98%   BMI 36.16 kg/m?  ?General: ?Well developed, NAD, BMI noted ?Neck: No  thyromegaly  ?HEENT:  ?Normocephalic . Face symmetric, atraumatic ?Lungs:  ?CTA B ?Normal respiratory effort, no intercostal retractions, no accessory muscle use. ?Heart: RRR,  no murmur.  ?Abdomen:  ?Not distended, soft, non-tender. No rebound or rigidity.   ?Lower extremities: no pretibial  edema bilaterally  ?Skin: Exposed areas without rash. Not pale. Not jaundice ?Neurologic:  ?alert & oriented X3.  ?Speech normal, gait appropriate for age and unassisted ?Strength symmetric and appropriate for age.  ?Psych: ?Cognition and judgment appear intact.  ?Cooperative with normal attention span and concentration.  ?Behavior appropriate. ?No anxious or depressed appearing. ? ?   ?Assessment   ? ?Assessment ?Prediabetes dx- 2012, A1c 6.4 ?HTN ?- (HCTZ and sulfa) : anaphylaxis,  ?-Saw cardiology 08/10/2019: ?-Echo: Mild LVH, grade 1 diastolic dysfunction. 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 ?PLAN ?Here for CPX ?Prediabetes: Check A1c noting that he has changed his lifestyle,  losing some weight. ?HTN: Ambulatory BPs in the 130s/80s.  No change, continue Lotensin. ?GERD: On Protonix, controlled. ?Obesity: Has changed his lifestyle over the last few months, + weight loss. Praised! ?Right leg abscess/cellulitis: Had a cellulitis in that extremity in 2021 and again recently.  Things are back to normal. ?RTC 6 months ?  ? ?This visit occurred during the SARS-CoV-2 public health emergency.  Safety protocols were in place, including screening questions prior to the visit, additional usage of staff PPE, and extensive cleaning of exam room while observing appropriate contact time as indicated for disinfecting solutions.  ? ?

## 2021-10-04 NOTE — Patient Instructions (Signed)
Check the  blood pressure regularly ?BP GOAL is between 110/65 and  135/85. ?If it is consistently higher or lower, let me know ? ?Return the stool cards at your earliest convenience ? ? ?GO TO THE LAB : Get the blood work   ? ? ?GO TO THE FRONT DESK, PLEASE SCHEDULE YOUR APPOINTMENTS ?Come back for a checkup in 6 months ?

## 2021-10-04 NOTE — Assessment & Plan Note (Signed)
-  Td 2019 ?- moderna x3, booster d/w pt ?-  flu shot  @ work but has not pursued this season ?- CCS: 3 options discussed, elected iFOB ?- Labs: BMP, FLP, A1c, TSH, T4, hep C ?- Diet exercise: He is doing better. Praised  ? ? ?

## 2021-10-04 NOTE — Assessment & Plan Note (Signed)
Here for CPX ?Prediabetes: Check A1c noting that he has changed his lifestyle,  losing some weight. ?HTN: Ambulatory BPs in the 130s/80s.  No change, continue Lotensin. ?GERD: On Protonix, controlled. ?Obesity: Has changed his lifestyle over the last few months, + weight loss. Praised! ?Right leg abscess/cellulitis: Had a cellulitis in that extremity in 2021 and again recently.  Things are back to normal. ?RTC 6 months ?

## 2021-10-05 LAB — HEPATITIS C ANTIBODY
Hepatitis C Ab: NONREACTIVE
SIGNAL TO CUT-OFF: <0.02 (ref <1.00)

## 2021-10-09 ENCOUNTER — Other Ambulatory Visit: Payer: Self-pay | Admitting: Internal Medicine

## 2021-10-15 ENCOUNTER — Other Ambulatory Visit: Payer: Self-pay | Admitting: Internal Medicine

## 2021-12-27 ENCOUNTER — Ambulatory Visit (INDEPENDENT_AMBULATORY_CARE_PROVIDER_SITE_OTHER): Payer: Managed Care, Other (non HMO) | Admitting: Family Medicine

## 2021-12-27 ENCOUNTER — Encounter: Payer: Self-pay | Admitting: Family Medicine

## 2021-12-27 VITALS — BP 138/90 | HR 62 | Temp 97.9°F | Ht 70.0 in | Wt 267.1 lb

## 2021-12-27 DIAGNOSIS — L02425 Furuncle of right lower limb: Secondary | ICD-10-CM

## 2021-12-27 MED ORDER — DOXYCYCLINE HYCLATE 100 MG PO TABS: 100.0000 mg | ORAL_TABLET | Freq: Two times a day (BID) | ORAL | 0 refills | Status: AC

## 2021-12-27 NOTE — Patient Instructions (Signed)
Ice/cold pack over area for 10-15 min twice daily.  OK to take Tylenol 1000 mg (2 extra strength tabs) or 975 mg (3 regular strength tabs) every 6 hours as needed.  Ibuprofen 400-600 mg (2-3 over the counter strength tabs) every 6 hours as needed for pain.  Consider warm compresses over the area as well.   When you do wash it, use only soap and water. Do not vigorously scrub. Keep the area clean and dry.   Things to look out for: increasing pain not relieved by ibuprofen/acetaminophen, fevers, spreading redness, drainage of pus, or foul odor.  To prepare a bleach bath, one-fourth to one-half cup of bleach is placed in a full bathtub (about 40 gallons) of water. Bleach baths are usually taken for 5 to 10 minutes twice per week and should be followed by application of an emollient.  Let us know if you need anything.

## 2021-12-27 NOTE — Progress Notes (Signed)
Chief Complaint  Patient presents with   Cellulitis    Or insect bite    Garrett Bell is a 46 y.o. male here for a skin complaint.  Duration: 2 days Location: R thigh Pruritic? No Painful? Yes Drainage? No Other associated symptoms: slight swelling, spreading; no fevers Therapies tried thus far: TAO  Past Medical History:  Diagnosis Date   Chest pain    neg stress test 01/2009   Diabetes ([place])    A1C 6.4 (2012)   GERD (gastroesophageal reflux disease)    Headache(784.0)    HTN (hypertension)    Traumatic partial tear of right biceps tendon 06/2015    BP 138/90   Pulse 62   Temp 97.9 F (36.6 C) (Oral)   Ht 5\' 10"  (1.778 m)   Wt 267 lb 2 oz (121.2 kg)   SpO2 96%   BMI 38.33 kg/m  Gen: awake, alert, appearing stated age Lungs: No accessory muscle use Skin: See below. +TTP, warm, indurated. No drainage, fluctuance, excoriation Psych: Age appropriate judgment and insight   R antero-lateral thigh  Furuncle of right thigh - Plan: doxycycline (VIBRA-TABS) 100 MG tablet  7 d of doxy. This likely started as a furuncle and is progressing to cellulitis which he has had before. Warm compresses, ice, Tylenol, ibuprofen. Warning signs and symptoms verbalized and written down in AVS.  Monitor BP.  F/u prn. The patient voiced understanding and agreement to the plan.  [place], DO 12/27/21 7:09 AM

## 2022-03-16 ENCOUNTER — Other Ambulatory Visit: Payer: Self-pay | Admitting: Internal Medicine

## 2022-04-04 ENCOUNTER — Ambulatory Visit: Payer: Managed Care, Other (non HMO) | Admitting: Internal Medicine

## 2022-04-04 ENCOUNTER — Encounter: Payer: Self-pay | Admitting: Internal Medicine

## 2022-04-06 ENCOUNTER — Other Ambulatory Visit: Payer: Self-pay | Admitting: Internal Medicine

## 2022-05-06 ENCOUNTER — Other Ambulatory Visit: Payer: Self-pay | Admitting: Internal Medicine

## 2022-05-21 ENCOUNTER — Other Ambulatory Visit: Payer: Self-pay | Admitting: Internal Medicine

## 2022-05-30 ENCOUNTER — Ambulatory Visit: Payer: Managed Care, Other (non HMO) | Admitting: Internal Medicine

## 2022-05-30 ENCOUNTER — Encounter: Payer: Self-pay | Admitting: Internal Medicine

## 2022-05-30 VITALS — BP 138/84 | HR 67 | Temp 97.8°F | Resp 18 | Ht 70.0 in | Wt 273.2 lb

## 2022-05-30 DIAGNOSIS — I10 Essential (primary) hypertension: Secondary | ICD-10-CM

## 2022-05-30 LAB — LIPID PANEL
Cholesterol: 235 mg/dL — ABNORMAL HIGH (ref 0–200)
HDL: 42.5 mg/dL (ref >39.00)
LDL Cholesterol: 163 mg/dL — ABNORMAL HIGH (ref 0–99)
NonHDL: 192.82
Total CHOL/HDL Ratio: 6
Triglycerides: 150 mg/dL — ABNORMAL HIGH (ref 0.0–149.0)
VLDL: 30 mg/dL (ref 0.0–40.0)

## 2022-05-30 LAB — BASIC METABOLIC PANEL
BUN: 23 mg/dL (ref 6–23)
CO2: 31 mEq/L (ref 19–32)
Calcium: 9.5 mg/dL (ref 8.4–10.5)
Chloride: 101 mEq/L (ref 96–112)
Creatinine, Ser: 0.98 mg/dL (ref 0.40–1.50)
GFR: 91.11 mL/min (ref >60.00)
Glucose, Bld: 94 mg/dL (ref 70–99)
Potassium: 4.2 mEq/L (ref 3.5–5.1)
Sodium: 139 mEq/L (ref 135–145)

## 2022-05-30 MED ORDER — BENAZEPRIL HCL 40 MG PO TABS: 40.0000 mg | ORAL_TABLET | Freq: Every day | ORAL | 1 refills | Status: DC

## 2022-05-30 MED ORDER — METOPROLOL SUCCINATE ER 100 MG PO TB24: 100.0000 mg | ORAL_TABLET | Freq: Two times a day (BID) | ORAL | 1 refills | Status: DC

## 2022-05-30 NOTE — Assessment & Plan Note (Signed)
HTN: BP today is very good, normal in the ambulatory setting, continue benazepril, metoprolol.  Check BMP Dyslipidemia: Last LDL elevated, we talk about diet, unable to exercise much lately.  Check FLP. Neck radiculopathy, left knee meniscal tear.  Having problems with neck pain, had a MRI, report to be scanned, extensive DJD, left foraminal stenosis noted.  Also left knee MRI showed a meniscal tear.  On gabapentin and tramadol Rx'd by Ortho Preventive care: Had a flu shot, COVID booster recommended RTC 11/2022 for CPX

## 2022-05-30 NOTE — Patient Instructions (Addendum)
Vaccines I recommend:  Covid booster  Check the  blood pressure regularly BP GOAL is between 110/65 and  135/85. If it is consistently higher or lower, let me know    GO TO THE LAB : Get the blood work     GO TO THE FRONT DESK, PLEASE SCHEDULE YOUR APPOINTMENTS Come back for a physical exam by March 2024 

## 2022-05-30 NOTE — Progress Notes (Signed)
   Subjective:    Patient ID: Garrett Bell, male    DOB: 01/13/1976, 46 y.o.   MRN: [id number]  DOS:  05/30/2022 Type of visit - description: Follow-up  Today we talk about his chronic medical problems. Also has severe left neck pain and left knee pain.  Under the care of Ortho.  Review of Systems See above   Past Medical History:  Diagnosis Date   Chest pain    neg stress test 01/2009   Diabetes ([place])    A1C 6.4 (2012)   GERD (gastroesophageal reflux disease)    Headache(784.0)    HTN (hypertension)    Traumatic partial tear of right biceps tendon 06/2015    Past Surgical History:  Procedure Laterality Date   biceps surgery Right 07-04-15   tendon reattachment   CARPAL TUNNEL RELEASE Bilateral 06/11/2017   HEMORRHOID SURGERY  07-2008   thrombosed   VASECTOMY      Current Outpatient Medications  Medication Instructions   benazepril (LOTENSIN) 40 MG tablet TAKE ONE TABLET BY MOUTH DAILY   esomeprazole (NEXIUM) 40 mg, Oral, Daily   gabapentin (NEURONTIN) 300 mg, Oral, Daily   methocarbamol (ROBAXIN) 500 mg, Oral, Daily   metoprolol succinate (TOPROL-XL) 100 mg, Oral, 2 times daily   Tadalafil 2.5 mg, Oral, Daily   traMADol (ULTRAM) 50 mg, Oral, Every 6 hours PRN       Objective:   Physical Exam BP 138/84   Pulse 67   Temp 97.8 F (36.6 C) (Oral)   Resp 18   Ht 5\' 10"  (1.778 m)   Wt 273 lb 4 oz (123.9 kg)   SpO2 98%   BMI 39.21 kg/m  General:   Well developed, NAD, BMI noted. HEENT:  Normocephalic . Face symmetric, atraumatic Lungs:  CTA B Normal respiratory effort, no intercostal retractions, no accessory muscle use. Heart: RRR,  no murmur.  Lower extremities: no pretibial edema bilaterally  Skin: Not pale. Not jaundice Neurologic:  alert & oriented X3.  Speech normal, gait appropriate for age and unassisted Psych--  Cognition and judgment appear intact.  Cooperative with normal attention span and concentration.  Behavior appropriate. No anxious  or depressed appearing.      Assessment     Assessment Prediabetes dx- 2012, A1c 6.4 HTN - (HCTZ and sulfa) : anaphylaxis,  -Saw cardiology 08/10/2019: -Echo: Mild LVH, grade 1 diastolic dysfunction. -Vascular ultrasound: Mild bilateral stenosis of renal artery.  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  Check BMP Dyslipidemia: Last LDL elevated, we talk about diet, unable to exercise much lately.  Check FLP. Neck radiculopathy, left knee meniscal tear.  Having problems with neck pain, had a MRI, report to be scanned, extensive DJD, left foraminal stenosis noted.  Also left knee MRI showed a meniscal tear.  On gabapentin and tramadol Rx'd by Ortho Preventive care: Had a flu shot, COVID booster recommended RTC 11/2022 for CPX

## 2022-06-06 DIAGNOSIS — M5412 Radiculopathy, cervical region: Secondary | ICD-10-CM | POA: Insufficient documentation

## 2022-08-06 ENCOUNTER — Telehealth: Payer: Self-pay

## 2022-08-06 ENCOUNTER — Ambulatory Visit (HOSPITAL_COMMUNITY): Payer: Self-pay | Admitting: Orthopedic Surgery

## 2022-08-06 NOTE — Telephone Encounter (Signed)
Received surgical clearance form from [facility]. Pt needing Cervical disc replacement C5-6 with Dr. Rolena Infante on 08/29/22. Pt needs appt. Mychart message sent.

## 2022-08-07 NOTE — Telephone Encounter (Signed)
Garrett Bell- can you contact Pt and schedule surgical clearance at his earliest convenience please?

## 2022-08-08 NOTE — Telephone Encounter (Signed)
Pt schedule appt for 1-16 at 10:20. Done

## 2022-08-13 ENCOUNTER — Ambulatory Visit: Payer: Managed Care, Other (non HMO) | Admitting: Internal Medicine

## 2022-08-13 ENCOUNTER — Encounter: Payer: Self-pay | Admitting: Internal Medicine

## 2022-08-13 VITALS — BP 136/82 | HR 67 | Temp 97.9°F | Resp 18 | Ht 70.0 in | Wt 267.4 lb

## 2022-08-13 DIAGNOSIS — Z01818 Encounter for other preprocedural examination: Secondary | ICD-10-CM | POA: Diagnosis not present

## 2022-08-13 DIAGNOSIS — I10 Essential (primary) hypertension: Secondary | ICD-10-CM | POA: Diagnosis not present

## 2022-08-13 NOTE — Telephone Encounter (Signed)
Pt cleared for surgery. Form, OV note and EKG faxed to Attn: Glendale Chard at [phone]. Form sent for scanning.

## 2022-08-13 NOTE — Telephone Encounter (Signed)
Received fax confirmation.  

## 2022-08-13 NOTE — Patient Instructions (Signed)
I am clearing you for surgery. See you  in March

## 2022-08-13 NOTE — Progress Notes (Signed)
Subjective:    Patient ID: Garrett Bell, male    DOB: [birth year], 47 y.o.   MRN: [id number]  DOS:  08/13/2022 Type of visit - description: Surgical clearance  Patient in need of cervical spine surgery due to left arm numbness.  Reports that other than that he is feeling well. Denies chest pain or difficulty breathing.  No palpitations. At some point there was a question of his sleep apnea, states he still snores but denies fatigue or falling asleep easily. Able to do all his ADLs without difficulty breathing, able to go to flights of stairs without problems  BP Readings from Last 3 Encounters:  08/13/22 136/82  05/30/22 138/84  12/27/21 138/90    Review of Systems See above   Past Medical History:  Diagnosis Date   Chest pain    neg stress test 01/2009   Diabetes ([place])    A1C 6.4 (2012)   GERD (gastroesophageal reflux disease)    Headache(784.0)    HTN (hypertension)    Traumatic partial tear of right biceps tendon 06/2015    Past Surgical History:  Procedure Laterality Date   biceps surgery Right 07-04-15   tendon reattachment   CARPAL TUNNEL RELEASE Bilateral 06/11/2017   HEMORRHOID SURGERY  07-2008   thrombosed   VASECTOMY      Current Outpatient Medications  Medication Instructions   benazepril (LOTENSIN) 40 mg, Oral, Daily   esomeprazole (NEXIUM) 40 mg, Oral, Daily   methocarbamol (ROBAXIN) 500 mg, Oral, Daily   metoprolol succinate (TOPROL-XL) 100 mg, Oral, 2 times daily   Tadalafil 2.5 mg, Oral, Daily   traMADol (ULTRAM) 50 mg, Every 6 hours PRN       Objective:   Physical Exam BP 136/82   Pulse 67   Temp 97.9 F (36.6 C) (Oral)   Resp 18   Ht 5\' 10"  (1.778 m)   Wt 267 lb 6 oz (121.3 kg)   SpO2 98%   BMI 38.36 kg/m  General:   Well developed, NAD, BMI noted. HEENT:  Normocephalic . Face symmetric, atraumatic Neck: No JVD Lungs:  CTA B Normal respiratory effort, no intercostal retractions, no accessory muscle use. Heart: RRR,  no  murmur.  Lower extremities: no pretibial edema bilaterally  Skin: Not pale. Not jaundice Neurologic:  alert & oriented X3.  Speech normal, gait appropriate for age and unassisted Psych--  Cognition and judgment appear intact.  Cooperative with normal attention span and concentration.  Behavior appropriate. No anxious or depressed appearing.      Assessment     Assessment Prediabetes dx- 2012, A1c 6.4 HTN - (HCTZ and sulfa) : anaphylaxis,  Saw cardiology 08/10/2019: --Echo: Mild LVH, grade 1 diastolic dysfunction. --Vascular ultrasound: Mild bilateral stenosis of renal artery.  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 EKG today: NSR, no change from previous. Recent labs satisfactory. Plan: Cleared for surgery, recommend preop labs. HTN: BP today is very good, at home reportedly normal.  Continue Lotensin, metoprolol. OSA?  Saw neurology 11/2020, was Rx a sleep study, patient never pursued.  At this point he admits to some snoring but denies fatigue or falling asleep easily. Renal artery stenosis?  Saw cardiology 2 years ago, a vascular ultrasound show question  of mild bilateral renal artery stenosis, BP is currently well-controlled on 2 drugs.  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 RTC scheduled for 10/16/2022.

## 2022-08-14 NOTE — Assessment & Plan Note (Signed)
Surgical clearance: The patient is 68, currently asymptomatic from the CV standpoint. EKG today: NSR, no change from previous. Recent labs satisfactory. Plan: Cleared for surgery, recommend preop labs. HTN: BP today is very good, at home reportedly normal.  Continue Lotensin, metoprolol. OSA?  Saw neurology 11/2020, was Rx a sleep study, patient never pursued.  At this point he admits to some snoring but denies fatigue or falling asleep easily. Renal artery stenosis?  Saw cardiology 2 years ago, a vascular ultrasound show question of mild bilateral renal artery stenosis, BP is currently well-controlled on 2 drugs.  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 RTC scheduled for 10/16/2022.

## 2022-08-23 NOTE — Pre-Procedure Instructions (Signed)
Surgical Instructions    Your procedure is scheduled on Thursday, August 29, 2022 at 7:30 AM.  Report to [facility] Main Entrance "A" at 5:30 A.M., then check in with the Admitting office.  Call this number if you have problems the morning of surgery:  [phone]   If you have any questions prior to your surgery date call [phone]: Open Monday-Friday 8am-4pm  *If you experience any cold or flu symptoms such as cough, fever, chills, shortness of breath, etc. between now and your scheduled surgery, please notify us.*    Remember:  Do not eat after midnight the night before your surgery  You may drink clear liquids until 4:30 AM the morning of your surgery.   Clear liquids allowed are: Water, Non-Citrus Juices (without pulp), Carbonated Beverages, Clear Tea, Black Coffee Only (NO MILK, CREAM OR POWDERED CREAMER of any kind), and Gatorade.    Take these medicines the morning of surgery with A SIP OF WATER:  methocarbamol (ROBAXIN)  metoprolol succinate (TOPROL-XL)  omeprazole (PRILOSEC)    As of today, STOP taking any Aspirin (unless otherwise instructed by your surgeon) Aleve, Naproxen, Ibuprofen, Motrin, Advil, Goody's, BC's, all herbal medications, fish oil, and all vitamins.  WHAT DO I DO ABOUT MY DIABETES MEDICATION?   HOW TO MANAGE YOUR DIABETES BEFORE AND AFTER SURGERY  Why is it important to control my blood sugar before and after surgery? Improving blood sugar levels before and after surgery helps healing and can limit problems. A way of improving blood sugar control is eating a healthy diet by:  Eating less sugar and carbohydrates  Increasing activity/exercise  Talking with your doctor about reaching your blood sugar goals High blood sugars (greater than 180 mg/dL) can raise your risk of infections and slow your recovery, so you will need to focus on controlling your diabetes during the weeks before surgery. Make sure that the doctor who takes care of your  diabetes knows about your planned surgery including the date and location.  How do I manage my blood sugar before surgery? Check your blood sugar at least 4 times a day, starting 2 days before surgery, to make sure that the level is not too high or low.  Check your blood sugar the morning of your surgery when you wake up and every 2 hours until you get to the Short Stay unit.  If your blood sugar is less than 70 mg/dL, you will need to treat for low blood sugar: Do not take insulin. Treat a low blood sugar (less than 70 mg/dL) with  cup of clear juice (cranberry or apple), 4 glucose tablets, OR glucose gel. Recheck blood sugar in 15 minutes after treatment (to make sure it is greater than 70 mg/dL). If your blood sugar is not greater than 70 mg/dL on recheck, call [phone] for further instructions. Report your blood sugar to the short stay nurse when you get to Short Stay.  If you are admitted to the hospital after surgery: Your blood sugar will be checked by the staff and you will probably be given insulin after surgery (instead of oral diabetes medicines) to make sure you have good blood sugar levels. The goal for blood sugar control after surgery is 80-180 mg/dL.                      Do NOT Smoke (Tobacco/Vaping) for 24 hours prior to your procedure.  If you use a CPAP at night, you may bring your mask/headgear  for your overnight stay.   Contacts, glasses, piercing's, hearing aid's, dentures or partials may not be worn into surgery, please bring cases for these belongings.    For patients admitted to the hospital, discharge time will be determined by your treatment team.   Patients discharged the day of surgery will not be allowed to drive home, and someone needs to stay with them for 24 hours.  SURGICAL WAITING ROOM VISITATION Patients having surgery or a procedure may have two support people in the waiting area. Visitors may stay in the waiting area during the procedure and  switch out with other visitors if needed. Only 1 support person is allowed in the pre-op area with the patient AFTER the patient is prepped. This person cannot be switched out. Children under the age of 16 must have an adult accompany them who is not the patient. If the patient needs to stay at the hospital during part of their recovery, the visitor guidelines for inpatient rooms apply.  Please refer to the [facility] website for the visitor guidelines for Inpatients (after your surgery is over and you are in a regular room).    Special instructions:   [facility]- Preparing For Surgery  Before surgery, you can play an important role. Because skin is not sterile, your skin needs to be as free of germs as possible. You can reduce the number of germs on your skin by washing with CHG (chlorahexidine gluconate) Soap before surgery.  CHG is an antiseptic cleaner which kills germs and bonds with the skin to continue killing germs even after washing.    Oral Hygiene is also important to reduce your risk of infection.  Remember - BRUSH YOUR TEETH THE MORNING OF SURGERY WITH YOUR REGULAR TOOTHPASTE  Please do not use if you have an allergy to CHG or antibacterial soaps. If your skin becomes reddened/irritated stop using the CHG.  Do not shave (including legs and underarms) for at least 48 hours prior to first CHG shower. It is OK to shave your face.  Please follow these instructions carefully.   Shower the NIGHT BEFORE SURGERY and the MORNING OF SURGERY  If you chose to wash your hair, wash your hair first as usual with your normal shampoo.  After you shampoo, rinse your hair and body thoroughly to remove the shampoo.  Use CHG Soap as you would any other liquid soap. You can apply CHG directly to the skin and wash gently with a scrungie or a clean washcloth.   Apply the CHG Soap to your body ONLY FROM THE NECK DOWN.  Do not use on open wounds or open sores. Avoid contact with your eyes, ears, mouth  and genitals (private parts). Wash Face and genitals (private parts)  with your normal soap.   Wash thoroughly, paying special attention to the area where your surgery will be performed.  Thoroughly rinse your body with warm water from the neck down.  DO NOT shower/wash with your normal soap after using and rinsing off the CHG Soap.  Pat yourself dry with a CLEAN TOWEL.  Wear CLEAN PAJAMAS to bed the night before surgery  Place CLEAN SHEETS on your bed the night before your surgery  DO NOT SLEEP WITH PETS.   Day of Surgery: Take a shower with CHG soap. Do not wear jewelry or makeup Do not wear lotions, powders, perfumes/colognes, or deodorant. Do not shave 48 hours prior to surgery.  Men may shave face and neck. Do not wear nail polish,  gel polish, artificial nails, or any other type of covering on natural nails (fingers and toes) If you have artificial nails or gel coating that need to be removed by a nail salon, please have this removed prior to surgery. Artificial nails or gel coating may interfere with anesthesia's ability to adequately monitor your vital signs. Wear Clean/Comfortable clothing the morning of surgery Do not bring valuables to the hospital.  [facility] is not responsible for any belongings or valuables. Remember to brush your teeth WITH YOUR REGULAR TOOTHPASTE.   Please read over the following fact sheets that you were given.  If you received a COVID test during your pre-op visit  it is requested that you wear a mask when out in public, stay away from anyone that may not be feeling well and notify your surgeon if you develop symptoms. If you have been in contact with anyone that has tested positive in the last 10 days please notify you surgeon.

## 2022-08-25 ENCOUNTER — Telehealth: Payer: Managed Care, Other (non HMO) | Admitting: Physician Assistant

## 2022-08-25 DIAGNOSIS — J020 Streptococcal pharyngitis: Secondary | ICD-10-CM

## 2022-08-25 MED ORDER — AMOXICILLIN 500 MG PO CAPS: 500.0000 mg | ORAL_CAPSULE | Freq: Two times a day (BID) | ORAL | 0 refills | Status: AC

## 2022-08-25 NOTE — Patient Instructions (Signed)
[name], thank you for joining Mar Daring, PA-C for today's virtual visit.  While this provider is not your primary care provider (PCP), if your PCP is located in our provider database this encounter information will be shared with them immediately following your visit.   [facility] account gives you access to today's visit and all your visits, tests, and labs performed at [facility] " click here if you don't have a [facility] account or go to [email or web address]  Consent: (Patient) Garrett Bell provided verbal consent for this virtual visit at the beginning of the encounter.  Current Medications:  Current Outpatient Medications:    amoxicillin (AMOXIL) 500 MG capsule, Take 1 capsule (500 mg total) by mouth 2 (two) times daily for 10 days., Disp: 20 capsule, Rfl: 0   benazepril (LOTENSIN) 40 MG tablet, Take 1 tablet (40 mg total) by mouth daily., Disp: 90 tablet, Rfl: 1   methocarbamol (ROBAXIN) 500 MG tablet, Take 500 mg by mouth daily., Disp: , Rfl:    metoprolol succinate (TOPROL-XL) 100 MG 24 hr tablet, Take 1 tablet (100 mg total) by mouth in the morning and at bedtime., Disp: 180 tablet, Rfl: 1   omeprazole (PRILOSEC) 20 MG capsule, Take 20 mg by mouth in the morning., Disp: , Rfl:    Tadalafil 2.5 MG TABS, Take 1 tablet (2.5 mg total) by mouth daily., Disp: 90 tablet, Rfl: 1   TURMERIC PO, Take 1 capsule by mouth in the morning., Disp: , Rfl:    Medications ordered in this encounter:  Meds ordered this encounter  Medications   amoxicillin (AMOXIL) 500 MG capsule    Sig: Take 1 capsule (500 mg total) by mouth 2 (two) times daily for 10 days.    Dispense:  20 capsule    Refill:  0    Order Specific Question:   Supervising Provider    Answer:   Chase Picket [id number]     *If you need refills on other medications prior to your next appointment, please contact your pharmacy*  Follow-Up: Call back or seek an in-person  evaluation if the symptoms worsen or if the condition fails to improve as anticipated.  [place] [phone]  Other Instructions  Strep Throat, Adult Strep throat is an infection in the throat that is caused by bacteria. It is common during the cold months of the year. It mostly affects children who are 21-61 years old. However, people of all ages can get it at any time of the year. This infection spreads from person to person (is contagious) through coughing, sneezing, or having close contact. Your health care provider may use other names to describe the infection. When strep throat affects the tonsils, it is called tonsillitis. When it affects the back of the throat, it is called pharyngitis. What are the causes? This condition is caused by the Streptococcus pyogenes bacteria. What increases the risk? You are more likely to develop this condition if: You care for school-age children, or are around school-age children. Children are more likely to get strep throat and may spread it to others. You spend time in crowded places where the infection can spread easily. You have close contact with someone who has strep throat. What are the signs or symptoms? Symptoms of this condition include: Fever or chills. Redness, swelling, or pain in the tonsils or throat. Pain or difficulty when swallowing. White or yellow spots on the tonsils or throat. Tender glands  in the neck and under the jaw. Bad smelling breath. Red rash all over the body. This is rare. How is this diagnosed? This condition is diagnosed by tests that check for the presence and the amount of bacteria that cause strep throat. They are: Rapid strep test. Your throat is swabbed and checked for the presence of bacteria. Results are usually ready in minutes. Throat culture test. Your throat is swabbed. The sample is placed in a cup that allows infections to grow. Results are usually ready in 1 or 2 days. How is this  treated? This condition may be treated with: Medicines that kill germs (antibiotics). Medicines that relieve pain or fever. These include: Ibuprofen or acetaminophen. Aspirin, only for people who are over the age of 57. Throat lozenges. Throat sprays. Follow these instructions at home: Medicines  Take over-the-counter and prescription medicines only as told by your health care provider. Take your antibiotic medicine as told by your health care provider. Do not stop taking the antibiotic even if you start to feel better. Eating and drinking  If you have trouble swallowing, try eating soft foods until your sore throat feels better. Drink enough fluid to keep your urine pale yellow. To help relieve pain, you may have: Warm fluids, such as soup and tea. Cold fluids, such as frozen desserts or popsicles. General instructions Gargle with a salt-water mixture 3-4 times a day or as needed. To make a salt-water mixture, completely dissolve -1 tsp (3-6 g) of salt in 1 cup (237 mL) of warm water. Get plenty of rest. Stay home from work or school until you have been taking antibiotics for 24 hours. Do not use any products that contain nicotine or tobacco. These products include cigarettes, chewing tobacco, and vaping devices, such as e-cigarettes. If you need help quitting, ask your health care provider. It is up to you to get your test results. Ask your health care provider, or the department that is doing the test, when your results will be ready. Keep all follow-up visits. This is important. How is this prevented?  Do not share food, drinking cups, or personal items that could cause the infection to spread to other people. Wash your hands often with soap and water for at least 20 seconds. If soap and water are not available, use hand sanitizer. Make sure that all people in your house wash their hands well. Have family members tested if they have a sore throat or fever. They may need an  antibiotic if they have strep throat. Contact a health care provider if: You have swelling in your neck that keeps getting bigger. You develop a rash, cough, or earache. You cough up a thick mucus that is green, yellow-brown, or bloody. You have pain or discomfort that does not get better with medicine. Your symptoms seem to be getting worse. You have a fever. Get help right away if: You have new symptoms, such as vomiting, severe headache, stiff or painful neck, chest pain, or shortness of breath. You have severe throat pain, drooling, or changes in your voice. You have swelling of the neck, or the skin on the neck becomes red and tender. You have signs of dehydration, such as tiredness (fatigue), dry mouth, and decreased urination. You become increasingly sleepy, or you cannot wake up completely. Your joints become red or painful. These symptoms may represent a serious problem that is an emergency. Do not wait to see if the symptoms will go away. Get medical help right away. Call  your local emergency services (911 in the [place] Do not drive yourself to the hospital. Summary Strep throat is an infection in the throat that is caused by the Streptococcus pyogenes bacteria. This infection is spread from person to person (is contagious) through coughing, sneezing, or having close contact. Take your medicines, including antibiotics, as told by your health care provider. Do not stop taking the antibiotic even if you start to feel better. To prevent the spread of germs, wash your hands well with soap and water. Have others do the same. Do not share food, drinking cups, or personal items. Get help right away if you have new symptoms, such as vomiting, severe headache, stiff or painful neck, chest pain, or shortness of breath. This information is not intended to replace advice given to you by your health care provider. Make sure you discuss any questions you have with your health care  provider. Document Revised: 11/07/2020 Document Reviewed: 11/07/2020 Elsevier Patient Education  2023 Elsevier Inc.    If you have been instructed to have an in-person evaluation today at a local Urgent Care facility, please use the link below. It will take you to a list of all of our available [facility], including address, phone number and hours of operation. Please do not delay care.  [facility]  If you or a family member do not have a primary care provider, use the link below to schedule a visit and establish care. When you choose a [facility] primary care physician or advanced practice provider, you gain a long-term partner in health. Find a Primary Care Provider  Learn more about [facility]'s in-office and virtual care options:  - Get Care Now

## 2022-08-25 NOTE — Progress Notes (Signed)
Virtual Visit Consent   Garrett Bell, you are scheduled for a virtual visit with a [facility] provider today. Just as with appointments in the office, your consent must be obtained to participate. Your consent will be active for this visit and any virtual visit you may have with one of our providers in the next 365 days. If you have a MyChart account, a copy of this consent can be sent to you electronically.  As this is a virtual visit, video technology does not allow for your provider to perform a traditional examination. This may limit your provider's ability to fully assess your condition. If your provider identifies any concerns that need to be evaluated in person or the need to arrange testing (such as labs, EKG, etc.), we will make arrangements to do so. Although advances in technology are sophisticated, we cannot ensure that it will always work on either your end or our end. If the connection with a video visit is poor, the visit may have to be switched to a telephone visit. With either a video or telephone visit, we are not always able to ensure that we have a secure connection.  By engaging in this virtual visit, you consent to the provision of healthcare and authorize for your insurance to be billed (if applicable) for the services provided during this visit. Depending on your insurance coverage, you may receive a charge related to this service.  I need to obtain your verbal consent now. Are you willing to proceed with your visit today? [name] has provided verbal consent on 08/25/2022 for a virtual visit (video or telephone). Mar Daring, PA-C  Date: 08/25/2022 10:22 AM  Virtual Visit via Video Note   I, Mar Daring, connected with  Garrett Bell  ([id number], 04/23/1976) on 08/25/22 at 10:15 AM EST by a video-enabled telemedicine application and verified that I am speaking with the correct person using two identifiers.  Location: Patient: Virtual Visit  Location Patient: Home Provider: Virtual Visit Location Provider: Home Office   I discussed the limitations of evaluation and management by telemedicine and the availability of in person appointments. The patient expressed understanding and agreed to proceed.    History of Present Illness: Garrett Bell is a 47 y.o. who identifies as a male who was assigned male at birth, and is being seen today for sore throat and feverish symptoms.  HPI: Sore Throat  This is a new problem. The current episode started yesterday. The problem has been rapidly worsening. Maximum temperature: subjective fevers. The fever has been present for Less than 1 day. Associated symptoms include congestion, coughing and headaches. Pertinent negatives include no diarrhea, ear discharge, ear pain, hoarse voice, plugged ear sensation, shortness of breath, swollen glands, trouble swallowing or vomiting. Associated symptoms comments: nausea. He has had exposure to strep. He has had no exposure to mono. Exposure to: wife treated last week for strep. He has tried acetaminophen (sudafed) for the symptoms.     Problems:  Patient Active Problem List   Diagnosis Date Noted   Cervical radiculopathy 06/06/2022   Hemorrhoids 05/08/2021   Achilles tendinosis 12/15/2019   Morbid obesity ([place]) 06/10/2018   PCP NOTES >>>>>>>>>>>>>>>>>>>> 10/31/2015   Skin lesion 12/25/2013   CTS (carpal tunnel syndrome) 08/11/2012   Annual physical exam 08/20/2011   Hyperglycemia 08/20/2011   HEADACHE 06/16/2007   Essential hypertension 04/07/2007   GERD 04/07/2007    Allergies:  Allergies  Allergen Reactions   Hydrochlorothiazide Anaphylaxis  Sulfonamide Derivatives Anaphylaxis   Medications:  Current Outpatient Medications:    amoxicillin (AMOXIL) 500 MG capsule, Take 1 capsule (500 mg total) by mouth 2 (two) times daily for 10 days., Disp: 20 capsule, Rfl: 0   benazepril (LOTENSIN) 40 MG tablet, Take 1 tablet (40 mg total) by mouth  daily., Disp: 90 tablet, Rfl: 1   methocarbamol (ROBAXIN) 500 MG tablet, Take 500 mg by mouth daily., Disp: , Rfl:    metoprolol succinate (TOPROL-XL) 100 MG 24 hr tablet, Take 1 tablet (100 mg total) by mouth in the morning and at bedtime., Disp: 180 tablet, Rfl: 1   omeprazole (PRILOSEC) 20 MG capsule, Take 20 mg by mouth in the morning., Disp: , Rfl:    Tadalafil 2.5 MG TABS, Take 1 tablet (2.5 mg total) by mouth daily., Disp: 90 tablet, Rfl: 1   TURMERIC PO, Take 1 capsule by mouth in the morning., Disp: , Rfl:   Observations/Objective: Patient is well-developed, well-nourished in no acute distress.  Resting comfortably at home.  Head is normocephalic, atraumatic.  No labored breathing.  Speech is clear and coherent with logical content.  Patient is alert and oriented at baseline.    Assessment and Plan: 1. Strep throat - amoxicillin (AMOXIL) 500 MG capsule; Take 1 capsule (500 mg total) by mouth 2 (two) times daily for 10 days.  Dispense: 20 capsule; Refill: 0  - Suspect strep throat - Amoxicillin prescribed - Tylenol and Ibuprofen alternating every 4 hours - Salt water gargles - Chloraseptic spray - Liquid and soft food diet - Push fluids - New toothbrush in 3 days - Seek in person evaluation if not improving or if symptoms worsen   Follow Up Instructions: I discussed the assessment and treatment plan with the patient. The patient was provided an opportunity to ask questions and all were answered. The patient agreed with the plan and demonstrated an understanding of the instructions.  A copy of instructions were sent to the patient via MyChart unless otherwise noted below.    The patient was advised to call back or seek an in-person evaluation if the symptoms worsen or if the condition fails to improve as anticipated.  Time:  I spent 10 minutes with the patient via telehealth technology discussing the above problems/concerns.    Mar Daring, PA-C

## 2022-08-26 ENCOUNTER — Other Ambulatory Visit: Payer: Self-pay

## 2022-08-26 ENCOUNTER — Encounter (HOSPITAL_COMMUNITY): Payer: Self-pay

## 2022-08-26 ENCOUNTER — Encounter (HOSPITAL_COMMUNITY)
Admission: RE | Admit: 2022-08-26 | Discharge: 2022-08-26 | Disposition: A | Discharge status: Home / Self Care | Payer: Managed Care, Other (non HMO) | Source: Ambulatory Visit | Attending: Orthopedic Surgery

## 2022-08-26 VITALS — BP 160/90 | HR 66 | Temp 97.5°F | Resp 18 | Ht 70.0 in | Wt 269.3 lb

## 2022-08-26 DIAGNOSIS — I10 Essential (primary) hypertension: Secondary | ICD-10-CM | POA: Diagnosis not present

## 2022-08-26 DIAGNOSIS — Z01818 Encounter for other preprocedural examination: Secondary | ICD-10-CM

## 2022-08-26 DIAGNOSIS — Z01812 Encounter for preprocedural laboratory examination: Secondary | ICD-10-CM | POA: Diagnosis not present

## 2022-08-26 HISTORY — DX: Unspecified osteoarthritis, unspecified site: M19.90

## 2022-08-26 LAB — BASIC METABOLIC PANEL
Anion gap: 8 (ref 5–15)
BUN: 11 mg/dL (ref 6–20)
CO2: 27 mmol/L (ref 22–32)
Calcium: 9.3 mg/dL (ref 8.9–10.3)
Chloride: 101 mmol/L (ref 98–111)
Creatinine, Ser: 0.91 mg/dL (ref 0.61–1.24)
GFR, Estimated: >60 mL/min (ref >60)
Glucose, Bld: 101 mg/dL — ABNORMAL HIGH (ref 70–99)
Potassium: 3.9 mmol/L (ref 3.5–5.1)
Sodium: 136 mmol/L (ref 135–145)

## 2022-08-26 LAB — CBC
HCT: 48.5 % (ref 39.0–52.0)
Hemoglobin: 16.5 g/dL (ref 13.0–17.0)
MCH: 31 pg (ref 26.0–34.0)
MCHC: 34 g/dL (ref 30.0–36.0)
MCV: 91 fL (ref 80.0–100.0)
Platelets: 167 10*3/uL (ref 150–400)
RBC: 5.33 MIL/uL (ref 4.22–5.81)
RDW: 12.5 % (ref 11.5–15.5)
WBC: 5.9 10*3/uL (ref 4.0–10.5)
nRBC: 0 % (ref 0.0–0.2)

## 2022-08-26 LAB — SURGICAL PCR SCREEN
MRSA, PCR: NEGATIVE
Staphylococcus aureus: POSITIVE — AB

## 2022-08-26 NOTE — Progress Notes (Addendum)
PCP - Dr. Kathlene November Cardiologist - denies  PPM/ICD - denies   Chest x-ray - 01/30/2009 EKG - 08/13/22 Stress Test - denies ECHO - 08/26/19 Cardiac Cath - denies  Sleep Study - denies   DM- denies (Pt states he was considered "prediabetic" years ago around 2012 but has never taken any medications. He lost weight and has not been considered prediabetic since, per pt.    ASA/Blood Thinner Instructions: n/a   ERAS Protcol - yes, no drink   COVID TEST- n/a   Anesthesia review: yes, HTN at PAT   Patient denies shortness of breath, fever, cough and chest pain at PAT appointment   All instructions explained to the patient, with a verbal understanding of the material. Patient agrees to go over the instructions while at home for a better understanding. The opportunity to ask questions was provided.

## 2022-08-27 ENCOUNTER — Encounter (HOSPITAL_COMMUNITY): Payer: Self-pay | Admitting: Physician Assistant

## 2022-08-29 ENCOUNTER — Encounter (HOSPITAL_COMMUNITY): Admission: RE | Payer: Self-pay | Source: Home / Self Care

## 2022-08-29 ENCOUNTER — Ambulatory Visit (HOSPITAL_COMMUNITY)
Admission: RE | Admit: 2022-08-29 | Payer: Managed Care, Other (non HMO) | Source: Home / Self Care | Admitting: Orthopedic Surgery

## 2022-08-29 HISTORY — PX: CERVICAL SPINE SURGERY: SHX589

## 2022-08-29 SURGERY — CERVICAL ANTERIOR DISC ARTHROPLASTY
Anesthesia: General

## 2022-10-24 ENCOUNTER — Encounter: Payer: Self-pay | Admitting: Internal Medicine

## 2022-10-24 ENCOUNTER — Ambulatory Visit: Payer: Managed Care, Other (non HMO) | Admitting: Internal Medicine

## 2022-10-24 VITALS — BP 138/86 | HR 68 | Temp 97.6°F | Resp 18 | Ht 70.0 in | Wt 276.2 lb

## 2022-10-24 DIAGNOSIS — R739 Hyperglycemia, unspecified: Secondary | ICD-10-CM

## 2022-10-24 DIAGNOSIS — I10 Essential (primary) hypertension: Secondary | ICD-10-CM

## 2022-10-24 LAB — BASIC METABOLIC PANEL
BUN: 18 mg/dL (ref 6–23)
CO2: 29 mEq/L (ref 19–32)
Calcium: 9.1 mg/dL (ref 8.4–10.5)
Chloride: 103 mEq/L (ref 96–112)
Creatinine, Ser: 0.95 mg/dL (ref 0.40–1.50)
GFR: 94.3 mL/min (ref >60.00)
Glucose, Bld: 108 mg/dL — ABNORMAL HIGH (ref 70–99)
Potassium: 3.9 mEq/L (ref 3.5–5.1)
Sodium: 139 mEq/L (ref 135–145)

## 2022-10-24 LAB — HEMOGLOBIN A1C: Hgb A1c MFr Bld: 6.1 % (ref 4.6–6.5)

## 2022-10-24 NOTE — Patient Instructions (Addendum)
Vaccines I recommend:  Covid booster    Check the  blood pressure regularly BP GOAL is between 110/65 and  135/85. If it is consistently higher or lower, let me know     GO TO THE LAB : Get the blood work     [place], [place] back for a physical exam in 4 to 5 months

## 2022-10-24 NOTE — Progress Notes (Signed)
   Subjective:    Patient ID: Garrett Bell, male    DOB: [birth year], 47 y.o.   MRN: [id number]  DOS:  10/24/2022 Type of visit - description: f/u  Recently had neck surgery.  Recuperating well. Has not been released to work. Started PT yesterday. Good med compliance.   Review of Systems See above   Past Medical History:  Diagnosis Date   Arthritis    Chest pain    neg stress test 01/2009   Diabetes ([place])    pt states he was prediabetic at one time but due to weight loss has not been considered diabetic and has not taken any medication   GERD (gastroesophageal reflux disease)    Headache(784.0)    HTN (hypertension)    Traumatic partial tear of right biceps tendon 06/2015    Past Surgical History:  Procedure Laterality Date   biceps surgery Right 07-04-15   tendon reattachment   CARPAL TUNNEL RELEASE Bilateral 06/11/2017   HEMORRHOID SURGERY  07-2008   thrombosed   VASECTOMY      Current Outpatient Medications  Medication Instructions   benazepril (LOTENSIN) 40 mg, Oral, Daily   methocarbamol (ROBAXIN) 500 mg, Oral, Daily   metoprolol succinate (TOPROL-XL) 100 mg, Oral, 2 times daily   omeprazole (PRILOSEC) 20 mg, Oral, Every morning   Tadalafil 2.5 mg, Oral, Daily   TURMERIC PO 1 capsule, Oral, Every morning       Objective:   Physical Exam BP 138/86   Pulse 68   Temp 97.6 F (36.4 C) (Oral)   Resp 18   Ht 5\' 10"  (1.778 m)   Wt 276 lb 4 oz (125.3 kg)   SpO2 96%   BMI 39.64 kg/m  General:   Well developed, NAD, BMI noted. HEENT:  Normocephalic . Face symmetric, atraumatic Lungs:  CTA B Normal respiratory effort, no intercostal retractions, no accessory muscle use. Heart: RRR,  no murmur.  Lower extremities: no pretibial edema bilaterally  Skin: Not pale. Not jaundice Neurologic:  alert & oriented X3.  Speech normal, gait appropriate for age and unassisted Psych--  Cognition and judgment appear intact.  Cooperative with normal attention span  and concentration.  Behavior appropriate. No anxious or depressed appearing.      Assessment     Assessment Prediabetes dx- 2012, A1c 6.4 HTN - (HCTZ and sulfa) : anaphylaxis,  Saw cardiology 08/10/2019: --Echo: Mild LVH, grade 1 diastolic dysfunction. 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  Check A1c HTN: On Lotensin, no ambulatory BPs, recommend to start checking, check BMP. Preventive care: Rec COVID booster RTC 4 to 5 months CPX

## 2022-10-25 NOTE — Assessment & Plan Note (Signed)
Prediabetes: d/t recent neck surgery has not been very active, encouraged healthy diet.  Check A1c HTN: On Lotensin, no ambulatory BPs, recommend to start checking, check BMP. Preventive care: Rec COVID booster RTC 4 to 5 months CPX

## 2022-11-10 ENCOUNTER — Other Ambulatory Visit: Payer: Self-pay | Admitting: Internal Medicine

## 2022-11-11 ENCOUNTER — Encounter: Payer: Self-pay | Admitting: *Deleted

## 2022-11-29 ENCOUNTER — Other Ambulatory Visit: Payer: Self-pay | Admitting: Internal Medicine

## 2023-02-06 ENCOUNTER — Encounter: Payer: Self-pay | Admitting: Family Medicine

## 2023-02-06 ENCOUNTER — Ambulatory Visit: Payer: Managed Care, Other (non HMO) | Admitting: Family Medicine

## 2023-02-06 VITALS — BP 138/82 | HR 78 | Temp 98.5°F | Wt 283.0 lb

## 2023-02-06 DIAGNOSIS — Z8614 Personal history of Methicillin resistant Staphylococcus aureus infection: Secondary | ICD-10-CM | POA: Diagnosis not present

## 2023-02-06 DIAGNOSIS — L0291 Cutaneous abscess, unspecified: Secondary | ICD-10-CM | POA: Diagnosis not present

## 2023-02-06 MED ORDER — DOXYCYCLINE HYCLATE 100 MG PO TABS: 100.0000 mg | ORAL_TABLET | Freq: Two times a day (BID) | ORAL | 0 refills | Status: AC

## 2023-02-06 NOTE — Patient Instructions (Signed)
VISIT SUMMARY:  Garrett Bell, you came in today because of a painful lesion on your leg that you've had for a week. You described it as a cyst and mentioned that it has been slightly draining but not growing in size. 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 We discussed your current symptoms and decided on a plan to address this issue.  YOUR PLAN:  -CUTANEOUS ABSCESS ON RIGHT THIGH: This is a painful, draining sore on your right thigh. Given your history of MRSA infections, we are treating it seriously. We are starting you on a course of Doxycycline, an antibiotic, for 7 days. We are also referring you to a surgeon for further evaluation and possible drainage of the abscess. You should follow up with your primary care provider or return to this clinic within a week for a re-check. You can take over-the-counter pain medication as needed for pain control.  INSTRUCTIONS:  Start taking Doxycycline 100mg  twice a day for 7 days. Make an appointment with a surgeon for further evaluation and possible drainage of the abscess. Follow up with your primary care provider or return to this clinic within a week for a re-check. You can take over-the-counter pain medication as needed for pain control.

## 2023-02-06 NOTE — Assessment & Plan Note (Signed)
Assessment and Plan    Cutaneous Abscess, Right Thigh: One week history of a painful, draining abscess on the right thigh. No signs of spreading infection. 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 -Start Doxycycline 100mg  BID for 7 days. -Refer to surgery for evaluation and possible incision and drainage. -Follow up with PCP or return to this clinic within 1 week for re-evaluation. -Over the counter pain medication as needed for pain control.

## 2023-02-06 NOTE — Progress Notes (Signed)
Assessment/Plan:   Problem List Items Addressed This Visit       Other   Abscess - Primary    Assessment and Plan    Cutaneous Abscess, Right Thigh: One week history of a painful, draining abscess on the right thigh. No signs of spreading infection. 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 -Start Doxycycline 100mg  BID for 7 days. -Refer to surgery for evaluation and possible incision and drainage. -Follow up with PCP or return to this clinic within 1 week for re-evaluation. -Over the counter pain medication as needed for pain control.            Relevant Medications   doxycycline (VIBRA-TABS) 100 MG tablet   Other Relevant Orders   Ambulatory referral to General Surgery   History of MRSA infection    There are no discontinued medications.  Return in about 1 week (around 02/13/2023), or if symptoms worsen or fail to improve.    Subjective:   Encounter date: 02/06/2023  Garrett Bell is a 47 y.o. male who has Essential hypertension; GERD; HEADACHE; Annual physical exam; Hyperglycemia; CTS (carpal tunnel syndrome); Skin lesion; PCP NOTES >>>>>>>>>>>>>>>>>>>>; Morbid obesity (HCC); Achilles tendinosis; Hemorrhoids; Cervical radiculopathy; Abscess; and History of MRSA infection on their problem list..   He  has a past medical history of Arthritis, Chest pain, Diabetes (HCC), GERD (gastroesophageal reflux disease), Headache(784.0), HTN (hypertension), and Traumatic partial tear of right biceps tendon (06/2015).Marland Kitchen   He presents with chief complaint of Leg irritation (Left inner upper leg cyst. Ok with ai program) .   Discussed the use of AI scribe software for clinical note transcription with the patient, who gave verbal consent to proceed.  History of Present Illness   The patient, Garrett Bell, presents with a one-week history of a painful lesion on the side of his leg. The lesion, which the patient describes as a cyst, has not increased in size but has remained persistently  painful and sensitive. The patient reports that the lesion has been draining slightly but not extensively. The patient denies any significant changes in the lesion's size or spread, and it has remained localized to the initial area. The patient's spouse has been monitoring the lesion daily to ensure no significant changes.  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 The patient's previous MRSA infections were severe, affecting the entire leg and leaving visible scarring. The patient is aware of the potential complications and severity of MRSA infections, which prompted the current consultation.  The patient's current lesion has been hot to touch, but the heat and inflammation have remained localized. The patient denies any other symptoms or health concerns at this time. The patient's medications and pharmacy details were not discussed during this consultation.       Review of Systems  Constitutional:  Negative for chills and fever.  Skin:  Positive for rash.    Past Surgical History:  Procedure Laterality Date   biceps surgery Right 07-04-15   tendon reattachment   CARPAL TUNNEL RELEASE Bilateral 06/11/2017   HEMORRHOID SURGERY  07-2008   thrombosed   VASECTOMY      Outpatient Medications Prior to Visit  Medication Sig Dispense Refill   benazepril (LOTENSIN) 40 MG tablet Take 1 tablet (40 mg total) by mouth daily. 90 tablet 1   metoprolol succinate (TOPROL-XL) 100 MG 24 hr tablet Take 1 tablet (100 mg  total) by mouth in the morning and at bedtime. 180 tablet 1   omeprazole (PRILOSEC) 20 MG capsule Take 20 mg by mouth in the morning.     Tadalafil 2.5 MG TABS Take 1 tablet (2.5 mg total) by mouth daily. 90 tablet 1   TURMERIC PO Take 1 capsule by mouth in the morning.     methocarbamol (ROBAXIN) 500 MG tablet Take 500 mg by mouth daily. (Patient not taking: Reported on  02/06/2023)     No facility-administered medications prior to visit.    Family History  Problem Relation Age of Onset   Hypertension Father    Stroke Other        GF   Aneurysm Other        GM, brain   CAD Maternal Grandmother    Colon cancer Neg Hx    Prostate cancer Neg Hx     Social History   Socioeconomic History   Marital status: Married    Spouse name: Not on file   Number of children: 6   Years of education: Not on file   Highest education level: GED or equivalent  Occupational History   Occupation: Conservator, museum/gallery   Tobacco Use   Smoking status: Never   Smokeless tobacco: Never  Vaping Use   Vaping status: Never Used  Substance and Sexual Activity   Alcohol use: No   Drug use: No   Sexual activity: Yes  Other Topics Concern   Not on file  Social History Narrative   divorced 2011, 5 children    household is pt, 3 of the children and wife (remarried)   Social Determinants of Health   Financial Resource Strain: Low Risk  (10/23/2022)   Overall Financial Resource Strain (CARDIA)    Difficulty of Paying Living Expenses: Not hard at all  Food Insecurity: No Food Insecurity (10/23/2022)   Hunger Vital Sign    Worried About Running Out of Food in the Last Year: Never true    Ran Out of Food in the Last Year: Never true  Transportation Needs: No Transportation Needs (10/23/2022)   PRAPARE - Administrator, Civil Service (Medical): No    Lack of Transportation (Non-Medical): No  Physical Activity: Unknown (10/23/2022)   Exercise Vital Sign    Days of Exercise per Week: 0 days    Minutes of Exercise per Session: Not on file  Stress: No Stress Concern Present (10/23/2022)   [employer] of Occupational Health - Occupational Stress Questionnaire    Feeling of Stress : Only a little  Social Connections: Moderately Isolated (10/23/2022)   Social Connection and Isolation Panel [NHANES]    Frequency of Communication with Friends and Family: More  than three times a week    Frequency of Social Gatherings with Friends and Family: Once a week    Attends Religious Services: Never    Database administrator or Organizations: No    Attends Engineer, structural: Not on file    Marital Status: Married  Catering manager Violence: Not on file  Objective:  Physical Exam: BP 138/82 (BP Location: Left Arm, Patient Position: Sitting, Cuff Size: Large)   Pulse 78   Temp 98.5 F (36.9 C) (Oral)   Wt 283 lb (128.4 kg)   SpO2 99%   BMI 40.61 kg/m     Physical Exam Constitutional:      Appearance: Normal appearance.  HENT:     Head: Normocephalic and atraumatic.     Right Ear: Hearing normal.     Left Ear: Hearing normal.     Nose: Nose normal.  Eyes:     General: No scleral icterus.       Right eye: No discharge.        Left eye: No discharge.     Extraocular Movements: Extraocular movements intact.  Cardiovascular:     Comments: No cyanosis, no JVD Pulmonary:     Effort: Pulmonary effort is normal.     Comments: No auditory wheezing Musculoskeletal:     Comments: Normal Ambulation. No clubbing  Skin:    General: Skin is warm.     Findings: Abscess present.  Neurological:     General: No focal deficit present.     Mental Status: He is alert.     Cranial Nerves: No cranial nerve deficit.  Psychiatric:        Mood and Affect: Mood normal.        Behavior: Behavior normal.        Thought Content: Thought content normal.        Judgment: Judgment normal.     No results found.  No results found for this or any previous visit (from the past 2160 hour(s)).      Garner Nash, MD, MS

## 2023-02-27 ENCOUNTER — Other Ambulatory Visit: Payer: Self-pay

## 2023-02-27 MED ORDER — BENAZEPRIL HCL 40 MG PO TABS: 40.0000 mg | ORAL_TABLET | Freq: Every day | ORAL | 0 refills | Status: DC

## 2023-03-07 ENCOUNTER — Encounter: Payer: Managed Care, Other (non HMO) | Admitting: Internal Medicine

## 2023-03-11 ENCOUNTER — Ambulatory Visit (INDEPENDENT_AMBULATORY_CARE_PROVIDER_SITE_OTHER): Payer: Managed Care, Other (non HMO) | Admitting: Internal Medicine

## 2023-03-11 ENCOUNTER — Encounter: Payer: Self-pay | Admitting: Internal Medicine

## 2023-03-11 VITALS — BP 152/82 | HR 70 | Temp 97.9°F | Resp 18 | Ht 70.0 in | Wt 286.0 lb

## 2023-03-11 DIAGNOSIS — Z0000 Encounter for general adult medical examination without abnormal findings: Secondary | ICD-10-CM

## 2023-03-11 DIAGNOSIS — R7989 Other specified abnormal findings of blood chemistry: Secondary | ICD-10-CM

## 2023-03-11 DIAGNOSIS — R739 Hyperglycemia, unspecified: Secondary | ICD-10-CM

## 2023-03-11 DIAGNOSIS — I10 Essential (primary) hypertension: Secondary | ICD-10-CM

## 2023-03-11 NOTE — Patient Instructions (Addendum)
Return the stool test at any location in the next few days  Vaccines I recommend: Flu shot and COVID booster this fall  Check the  blood pressure regularly Blood pressure goal:  between 110/65 and  135/85. If it is consistently higher or lower, let me know      GO TO THE LAB : Get the blood work     GO TO THE FRONT DESK, PLEASE SCHEDULE YOUR APPOINTMENTS Come back for a checkup in 4 months   Please read:  This is a summary of the advice provided today.  Please read if you have questions about today's visit.  You can always call us back for more information.  If you have MyChart, please use it. Let the APP send you alerts and  reminders.   If you are not planning to use MyChart, please deactivate it.  If you have it and don't use it, you won't see my comments about your results.  If I order blood work, x-rays or referrals and you do not see your results or don't hear about the referrals within a week:  please call my office.

## 2023-03-11 NOTE — Progress Notes (Unsigned)
Subjective:    Patient ID: Garrett Bell, male    DOB: 14-Dec-1975, 47 y.o.   MRN: [id number]  DOS:  03/11/2023 Type of visit - description: CPX  Here for CPX.  Feeling well.  Wt Readings from Last 3 Encounters:  03/11/23 286 lb (129.7 kg)  02/06/23 283 lb (128.4 kg)  10/24/22 276 lb 4 oz (125.3 kg)  \  Review of Systems   A 14 point review of systems is negative    Past Medical History:  Diagnosis Date   Arthritis    Chest pain    neg stress test 01/2009   Diabetes (HCC)    pt states he was prediabetic at one time but due to weight loss has not been considered diabetic and has not taken any medication   GERD (gastroesophageal reflux disease)    Headache(784.0)    HTN (hypertension)    Traumatic partial tear of right biceps tendon 06/2015    Past Surgical History:  Procedure Laterality Date   biceps surgery Right 07/04/2015   tendon reattachment   CARPAL TUNNEL RELEASE Bilateral 06/11/2017   CERVICAL SPINE SURGERY  08/2022   Dr Shon Baton   HEMORRHOID SURGERY  07/2008   thrombosed   VASECTOMY     Social History   Social History Narrative   divorced 2011, 5 children    household is pt, wife, 1 child   Pt's father as well           Current Outpatient Medications  Medication Instructions   benazepril (LOTENSIN) 40 mg, Oral, Daily   metoprolol succinate (TOPROL-XL) 100 mg, Oral, 2 times daily   omeprazole (PRILOSEC) 20 mg, Oral, Every morning   Tadalafil 2.5 mg, Oral, Daily   TURMERIC PO 1 capsule, Oral, Every morning       Objective:   Physical Exam BP (!) 152/82 (BP Location: Left Arm, Patient Position: Sitting, Cuff Size: Large)   Pulse 70   Temp 97.9 F (36.6 C) (Temporal)   Resp 18   Ht 5\' 10"  (1.778 m)   Wt 286 lb (129.7 kg)   SpO2 99%   BMI 41.04 kg/m  General: Well developed, NAD, BMI noted Neck: No  thyromegaly  HEENT:  Normocephalic . Face symmetric, atraumatic Lungs:  CTA B Normal respiratory effort, no intercostal retractions,  no accessory muscle use. Heart: RRR,  no murmur.  Abdomen:  Not distended, soft, non-tender. No rebound or rigidity.   Lower extremities: no pretibial edema bilaterally  Skin: Exposed areas without rash. Not pale. Not jaundice Neurologic:  alert & oriented X3.  Speech normal, gait appropriate for age and unassisted Strength symmetric and appropriate for age.  Psych: Cognition and judgment appear intact.  Cooperative with normal attention span and concentration.  Behavior appropriate. No anxious or depressed appearing.     Assessment    Assessment Prediabetes dx- 2012, A1c 6.4 HTN - (HCTZ and sulfa) : anaphylaxis,  Saw cardiology 08/10/2019: --Echo: Mild LVH, grade 1 diastolic dysfunction. 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  Not making much progress.  Dietary advice provided.  Recommend to come back in 4 months for further advice Prediabetes: Checking A1c HTN: BP today is elevated, recheck: 152/82,  he reports BPs are consistently normal in the 130/80 range both at home and at work. Recommend to continue checking twice weekly, call if not at goal. Morbid obesity: Recommend to come back in 4 months to discuss pharmacological therapy. Neck surgery: Few months ago, feeling well, feels fully recuperated RTC 4 months

## 2023-03-12 ENCOUNTER — Encounter: Payer: Self-pay | Admitting: Internal Medicine

## 2023-03-12 NOTE — Assessment & Plan Note (Signed)
Here for CPX Prediabetes: Checking A1c HTN: BP today is elevated, recheck: 152/82,  he reports BPs are consistently normal in the 130/80 range both at home and at work. Recommend to continue checking twice weekly, call if not at goal. Morbid obesity: Recommend to come back in 4 months to discuss pharmacological therapy. Neck surgery: Few months ago, feeling well, feels fully recuperated RTC 4 months

## 2023-03-12 NOTE — Assessment & Plan Note (Signed)
Here for CPX - Td 2019 - vaccines I recommend: flu shot  every fall; a Covid booster - CCS: 3 options discussed, elected iFOB - Labs: CMP FLP CBC A1c TSH free T4 - Diet exercise: No much of a change lately.  Not making much progress.  Dietary advice provided.  Recommend to come back in 4 months for further advice

## 2023-05-18 ENCOUNTER — Other Ambulatory Visit: Payer: Self-pay | Admitting: Internal Medicine

## 2023-05-25 ENCOUNTER — Other Ambulatory Visit: Payer: Self-pay | Admitting: Internal Medicine

## 2023-07-08 DIAGNOSIS — S52121A Displaced fracture of head of right radius, initial encounter for closed fracture: Secondary | ICD-10-CM | POA: Insufficient documentation

## 2023-07-11 ENCOUNTER — Emergency Department (HOSPITAL_BASED_OUTPATIENT_CLINIC_OR_DEPARTMENT_OTHER)
Admission: EM | Admit: 2023-07-11 | Discharge: 2023-07-12 | Disposition: A | Discharge status: Home / Self Care | Payer: Managed Care, Other (non HMO) | Attending: Emergency Medicine | Admitting: Emergency Medicine

## 2023-07-11 ENCOUNTER — Encounter (HOSPITAL_BASED_OUTPATIENT_CLINIC_OR_DEPARTMENT_OTHER): Payer: Self-pay | Admitting: Emergency Medicine

## 2023-07-11 ENCOUNTER — Other Ambulatory Visit: Payer: Self-pay

## 2023-07-11 DIAGNOSIS — S301XXA Contusion of abdominal wall, initial encounter: Secondary | ICD-10-CM | POA: Diagnosis present

## 2023-07-11 DIAGNOSIS — I10 Essential (primary) hypertension: Secondary | ICD-10-CM | POA: Insufficient documentation

## 2023-07-11 DIAGNOSIS — W19XXXA Unspecified fall, initial encounter: Secondary | ICD-10-CM | POA: Insufficient documentation

## 2023-07-11 LAB — CBC WITH DIFFERENTIAL/PLATELET
Abs Immature Granulocytes: 0.04 10*3/uL (ref 0.00–0.07)
Basophils Absolute: 0.1 10*3/uL (ref 0.0–0.1)
Basophils Relative: 1 %
Eosinophils Absolute: 0.2 10*3/uL (ref 0.0–0.5)
Eosinophils Relative: 3 %
HCT: 40.2 % (ref 39.0–52.0)
Hemoglobin: 13.5 g/dL (ref 13.0–17.0)
Immature Granulocytes: 1 %
Lymphocytes Relative: 34 %
Lymphs Abs: 2.4 10*3/uL (ref 0.7–4.0)
MCH: 29.9 pg (ref 26.0–34.0)
MCHC: 33.6 g/dL (ref 30.0–36.0)
MCV: 88.9 fL (ref 80.0–100.0)
Monocytes Absolute: 0.8 10*3/uL (ref 0.1–1.0)
Monocytes Relative: 11 %
Neutro Abs: 3.5 10*3/uL (ref 1.7–7.7)
Neutrophils Relative %: 50 %
Platelets: 170 10*3/uL (ref 150–400)
RBC: 4.52 MIL/uL (ref 4.22–5.81)
RDW: 13 % (ref 11.5–15.5)
WBC: 6.9 10*3/uL (ref 4.0–10.5)
nRBC: 0 % (ref 0.0–0.2)

## 2023-07-11 NOTE — ED Triage Notes (Signed)
Fall on Monday. Fall onto [employer] at work. Noticed large bruising on lower abdomen around to back. Tender and painful

## 2023-07-11 NOTE — ED Provider Notes (Signed)
[place] EMERGENCY DEPARTMENT AT [facility] Provider Note   CSN: [id number] Arrival date & time: 07/11/23  2215     History {Add pertinent medical, surgical, social history, OB history to HPI:1} Chief Complaint  Patient presents with   Garrett Bell is a 47 y.o. male.  Patient with history of hypertension here with abdominal pain and bruising.  He had a mechanical fall 5 days ago striking his abdomen on a pallet jack at work.  He fell onto his outstretched arm and fractured his elbow.  He has already had surgery by Dr. Orlan Leavens.  He was seen in urgent care.  Over the past few days he has noticed increased pain, swelling, bruising to his abdomen that radiates to his bilateral flanks and back.  It is painful and sore to palpation.  He did not have this addressed at urgent care because it was not bothering him initially.  No blood thinner use.  No pain with urination or blood in the urine.  No blood in the stool.  No vomiting.  No fever.  No testicular pain.  No chest pain or shortness of breath.  Did not hit his head when he fell.  The history is provided by the patient.  Fall Associated symptoms include abdominal pain. Pertinent negatives include no chest pain, no headaches and no shortness of breath.       Home Medications Prior to Admission medications   Medication Sig Start Date End Date Taking? Authorizing Provider  benazepril (LOTENSIN) 40 MG tablet Take 1 tablet (40 mg total) by mouth daily. 05/26/23   Wanda Plump, MD  metoprolol succinate (TOPROL-XL) 100 MG 24 hr tablet Take 1 tablet (100 mg total) by mouth in the morning and at bedtime. 05/26/23   Wanda Plump, MD  omeprazole (PRILOSEC) 20 MG capsule Take 20 mg by mouth in the morning.    [provider]  Tadalafil 2.5 MG TABS Take 1 tablet (2.5 mg total) by mouth daily. 05/19/23   Wanda Plump, MD  TURMERIC PO Take 1 capsule by mouth in the morning.    [provider]      Allergies     Hydrochlorothiazide and Sulfonamide derivatives    Review of Systems   Review of Systems  Constitutional:  Negative for activity change, appetite change and fever.  HENT:  Negative for congestion and nosebleeds.   Respiratory:  Negative for cough, chest tightness and shortness of breath.   Cardiovascular:  Negative for chest pain.  Gastrointestinal:  Positive for abdominal pain. Negative for nausea and vomiting.  Genitourinary:  Negative for dysuria and hematuria.  Musculoskeletal:  Negative for arthralgias and myalgias.  Skin:  Positive for wound.  Neurological:  Negative for dizziness, weakness and headaches.   all other systems are negative except as noted in the HPI and PMH.    Physical Exam Updated Vital Signs BP (!) 186/121 (BP Location: Left Arm)   Pulse 71   Temp 98.6 F (37 C)   Resp 20   SpO2 97%  Physical Exam Vitals and nursing note reviewed.  Constitutional:      General: He is not in acute distress.    Appearance: He is well-developed.  HENT:     Head: Normocephalic and atraumatic.     Mouth/Throat:     Pharynx: No oropharyngeal exudate.  Eyes:     Conjunctiva/sclera: Conjunctivae normal.     Pupils: Pupils are equal, round, and reactive to  light.  Neck:     Comments: No meningismus. Cardiovascular:     Rate and Rhythm: Normal rate and regular rhythm.     Heart sounds: Normal heart sounds. No murmur heard. Pulmonary:     Effort: Pulmonary effort is normal. No respiratory distress.     Breath sounds: Normal breath sounds.  Abdominal:     Palpations: Abdomen is soft.     Tenderness: There is abdominal tenderness. There is no guarding or rebound.     Comments: Extensive bruising, tenderness, ecchymosis across lower abdomen as depicted.  No peritoneal signs.  Musculoskeletal:        General: No tenderness. Normal range of motion.     Cervical back: Normal range of motion and neck supple.     Comments: Right arm in a short arm splint and sling, fingers  well-perfused  Skin:    General: Skin is warm.  Neurological:     Mental Status: He is alert and oriented to person, place, and time.     Cranial Nerves: No cranial nerve deficit.     Motor: No abnormal muscle tone.     Coordination: Coordination normal.     Comments:  5/5 strength throughout. CN 2-12 intact.Equal grip strength.   Psychiatric:        Behavior: Behavior normal.          ED Results / Procedures / Treatments   Labs (all labs ordered are listed, but only abnormal results are displayed) Labs Reviewed  CBC WITH DIFFERENTIAL/PLATELET  COMPREHENSIVE METABOLIC PANEL  URINALYSIS, ROUTINE W REFLEX MICROSCOPIC  LIPASE, BLOOD    EKG None  Radiology No results found.  Procedures Procedures  {Document cardiac monitor, telemetry assessment procedure when appropriate:1}  Medications Ordered in ED Medications - No data to display  ED Course/ Medical Decision Making/ A&P   {   Click here for ABCD2, HEART and other calculatorsREFRESH Note before signing :1}                              Medical Decision Making Amount and/or Complexity of Data Reviewed Independent Historian: spouse Labs: ordered. Decision-making details documented in ED Course. Radiology: ordered and independent interpretation performed. Decision-making details documented in ED Course. ECG/medicine tests: ordered and independent interpretation performed. Decision-making details documented in ED Course.   Fall 5 days ago with extensive bruising, pain and ecchymosis to abdomen.  Stable vital signs, hypertensive on arrival.  No blood thinner use.  He had a "shattered elbow" that was already repaired by Dr. Orlan Leavens.  No records available. {Document critical care time when appropriate:1} {Document review of labs and clinical decision tools ie heart score, Chads2Vasc2 etc:1}  {Document your independent review of radiology images, and any outside records:1} {Document your discussion with family members,  caretakers, and with consultants:1} {Document social determinants of health affecting pt's care:1} {Document your decision making why or why not admission, treatments were needed:1} Final Clinical Impression(s) / ED Diagnoses Final diagnoses:  None    Rx / DC Orders ED Discharge Orders     None

## 2023-07-12 ENCOUNTER — Emergency Department (HOSPITAL_BASED_OUTPATIENT_CLINIC_OR_DEPARTMENT_OTHER): Payer: Managed Care, Other (non HMO)

## 2023-07-12 LAB — URINALYSIS, ROUTINE W REFLEX MICROSCOPIC
Bilirubin Urine: NEGATIVE
Glucose, UA: NEGATIVE mg/dL
Hgb urine dipstick: NEGATIVE
Ketones, ur: NEGATIVE mg/dL
Leukocytes,Ua: NEGATIVE
Nitrite: NEGATIVE
Protein, ur: NEGATIVE mg/dL
Specific Gravity, Urine: 1.024 (ref 1.005–1.030)
pH: 6 (ref 5.0–8.0)

## 2023-07-12 LAB — COMPREHENSIVE METABOLIC PANEL
ALT: 21 U/L (ref 0–44)
AST: 18 U/L (ref 15–41)
Albumin: 3.9 g/dL (ref 3.5–5.0)
Alkaline Phosphatase: 41 U/L (ref 38–126)
Anion gap: 7 (ref 5–15)
BUN: 18 mg/dL (ref 6–20)
CO2: 30 mmol/L (ref 22–32)
Calcium: 9.3 mg/dL (ref 8.9–10.3)
Chloride: 102 mmol/L (ref 98–111)
Creatinine, Ser: 0.84 mg/dL (ref 0.61–1.24)
GFR, Estimated: >60 mL/min (ref >60)
Glucose, Bld: 107 mg/dL — ABNORMAL HIGH (ref 70–99)
Potassium: 3.7 mmol/L (ref 3.5–5.1)
Sodium: 139 mmol/L (ref 135–145)
Total Bilirubin: 0.6 mg/dL (ref <1.2)
Total Protein: 6.6 g/dL (ref 6.5–8.1)

## 2023-07-12 LAB — LIPASE, BLOOD: Lipase: 40 U/L (ref 11–51)

## 2023-07-12 MED ORDER — IOHEXOL 300 MG/ML  SOLN
100.0000 mL | Freq: Once | INTRAMUSCULAR | Status: AC | PRN
  Administered 2023-07-12: 100 mL via INTRAVENOUS

## 2023-07-12 NOTE — Discharge Instructions (Addendum)
Your Testing shows no significant injury inside your abdomen or pelvis.  There is a large area of bleeding and bruising to your abdominal wall.  Use ice and anti-inflammatories as prescribed and follow-up with your doctor for recheck next week.  Return to the ED with new or worsening symptoms.

## 2023-07-21 ENCOUNTER — Ambulatory Visit (INDEPENDENT_AMBULATORY_CARE_PROVIDER_SITE_OTHER): Payer: Managed Care, Other (non HMO) | Admitting: Internal Medicine

## 2023-07-21 ENCOUNTER — Encounter: Payer: Self-pay | Admitting: Internal Medicine

## 2023-07-21 VITALS — BP 136/82 | HR 59 | Temp 98.0°F | Resp 16 | Ht 70.0 in | Wt 288.5 lb

## 2023-07-21 DIAGNOSIS — R739 Hyperglycemia, unspecified: Secondary | ICD-10-CM | POA: Diagnosis not present

## 2023-07-21 DIAGNOSIS — I10 Essential (primary) hypertension: Secondary | ICD-10-CM

## 2023-07-21 NOTE — Progress Notes (Signed)
Subjective:    Patient ID: Garrett Bell, male    DOB: 1975/08/17, 47 y.o.   MRN: [id number]  DOS:  07/21/2023 Type of visit - description: follow-up  Chronic medical problems addressed.  Also, had a mechanical fall approximately 07/05/2023, broke his R elbow, saw Ortho, had surgery  Went to the ER 07/11/2023 due to increased pain swelling and bruising of the abdomen since the fall. Workup:  CMP, CBC normal.  Lipase normal.  Urinalysis with no hemoglobin.   CT abdomen and pelvis: Abdominal wall hematoma on the left 2 x 2.8 x 7.2 cm.    Wt Readings from Last 3 Encounters:  07/21/23 288 lb 8 oz (130.9 kg)  03/11/23 286 lb (129.7 kg)  02/06/23 283 lb (128.4 kg)    BP Readings from Last 3 Encounters:  07/21/23 136/82  07/12/23 (!) 157/101  03/11/23 (!) 152/82     Review of Systems At this point he is feeling well. Minimal pain at the elbow. Lump of the abdominal wall has not changed  Past Medical History:  Diagnosis Date   Arthritis    Chest pain    neg stress test 01/2009   Diabetes (HCC)    pt states he was prediabetic at one time but due to weight loss has not been considered diabetic and has not taken any medication   GERD (gastroesophageal reflux disease)    Headache(784.0)    HTN (hypertension)    Traumatic partial tear of right biceps tendon 06/2015    Past Surgical History:  Procedure Laterality Date   biceps surgery Right 07/04/2015   tendon reattachment   CARPAL TUNNEL RELEASE Bilateral 06/11/2017   CERVICAL SPINE SURGERY  08/2022   Dr Shon Baton   ELBOW SURGERY Right    HEMORRHOID SURGERY  07/2008   thrombosed   VASECTOMY      Current Outpatient Medications  Medication Instructions   benazepril (LOTENSIN) 40 mg, Oral, Daily   metoprolol succinate (TOPROL-XL) 100 mg, Oral, 2 times daily   omeprazole (PRILOSEC) 20 mg, Every morning   Tadalafil 2.5 mg, Oral, Daily   TURMERIC PO 1 capsule, Every morning       Objective:   Physical  Exam Abdominal:       Comments: Firm, nontender hematoma noted to the left abdomen.  Some bruise proximal from the hematoma noted    BP 136/82   Pulse (!) 59   Temp 98 F (36.7 C) (Oral)   Resp 16   Ht 5\' 10"  (1.778 m)   Wt 288 lb 8 oz (130.9 kg)   SpO2 97%   BMI 41.40 kg/m  General:   Well developed, NAD, BMI noted.  HEENT:  Normocephalic . Face symmetric, atraumatic Lungs:  CTA B Normal respiratory effort, no intercostal retractions, no accessory muscle use. Heart: RRR,  no murmur.  Abdomen:  Not distended, see graphic.   Skin: Not pale. Not jaundice Lower extremities: no pretibial edema bilaterally  Neurologic:  alert & oriented X3.  Speech normal, gait appropriate for age and unassisted Psych--  Cognition and judgment appear intact.  Cooperative with normal attention span and concentration.  Behavior appropriate. No anxious or depressed appearing.     Assessment     ssessment Prediabetes dx- 2012, A1c 6.4 HTN - (HCTZ and sulfa) : anaphylaxis,  Saw cardiology 08/10/2019: --Echo: Mild LVH, grade 1 diastolic dysfunction. --Vascular ultrasound: Mild bilateral stenosis of renal artery.  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  Encouraged to be as structured as possible, encouraged to increase physical activity. Morbid obesity: See above HTN: BP was elevated at the urgent care because he was in pain otherwise BPs look okay.  No change.  Recent creatinine okay. Hematoma, R elbow: Iced will take several weeks to shrink, call if he gets red, warm or more tender. Right elbow fracture: Per Ortho Preventive care: Had a flu shot, COVID booster recommended. RTC 4 months

## 2023-07-21 NOTE — Patient Instructions (Addendum)
Vaccines I recommend: Covid booster    Check the  blood pressure regularly Blood pressure goal:  between 110/65 and  135/85. If it is consistently higher or lower, let me know  Next visit with me  in 4 months    Please schedule it at the front desk

## 2023-07-21 NOTE — Assessment & Plan Note (Signed)
Prediabetes: Plans to restart healthier diet after Christmas.  Encouraged to be as structured as possible, encouraged to increase physical activity. Morbid obesity: See above HTN: BP was elevated at the urgent care because he was in pain otherwise BPs look okay.  No change.  Recent creatinine okay. Hematoma, R elbow: Iced will take several weeks to shrink, call if he gets red, warm or more tender. Right elbow fracture: Per Ortho Preventive care: Had a flu shot, COVID booster recommended. RTC 4 months

## 2023-11-18 ENCOUNTER — Other Ambulatory Visit: Payer: Self-pay | Admitting: Internal Medicine

## 2023-11-21 ENCOUNTER — Ambulatory Visit: Payer: Managed Care, Other (non HMO) | Admitting: Internal Medicine

## 2023-11-21 ENCOUNTER — Other Ambulatory Visit: Payer: Self-pay | Admitting: Internal Medicine

## 2023-11-21 ENCOUNTER — Encounter: Payer: Self-pay | Admitting: Internal Medicine

## 2023-11-21 VITALS — BP 126/84 | HR 64 | Temp 98.0°F | Resp 16 | Ht 70.0 in | Wt 286.2 lb

## 2023-11-21 DIAGNOSIS — I10 Essential (primary) hypertension: Secondary | ICD-10-CM | POA: Diagnosis not present

## 2023-11-21 DIAGNOSIS — R739 Hyperglycemia, unspecified: Secondary | ICD-10-CM

## 2023-11-21 LAB — BASIC METABOLIC PANEL WITH GFR
BUN: 19 mg/dL (ref 6–23)
CO2: 32 meq/L (ref 19–32)
Calcium: 9.3 mg/dL (ref 8.4–10.5)
Chloride: 102 meq/L (ref 96–112)
Creatinine, Ser: 1.01 mg/dL (ref 0.40–1.50)
GFR: 86.96 mL/min (ref >60.00)
Glucose, Bld: 116 mg/dL — ABNORMAL HIGH (ref 70–99)
Potassium: 4.4 meq/L (ref 3.5–5.1)
Sodium: 139 meq/L (ref 135–145)

## 2023-11-21 LAB — HEMOGLOBIN A1C: Hgb A1c MFr Bld: 6.4 % (ref 4.6–6.5)

## 2023-11-21 NOTE — Patient Instructions (Addendum)
 INSTRUCTIONS  FOR TODAY   Think about weight loss medicine: Qsymia? Zepbound (similar to Ozempic ) Check the [facility] Website    Diet: weight watchers?   Check the  blood pressure regularly Blood pressure goal:  between 110/65 and  135/85. If it is consistently higher or lower, let me know     GO TO THE LAB : Get the blood work     Next office visit for a physical exam by 02/2024 Please make an appointment before you leave today

## 2023-11-21 NOTE — Progress Notes (Signed)
   Subjective:    Patient ID: Garrett Bell, male    DOB: Jan 25, 1976, 48 y.o.   MRN: [id number]  DOS:  11/21/2023 Type of visit - description: follow up  Chronic medical problems addressed. Feeling well.   Wt Readings from Last 3 Encounters:  11/21/23 286 lb 4 oz (129.8 kg)  07/21/23 288 lb 8 oz (130.9 kg)  03/11/23 286 lb (129.7 kg)    Review of Systems See above   Past Medical History:  Diagnosis Date   Arthritis    Chest pain    neg stress test 01/2009   Diabetes (HCC)    pt states he was prediabetic at one time but due to weight loss has not been considered diabetic and has not taken any medication   GERD (gastroesophageal reflux disease)    Headache(784.0)    HTN (hypertension)    Traumatic partial tear of right biceps tendon 06/2015    Past Surgical History:  Procedure Laterality Date   biceps surgery Right 07/04/2015   tendon reattachment   CARPAL TUNNEL RELEASE Bilateral 06/11/2017   CERVICAL SPINE SURGERY  08/2022   Dr Vaughn Georges   ELBOW SURGERY Right    HEMORRHOID SURGERY  07/2008   thrombosed   VASECTOMY      Current Outpatient Medications  Medication Instructions   benazepril  (LOTENSIN ) 40 mg, Oral, Daily   metoprolol  succinate (TOPROL -XL) 100 mg, Oral, Daily, Take with or immediately following a meal   omeprazole (PRILOSEC) 20 mg, Every morning   Tadalafil  2.5 mg, Oral, Daily   TURMERIC PO 1 capsule, Every morning      Objective:   Physical Exam BP 126/84   Pulse 64   Temp 98 F (36.7 C) (Oral)   Resp 16   Ht 5\' 10"  (1.778 m)   Wt 286 lb 4 oz (129.8 kg)   SpO2 97%   BMI 41.07 kg/m  General:   Well developed, NAD, BMI noted. HEENT:  Normocephalic . Face symmetric, atraumatic Lungs:  CTA B Normal respiratory effort, no intercostal retractions, no accessory muscle use. Heart: RRR,  no murmur.  Lower extremities: no pretibial edema bilaterally  Skin: Not pale. Not jaundice Neurologic:  alert & oriented X3.  Speech normal, gait  appropriate for age and unassisted Psych--  Cognition and judgment appear intact.  Cooperative with normal attention span and concentration.  Behavior appropriate. No anxious or depressed appearing.      Assessment    Problem list Prediabetes dx- 2012, A1c 6.4 HTN - (HCTZ and sulfa) : anaphylaxis,  Saw cardiology 08/10/2019: --Echo: Mild LVH, grade 1 diastolic dysfunction. 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  In addition to structured diet and remain active with her, medication. Ozempic  previously caused nausea.  He could consider Zepbound, Qsymia.  Encouraged to read about it. RTC 4 months CPX

## 2023-11-23 NOTE — Assessment & Plan Note (Signed)
 Prediabetes: Check A1c HTN: BP is okay today, continue Lotensin , metoprolol , check BMP, recommend ambulatory BPs Morbid obesity: Not making progress.  In addition to structured diet and remain active with her, medication. Ozempic  previously caused nausea.  He could consider Zepbound, Qsymia.  Encouraged to read about it. RTC 4 months CPX

## 2023-11-25 ENCOUNTER — Encounter: Payer: Self-pay | Admitting: Internal Medicine

## 2024-01-27 ENCOUNTER — Encounter: Payer: Self-pay | Admitting: Internal Medicine

## 2024-01-27 ENCOUNTER — Ambulatory Visit: Payer: Self-pay

## 2024-01-27 ENCOUNTER — Ambulatory Visit (INDEPENDENT_AMBULATORY_CARE_PROVIDER_SITE_OTHER): Admitting: Family Medicine

## 2024-01-27 ENCOUNTER — Other Ambulatory Visit (HOSPITAL_COMMUNITY)
Admission: RE | Admit: 2024-01-27 | Discharge: 2024-01-27 | Disposition: A | Discharge status: Home / Self Care | Source: Ambulatory Visit | Attending: Family Medicine | Admitting: Family Medicine

## 2024-01-27 ENCOUNTER — Encounter: Payer: Self-pay | Admitting: Family Medicine

## 2024-01-27 VITALS — BP 148/100 | HR 93 | Temp 98.2°F | Resp 18 | Ht 70.0 in | Wt 281.4 lb

## 2024-01-27 DIAGNOSIS — Z113 Encounter for screening for infections with a predominantly sexual mode of transmission: Secondary | ICD-10-CM | POA: Diagnosis present

## 2024-01-27 DIAGNOSIS — N50811 Right testicular pain: Secondary | ICD-10-CM | POA: Diagnosis present

## 2024-01-27 DIAGNOSIS — N454 Abscess of epididymis or testis: Secondary | ICD-10-CM | POA: Insufficient documentation

## 2024-01-27 DIAGNOSIS — R509 Fever, unspecified: Secondary | ICD-10-CM | POA: Diagnosis not present

## 2024-01-27 LAB — POC URINALSYSI DIPSTICK (AUTOMATED)
Bilirubin, UA: NEGATIVE
Blood, UA: NEGATIVE
Glucose, UA: NEGATIVE
Ketones, UA: NEGATIVE
Leukocytes, UA: NEGATIVE
Nitrite, UA: NEGATIVE
Protein, UA: NEGATIVE
Spec Grav, UA: 1.02 (ref 1.010–1.025)
Urobilinogen, UA: 0.2 U/dL
pH, UA: 5 (ref 5.0–8.0)

## 2024-01-27 MED ORDER — DOXYCYCLINE HYCLATE 100 MG PO TABS: 100.0000 mg | ORAL_TABLET | Freq: Two times a day (BID) | ORAL | 1 refills | Status: DC

## 2024-01-27 MED ORDER — CEFTRIAXONE SODIUM 1 G IJ SOLR
1.0000 g | Freq: Once | INTRAMUSCULAR | Status: AC
  Administered 2024-01-27: 1 g via INTRAMUSCULAR

## 2024-01-27 NOTE — Patient Instructions (Signed)
 Testicular abscess--- we have placed a referral to urology You received a shot of rocephin  today --- an antibiotic and a prescription for doxycycline  was sent to your pharmacy.  If your pain worsens or you con't to spike fevers -- you should go to the ER.

## 2024-01-27 NOTE — Telephone Encounter (Signed)
 FYI Only or Action Required?: FYI only for provider.  Patient was last seen in primary care on 11/21/2023 by Amon Aloysius BRAVO, MD. Called Nurse Triage reporting Cyst. Symptoms began x 3 days ago. Interventions attempted: Nothing. Symptoms are: gradually worsening.  Triage Disposition: See PCP When Office is Open (Within 3 Days)  Patient/caregiver understands and will follow disposition?: Yes     Copied from CRM [id number]. Topic: Clinical - Red Word Triage >> Jan 27, 2024  1:04 PM Robinson H wrote: [facility] that prompted transfer to Nurse Triage: cyst on right testicle, pain, fatigue, body aches, fever   ----------------------------------------------------------------------- From previous Reason for Contact - Scheduling: Patient/patient representative is calling to schedule an appointment. Refer to attachments for appointment information. Reason for Disposition  [1] Fever comes and goes (intermittent) AND [2] lasts > 3 weeks  [1] Small swelling or lump AND [2] unexplained AND [3] present > 1 week  Answer Assessment - Initial Assessment Questions 1. APPEARANCE of SWELLING: What does it look like?     Redness, swelling to right testicle 2. SIZE: How large is the swelling? (e.g., inches, cm; or compare to size of pinhead, tip of pen, eraser, coin, pea, grape, ping pong ball)      Golf ball 3. LOCATION: Where is the swelling located?     right testicle, 4. ONSET: When did the swelling start?     X 1 week 5. COLOR: What color is it? Is there more than one color?     N/a 6. PAIN: Is there any pain? If Yes, ask: How bad is the pain? (e.g., scale 1-10; or mild, moderate, severe)     - NONE (0): no pain   - MILD (1-3): doesn't interfere with normal activities    - MODERATE (4-7): interferes with normal activities or awakens from sleep    - SEVERE (8-10): excruciating pain, unable to do any normal activities     Mild to moderate 7. ITCH: Does it itch? If Yes, ask: How bad is  the itch?      no 8. CAUSE: What do you think caused the swelling? 9 OTHER SYMPTOMS: Do you have any other symptoms? (e.g., fever)     Fever  Pt states he had several of these cysts in the past: never on his testicle - usually on legs  Answer Assessment - Initial Assessment Questions 1. TEMPERATURE: What is the most recent temperature?  How was it measured?      Unknown - warm to touch 2. ONSET: When did the fever start?      X 3 days 3. CHILLS: Do you have chills? If yes: How bad are they?  (e.g., none, mild, moderate, severe)   - NONE: no chills   - MILD: feeling cold   - MODERATE: feeling very cold, some shivering (feels better under a thick blanket)   - SEVERE: feeling extremely cold with shaking chills (general body shaking, rigors; even under a thick blanket)      Mild to moderate 4. OTHER SYMPTOMS: Do you have any other symptoms besides the fever?  (e.g., abdomen pain, cough, diarrhea, earache, headache, sore throat, urination pain)     N/a 5. CAUSE: If there are no symptoms, ask: What do you think is causing the fever?      cyst 6. CONTACTS: Does anyone else in the family have an infection?     N/a 7. TREATMENT: What have you done so far to treat this fever? (e.g., medications)  na 8. IMMUNOCOMPROMISE: Do you have of the following: diabetes, HIV positive, splenectomy, cancer chemotherapy, chronic steroid treatment, transplant patient, etc.     na 9. PREGNANCY: Is there any chance you are pregnant? When was your last menstrual period?     na 10. TRAVEL: Have you traveled out of the country in the last month? (e.g., travel history, exposures)       na  Protocols used: Skin Lump or Localized Swelling-A-AH, Fever-A-AH

## 2024-01-27 NOTE — Progress Notes (Signed)
 Established Patient Office Visit  Subjective   Patient ID: Garrett Bell, male    DOB: [birth year]  Age: 48 y.o. MRN: [id number]  Chief Complaint  Patient presents with   Testicle Pain    Right side, sxs started 1 week ago and states having pain for 3 days    HPI Discussed the use of AI scribe software for clinical note transcription with the patient, who gave verbal consent to proceed.  History of Present Illness Garrett Bell is a 48 year old male who presents with a painful cyst on his testicle.  He has a painful cyst on his testicle, which he describes as similar to a previous cyst he had on his inner leg. The current cyst is causing significant pain.  He has been experiencing fevers over the last couple of days, although he has not measured his temperature. He reports symptoms of sweating and subsequently feeling cold. No issues with urination.  Today, he notes the onset of body aches, which he states is a new symptom. No other symptoms such as congestion.   Patient Active Problem List   Diagnosis Date Noted   Fracture of head of right radius 07/08/2023   History of MRSA infection 02/06/2023   Cervical radiculopathy 06/06/2022   Hemorrhoids 05/08/2021   Morbid obesity (HCC) 06/10/2018   PCP NOTES >>>>>>>>>>>>>>>>>>>> 10/31/2015   Skin lesion 12/25/2013   CTS (carpal tunnel syndrome) 08/11/2012   Annual physical exam 08/20/2011   Hyperglycemia 08/20/2011   Headache 06/16/2007   Essential hypertension 04/07/2007   GERD 04/07/2007   Past Medical History:  Diagnosis Date   Arthritis    Chest pain    neg stress test 01/2009   Diabetes (HCC)    pt states he was prediabetic at one time but due to weight loss has not been considered diabetic and has not taken any medication   GERD (gastroesophageal reflux disease)    Headache(784.0)    HTN (hypertension)    Traumatic partial tear of right biceps tendon 06/2015   Past Surgical History:  Procedure Laterality  Date   biceps surgery Right 07/04/2015   tendon reattachment   CARPAL TUNNEL RELEASE Bilateral 06/11/2017   CERVICAL SPINE SURGERY  08/2022   Dr Burnetta   ELBOW SURGERY Right    HEMORRHOID SURGERY  07/2008   thrombosed   VASECTOMY     Social History   Tobacco Use   Smoking status: Never   Smokeless tobacco: Never  Vaping Use   Vaping status: Never Used  Substance Use Topics   Alcohol use: No   Drug use: No   Social History   Socioeconomic History   Marital status: Married    Spouse name: Not on file   Number of children: 6   Years of education: Not on file   Highest education level: 12th grade  Occupational History   Occupation: Conservator, museum/gallery   Tobacco Use   Smoking status: Never   Smokeless tobacco: Never  Vaping Use   Vaping status: Never Used  Substance and Sexual Activity   Alcohol use: No   Drug use: No   Sexual activity: Yes  Other Topics Concern   Not on file  Social History Narrative   divorced 2011, 5 children    household is pt, wife, 1 child   Pt's father as well          Social Drivers of Corporate investment banker Strain: Low Risk  (10/23/2022)  Overall Financial Resource Strain (CARDIA)    Difficulty of Paying Living Expenses: Not hard at all  Food Insecurity: No Food Insecurity (10/23/2022)   Hunger Vital Sign    Worried About Running Out of Food in the Last Year: Never true    Ran Out of Food in the Last Year: Never true  Transportation Needs: No Transportation Needs (07/16/2023)   PRAPARE - Administrator, Civil Service (Medical): No    Lack of Transportation (Non-Medical): No  Physical Activity: Unknown (07/16/2023)   Exercise Vital Sign    Days of Exercise per Week: 0 days    Minutes of Exercise per Session: Not on file  Stress: No Stress Concern Present (07/16/2023)   [employer] of Occupational Health - Occupational Stress Questionnaire    Feeling of Stress : Not at all  Social Connections: Moderately  Isolated (07/16/2023)   Social Connection and Isolation Panel    Frequency of Communication with Friends and Family: Three times a week    Frequency of Social Gatherings with Friends and Family: Once a week    Attends Religious Services: Never    Database administrator or Organizations: No    Attends Engineer, structural: Not on file    Marital Status: Married  Catering manager Violence: Not on file   Family Status  Relation Name Status   Father  (Not Specified)   Other  (Not Specified)   Other  (Not Specified)   MGM  (Not Specified)   Neg Hx  (Not Specified)  No partnership data on file   Family History  Problem Relation Age of Onset   Hypertension Father    Stroke Other        GF   Aneurysm Other        GM, brain   CAD Maternal Grandmother    Colon cancer Neg Hx    Prostate cancer Neg Hx    Allergies  Allergen Reactions   Hydrochlorothiazide Anaphylaxis   Sulfonamide Derivatives Anaphylaxis      Review of Systems  Constitutional:  Positive for chills and fever. Negative for malaise/fatigue.  HENT:  Negative for congestion.   Eyes:  Negative for blurred vision.  Respiratory:  Negative for shortness of breath.   Cardiovascular:  Negative for chest pain, palpitations and leg swelling.  Gastrointestinal:  Negative for abdominal pain, blood in stool and nausea.  Genitourinary:  Negative for dysuria, flank pain, frequency, hematuria and urgency.  Musculoskeletal:  Negative for falls.  Skin:  Negative for rash.  Neurological:  Negative for dizziness, loss of consciousness and headaches.  Endo/Heme/Allergies:  Negative for environmental allergies.  Psychiatric/Behavioral:  Negative for depression. The patient is not nervous/anxious.       Objective:     BP (!) 148/100 (BP Location: Left Arm, Patient Position: Sitting, Cuff Size: Large)   Pulse 93   Temp 98.2 F (36.8 C) (Oral)   Resp 18   Ht 5' 10 (1.778 m)   Wt 281 lb 6.4 oz (127.6 kg)   SpO2 97%    BMI 40.38 kg/m  BP Readings from Last 3 Encounters:  01/27/24 (!) 148/100  11/21/23 126/84  07/21/23 136/82   Wt Readings from Last 3 Encounters:  01/27/24 281 lb 6.4 oz (127.6 kg)  11/21/23 286 lb 4 oz (129.8 kg)  07/21/23 288 lb 8 oz (130.9 kg)   SpO2 Readings from Last 3 Encounters:  01/27/24 97%  11/21/23 97%  07/21/23 97%  Physical Exam Vitals and nursing note reviewed. Exam conducted with a chaperone present.  Genitourinary:    Penis: No erythema, tenderness or swelling.      Testes:        Right: Tenderness and swelling present.      Comments: + large swelling / abscess r testicle no d/c Tender to touch     Results for orders placed or performed in visit on 01/27/24  POCT Urinalysis Dipstick (Automated)  Result Value Ref Range   Color, UA yellow    Clarity, UA clear    Glucose, UA Negative Negative   Bilirubin, UA negative    Ketones, UA negative    Spec Grav, UA 1.020 1.010 - 1.025   Blood, UA negative    pH, UA 5.0 5.0 - 8.0   Protein, UA Negative Negative   Urobilinogen, UA 0.2 0.2 or 1.0 E.U./dL   Nitrite, UA negative    Leukocytes, UA Negative Negative    Last CBC Lab Results  Component Value Date   WBC 6.9 07/11/2023   HGB 13.5 07/11/2023   HCT 40.2 07/11/2023   MCV 88.9 07/11/2023   MCH 29.9 07/11/2023   RDW 13.0 07/11/2023   PLT 170 07/11/2023   Last metabolic panel Lab Results  Component Value Date   GLUCOSE 116 (H) 11/21/2023   NA 139 11/21/2023   K 4.4 11/21/2023   CL 102 11/21/2023   CO2 32 11/21/2023   BUN 19 11/21/2023   CREATININE 1.01 11/21/2023   GFR 86.96 11/21/2023   CALCIUM  9.3 11/21/2023   PROT 6.6 07/11/2023   ALBUMIN 3.9 07/11/2023   LABGLOB 2.7 08/19/2019   AGRATIO 1.6 08/19/2019   BILITOT 0.6 07/11/2023   ALKPHOS 41 07/11/2023   AST 18 07/11/2023   ALT 21 07/11/2023   ANIONGAP 7 07/11/2023   Last lipids Lab Results  Component Value Date   CHOL 224 (H) 03/11/2023   HDL 37.90 (L) 03/11/2023    LDLCALC 150 (H) 03/11/2023   LDLDIRECT 141.8 02/22/2011   TRIG 181.0 (H) 03/11/2023   CHOLHDL 6 03/11/2023   Last hemoglobin A1c Lab Results  Component Value Date   HGBA1C 6.4 11/21/2023   Last thyroid  functions Lab Results  Component Value Date   TSH 5.50 03/11/2023   T3TOTAL 121 11/20/2018   T4TOTAL 5.5 11/20/2018   THYROIDAB 1 01/18/2021   Last vitamin D No results found for: 25OHVITD2, 25OHVITD3, VD25OH Last vitamin B12 and Folate No results found for: VITAMINB12, FOLATE    The 10-year ASCVD risk score (Arnett DK, et al., 2019) is: 8.1%    Assessment & Plan:   Problem List Items Addressed This Visit   None Visit Diagnoses       Testicular abscess    -  Primary   Relevant Medications   cefTRIAXone  (ROCEPHIN ) injection 1 g (Completed)   doxycycline  (VIBRA -TABS) 100 MG tablet   Other Relevant Orders   Ambulatory referral to Urology   POCT Urinalysis Dipstick (Automated) (Completed)   CBC with Differential/Platelet   Comprehensive metabolic panel with GFR   Urine cytology ancillary only     Assessment and Plan Assessment & Plan Testicular abscess   He presents with a large abscess on the right testicle, likely causing fever and body aches. Reports recent fever, sweating, chills, and body aches, but no urinary symptoms. The abscess is acute and requires further evaluation and management. Order urinalysis to rule out associated urinary tract infection. Consider further imaging or surgical consultation for abscess management.  Urology referral placed     No follow-ups on file.    Ivery Michalski R Lowne Chase, DO

## 2024-01-28 LAB — CBC WITH DIFFERENTIAL/PLATELET
Basophils Absolute: 0.1 10*3/uL (ref 0.0–0.1)
Basophils Relative: 0.6 % (ref 0.0–3.0)
Eosinophils Absolute: 0.1 10*3/uL (ref 0.0–0.7)
Eosinophils Relative: 0.6 % (ref 0.0–5.0)
HCT: 44.2 % (ref 39.0–52.0)
Hemoglobin: 14.8 g/dL (ref 13.0–17.0)
Lymphocytes Relative: 18.6 % (ref 12.0–46.0)
Lymphs Abs: 1.8 10*3/uL (ref 0.7–4.0)
MCHC: 33.5 g/dL (ref 30.0–36.0)
MCV: 90.5 fl (ref 78.0–100.0)
Monocytes Absolute: 0.9 10*3/uL (ref 0.1–1.0)
Monocytes Relative: 9.3 % (ref 3.0–12.0)
Neutro Abs: 6.7 10*3/uL (ref 1.4–7.7)
Neutrophils Relative %: 70.9 % (ref 43.0–77.0)
Platelets: 164 10*3/uL (ref 150.0–400.0)
RBC: 4.89 Mil/uL (ref 4.22–5.81)
RDW: 13.6 % (ref 11.5–15.5)
WBC: 9.5 10*3/uL (ref 4.0–10.5)

## 2024-01-28 LAB — COMPREHENSIVE METABOLIC PANEL WITH GFR
ALT: 19 U/L (ref 0–53)
AST: 18 U/L (ref 0–37)
Albumin: 4.4 g/dL (ref 3.5–5.2)
Alkaline Phosphatase: 58 U/L (ref 39–117)
BUN: 13 mg/dL (ref 6–23)
CO2: 31 meq/L (ref 19–32)
Calcium: 9.6 mg/dL (ref 8.4–10.5)
Chloride: 98 meq/L (ref 96–112)
Creatinine, Ser: 1.02 mg/dL (ref 0.40–1.50)
GFR: 85.83 mL/min (ref >60.00)
Glucose, Bld: 86 mg/dL (ref 70–99)
Potassium: 4.3 meq/L (ref 3.5–5.1)
Sodium: 138 meq/L (ref 135–145)
Total Bilirubin: 0.5 mg/dL (ref 0.2–1.2)
Total Protein: 7.2 g/dL (ref 6.0–8.3)

## 2024-01-28 MED ORDER — PHENTERMINE-TOPIRAMATE ER 3.75-23 MG PO CP24: ORAL_CAPSULE | ORAL | 0 refills | Status: DC

## 2024-02-02 ENCOUNTER — Ambulatory Visit: Payer: Self-pay | Admitting: Family Medicine

## 2024-02-02 LAB — URINE CYTOLOGY ANCILLARY ONLY
Chlamydia: NEGATIVE
Comment: NEGATIVE
Comment: NEGATIVE
Comment: NORMAL
Neisseria Gonorrhea: NEGATIVE
Trichomonas: NEGATIVE

## 2024-03-24 ENCOUNTER — Ambulatory Visit: Admitting: Internal Medicine

## 2024-03-24 ENCOUNTER — Encounter: Payer: Self-pay | Admitting: Internal Medicine

## 2024-03-24 VITALS — BP 136/82 | HR 86 | Temp 97.7°F | Resp 16 | Ht 70.0 in | Wt 285.5 lb

## 2024-03-24 DIAGNOSIS — R739 Hyperglycemia, unspecified: Secondary | ICD-10-CM | POA: Diagnosis not present

## 2024-03-24 DIAGNOSIS — Z0000 Encounter for general adult medical examination without abnormal findings: Secondary | ICD-10-CM | POA: Diagnosis not present

## 2024-03-24 DIAGNOSIS — R7989 Other specified abnormal findings of blood chemistry: Secondary | ICD-10-CM

## 2024-03-24 DIAGNOSIS — I10 Essential (primary) hypertension: Secondary | ICD-10-CM | POA: Diagnosis not present

## 2024-03-24 DIAGNOSIS — Z6841 Body Mass Index (BMI) 40.0 and over, adult: Secondary | ICD-10-CM

## 2024-03-24 NOTE — Progress Notes (Unsigned)
 Subjective:    Patient ID: Garrett Bell, male    DOB: May 08, 1976, 48 y.o.   MRN: [id number]  DOS:  03/24/2024 Type of visit - description: CPX Here for CPX Chronic medical problems addressed. Had a scrotal infection, seen by one of my partners, symptoms quickly resolved with antibiotics.  Area is back to normal per patient.  Review of Systems  Other than above, a 14 point review of systems is negative     Past Medical History:  Diagnosis Date   Arthritis    Chest pain    neg stress test 01/2009   Diabetes (HCC)    pt states he was prediabetic at one time but due to weight loss has not been considered diabetic and has not taken any medication   GERD (gastroesophageal reflux disease)    Headache(784.0)    HTN (hypertension)    Traumatic partial tear of right biceps tendon 06/2015    Past Surgical History:  Procedure Laterality Date   biceps surgery Right 07/04/2015   tendon reattachment   CARPAL TUNNEL RELEASE Bilateral 06/11/2017   CERVICAL SPINE SURGERY  08/2022   Dr Burnetta   ELBOW SURGERY Right    HEMORRHOID SURGERY  07/2008   thrombosed   VASECTOMY      Current Outpatient Medications  Medication Instructions   benazepril  (LOTENSIN ) 40 mg, Oral, Daily   metoprolol  succinate (TOPROL -XL) 100 mg, Oral, Daily, Take with or immediately following a meal   omeprazole (PRILOSEC) 20 mg, Every morning   Tadalafil  2.5 mg, Oral, Daily   TURMERIC PO 1 capsule, Every morning       Objective:   Physical Exam BP 136/82   Pulse 86   Temp 97.7 F (36.5 C) (Oral)   Resp 16   Ht 5' 10 (1.778 m)   Wt 285 lb 8 oz (129.5 kg)   SpO2 96%   BMI 40.96 kg/m  General: Well developed, NAD, BMI noted Neck: No  thyromegaly  HEENT:  Normocephalic . Face symmetric, atraumatic Lungs:  CTA B Normal respiratory effort, no intercostal retractions, no accessory muscle use. Heart: RRR,  no murmur.  Abdomen:  Not distended, soft, non-tender. No rebound or rigidity.   Lower  extremities: no pretibial edema bilaterally GU: Scrotum normal without redness or openings.  Scrotal contents: Bilateral testicular exam normal.  No tender, no swelling. Skin: Exposed areas without rash. Not pale. Not jaundice Neurologic:  alert & oriented X3.  Speech normal, gait appropriate for age and unassisted Strength symmetric and appropriate for age.  Psych: Cognition and judgment appear intact.  Cooperative with normal attention span and concentration.  Behavior appropriate. No anxious or depressed appearing.     Assessment     Problem list Prediabetes dx- 2012, A1c 6.4 HTN - (HCTZ and sulfa) : anaphylaxis,  -Saw cardiology 08/10/2019: --Echo: Mild LVH, grade 1 diastolic dysfunction. --Vascular ultrasound: Mild bilateral stenosis of renal artery.  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  He took and Ifob but if  decide to do something different he will let me know - prostate ca screening:  no FH, no sxs.  Check PSA - Labs: FLP A1c TSH free T4 PSA  - Diet exercise see comments under morbid obesity.  Other issues discussed today Boil: The patient had an infection at the right scrotum, he described infection as a boil, it quickly responded to antibiotics.  Today testicular exam is normal.  No further evaluation needed at this time Prediabetes: Last A1c 6.4.  On no meds.  Check labs. HTN: Ambulatory BPs 130/80 when checked, currently on Lotensin , metoprolol .  Last BMP okay. Morbid obesity: Attempted to prescribe Qsymia , could not afford it.  Offered a referral to a  weight weight management center.  Encouraged heart healthy diet.  Also, at this point he denies any major problems with fatigue or snoring. RTC 4 months depending on results.

## 2024-03-24 NOTE — Patient Instructions (Signed)
 Vaccines to consider: Flu shot every fall COVID booster  Watch your diet closely, try to exercise at least 3 hours a week. We could refer you to a weight management center.  Check the  blood pressure regularly Blood pressure goal:  between 110/65 and  135/85. If it is consistently higher or lower, let me know     GO TO THE LAB :  Get the blood work   Your results will be posted on MyChart with my comments  Go to the front desk for the checkout Please make an appointment for a checkup in 4 months.

## 2024-03-25 ENCOUNTER — Encounter: Payer: Self-pay | Admitting: Internal Medicine

## 2024-03-25 NOTE — Assessment & Plan Note (Signed)
 Here for CPX - Td 2019 - vaccines I recommend: PNM 20 (declined), flu shot  every fall; a Covid booster - CCS: Last year he elected a iFOB, was negative, 3 options discussed again.  He took and Ifob but if  decide to do something different he will let me know - prostate ca screening: no FH, no sxs.  Check PSA - Labs: FLP A1c TSH free T4 PSA  - Diet exercise see comments under morbid obesity.

## 2024-03-25 NOTE — Assessment & Plan Note (Signed)
  Here for CPX Other issues discussed today Boil: The patient had an infection at the right scrotum, he described infection as a boil, it quickly responded to antibiotics.  Today testicular exam is normal.  No further evaluation needed at this time Prediabetes: Last A1c 6.4.  On no meds.  Check labs. HTN: Ambulatory BPs 130/80 when checked, currently on Lotensin , metoprolol .  Last BMP okay. Morbid obesity: Attempted to prescribe Qsymia , could not afford it.  Offered a referral to a weight weight management center.  Encouraged heart healthy diet.  Also, at this point he denies any major problems with fatigue or snoring. RTC 4 months depending on results.

## 2024-04-02 ENCOUNTER — Encounter: Admitting: Internal Medicine

## 2024-05-12 ENCOUNTER — Other Ambulatory Visit: Payer: Self-pay | Admitting: Internal Medicine

## 2024-05-23 ENCOUNTER — Other Ambulatory Visit: Payer: Self-pay | Admitting: Internal Medicine

## 2024-05-31 ENCOUNTER — Encounter: Payer: Self-pay | Admitting: Internal Medicine

## 2024-06-01 ENCOUNTER — Other Ambulatory Visit: Payer: Self-pay | Admitting: Family

## 2024-06-01 MED ORDER — METOPROLOL SUCCINATE ER 100 MG PO TB24: 100.0000 mg | ORAL_TABLET | Freq: Every day | ORAL | 1 refills | Status: AC

## 2024-07-28 ENCOUNTER — Ambulatory Visit: Admitting: Internal Medicine

## 2024-09-21 ENCOUNTER — Ambulatory Visit: Admitting: Internal Medicine
